# Patient Record
Sex: Female | Born: 1960
Health system: Southern US, Community
[De-identification: ages and names within clinical notes are randomized; demographics above are authoritative.]

## PROBLEM LIST (undated history)

## (undated) DIAGNOSIS — K589 Irritable bowel syndrome without diarrhea: Secondary | ICD-10-CM

## (undated) DIAGNOSIS — J329 Chronic sinusitis, unspecified: Secondary | ICD-10-CM

## (undated) DIAGNOSIS — K8020 Calculus of gallbladder without cholecystitis without obstruction: Secondary | ICD-10-CM

## (undated) DIAGNOSIS — T7840XA Allergy, unspecified, initial encounter: Secondary | ICD-10-CM

## (undated) HISTORY — DX: Allergy, unspecified, initial encounter: T78.40XA

## (undated) HISTORY — DX: Irritable bowel syndrome, unspecified: K58.9

## (undated) HISTORY — DX: Chronic sinusitis, unspecified: J32.9

## (undated) HISTORY — DX: Calculus of gallbladder without cholecystitis without obstruction: K80.20

---

## 1998-09-11 ENCOUNTER — Other Ambulatory Visit: Admission: RE | Admit: 1998-09-11 | Discharge: 1998-09-11 | Payer: Self-pay | Admitting: Obstetrics and Gynecology

## 1999-11-20 ENCOUNTER — Encounter: Payer: Self-pay | Admitting: Orthopaedic Surgery

## 1999-11-20 ENCOUNTER — Ambulatory Visit (HOSPITAL_COMMUNITY): Admission: RE | Admit: 1999-11-20 | Discharge: 1999-11-20 | Payer: Self-pay | Admitting: Orthopaedic Surgery

## 2000-09-19 ENCOUNTER — Other Ambulatory Visit: Admission: RE | Admit: 2000-09-19 | Discharge: 2000-09-19 | Payer: Self-pay | Admitting: Obstetrics and Gynecology

## 2000-10-29 ENCOUNTER — Ambulatory Visit (HOSPITAL_COMMUNITY): Admission: RE | Admit: 2000-10-29 | Discharge: 2000-10-29 | Payer: Self-pay | Admitting: Obstetrics and Gynecology

## 2000-10-29 ENCOUNTER — Encounter: Payer: Self-pay | Admitting: Obstetrics and Gynecology

## 2001-03-24 ENCOUNTER — Other Ambulatory Visit: Admission: RE | Admit: 2001-03-24 | Discharge: 2001-03-24 | Payer: Self-pay | Admitting: Obstetrics and Gynecology

## 2002-01-08 ENCOUNTER — Other Ambulatory Visit: Admission: RE | Admit: 2002-01-08 | Discharge: 2002-01-08 | Payer: Self-pay | Admitting: Obstetrics and Gynecology

## 2002-02-03 ENCOUNTER — Encounter: Payer: Self-pay | Admitting: Obstetrics and Gynecology

## 2002-02-03 ENCOUNTER — Ambulatory Visit (HOSPITAL_COMMUNITY): Admission: RE | Admit: 2002-02-03 | Discharge: 2002-02-03 | Payer: Self-pay | Admitting: Obstetrics and Gynecology

## 2002-09-13 ENCOUNTER — Other Ambulatory Visit: Admission: RE | Admit: 2002-09-13 | Discharge: 2002-09-13 | Payer: Self-pay | Admitting: Obstetrics and Gynecology

## 2003-09-19 ENCOUNTER — Other Ambulatory Visit: Admission: RE | Admit: 2003-09-19 | Discharge: 2003-09-19 | Payer: Self-pay | Admitting: Obstetrics and Gynecology

## 2004-03-13 ENCOUNTER — Ambulatory Visit (HOSPITAL_COMMUNITY): Admission: RE | Admit: 2004-03-13 | Discharge: 2004-03-13 | Payer: Self-pay | Admitting: Obstetrics and Gynecology

## 2004-07-06 ENCOUNTER — Other Ambulatory Visit: Admission: RE | Admit: 2004-07-06 | Discharge: 2004-07-06 | Payer: Self-pay | Admitting: Obstetrics and Gynecology

## 2004-10-24 ENCOUNTER — Ambulatory Visit: Payer: Self-pay | Admitting: Family Medicine

## 2004-11-28 ENCOUNTER — Ambulatory Visit: Payer: Self-pay | Admitting: Family Medicine

## 2004-12-13 ENCOUNTER — Ambulatory Visit: Payer: Self-pay | Admitting: Family Medicine

## 2005-04-03 ENCOUNTER — Other Ambulatory Visit: Admission: RE | Admit: 2005-04-03 | Discharge: 2005-04-03 | Payer: Self-pay | Admitting: Obstetrics and Gynecology

## 2005-05-29 ENCOUNTER — Ambulatory Visit: Payer: Self-pay | Admitting: Family Medicine

## 2005-06-10 ENCOUNTER — Ambulatory Visit: Payer: Self-pay | Admitting: Family Medicine

## 2005-10-16 ENCOUNTER — Ambulatory Visit: Payer: Self-pay | Admitting: Family Medicine

## 2006-04-22 ENCOUNTER — Ambulatory Visit: Payer: Self-pay | Admitting: Family Medicine

## 2006-08-07 ENCOUNTER — Ambulatory Visit: Payer: Self-pay | Admitting: Family Medicine

## 2007-05-08 ENCOUNTER — Telehealth: Payer: Self-pay | Admitting: *Deleted

## 2007-05-13 DIAGNOSIS — J45909 Unspecified asthma, uncomplicated: Secondary | ICD-10-CM | POA: Insufficient documentation

## 2007-05-13 DIAGNOSIS — J309 Allergic rhinitis, unspecified: Secondary | ICD-10-CM | POA: Insufficient documentation

## 2007-07-10 ENCOUNTER — Ambulatory Visit: Payer: Self-pay | Admitting: Family Medicine

## 2008-05-02 ENCOUNTER — Telehealth: Payer: Self-pay | Admitting: Family Medicine

## 2009-07-24 ENCOUNTER — Ambulatory Visit: Payer: Self-pay | Admitting: Family Medicine

## 2009-07-24 DIAGNOSIS — G2581 Restless legs syndrome: Secondary | ICD-10-CM

## 2009-07-25 ENCOUNTER — Telehealth: Payer: Self-pay | Admitting: Family Medicine

## 2009-08-31 ENCOUNTER — Telehealth: Payer: Self-pay | Admitting: Family Medicine

## 2009-11-13 ENCOUNTER — Telehealth: Payer: Self-pay | Admitting: Family Medicine

## 2010-01-15 ENCOUNTER — Ambulatory Visit: Payer: Self-pay | Admitting: Family Medicine

## 2010-04-06 ENCOUNTER — Telehealth: Payer: Self-pay | Admitting: Family Medicine

## 2010-07-10 ENCOUNTER — Telehealth: Payer: Self-pay | Admitting: Family Medicine

## 2010-08-16 ENCOUNTER — Ambulatory Visit: Payer: Self-pay | Admitting: Family Medicine

## 2010-09-14 ENCOUNTER — Telehealth: Payer: Self-pay | Admitting: *Deleted

## 2010-10-02 NOTE — Progress Notes (Signed)
Summary: hydrocodone syrup refill  Phone Note From Pharmacy   Summary of Call: patient is requesting a refill of hydrocodone syrup is this okay to fill? Initial call taken by: Kern Reap CMA Duncan Dull),  April 06, 2010 11:21 AM  Follow-up for Phone Call        Hydromet dispense 4 ounces directions one half to 1 teaspoon nightly p.r.n. for cough, refills x 1 Follow-up by: Roderick Pee MD,  April 06, 2010 11:46 AM    Prescriptions: Heath Lark 5-1.5 MG/5ML SYRP (HYDROCODONE-HOMATROPINE) one - two teaspoons q hs as needed cough  #8oz x 1   Entered by:   Kern Reap CMA (AAMA)   Authorized by:   Roderick Pee MD   Signed by:   Kern Reap CMA (AAMA) on 04/06/2010   Method used:   Historical   RxID:   [id number]

## 2010-10-02 NOTE — Progress Notes (Signed)
Summary: cough syrup  Phone Note Call from Patient   Caller: Patient Call For: Roderick Pee MD Reason for Call: Acute Illness Summary of Call: Pt is complaining of respiratory  alllergies.  Is taking Prednisone that she had left from other bouts, but wants Hydrocodone cough syrup.  [facility]  [phone] Initial call taken by: Lynann Beaver CMA,  November 13, 2009 4:11 PM  Follow-up for Phone Call        Hydromet dispensed 4 ounces directions one teaspoon nightly p.r.n. for cough, refills x 1 Follow-up by: Roderick Pee MD,  November 13, 2009 5:45 PM    Prescriptions: HYDROMET 5-1.5 MG/5ML SYRP (HYDROCODONE-HOMATROPINE) one - two teaspoons q hs as needed cough  #4 oz x 1   Entered by:   Lynann Beaver CMA   Authorized by:   Roderick Pee MD   Signed by:   Lynann Beaver CMA on 11/14/2009   Method used:   Telephoned to ...       [facility]  [place] [address] (retail)       [address]       [place], [place]  [address]       Ph: [phone] or [phone]       Fax: [phone]   RxID:   [id number]  Pt notified.

## 2010-10-02 NOTE — Assessment & Plan Note (Signed)
Summary: ?sinus inf/njr   Vital Signs:  Patient profile:   50 year old female Weight:      164 pounds Temp:     97.9 degrees F oral BP sitting:   120 / 80  (left arm) Cuff size:   regular  Vitals Entered By: Kern Reap CMA Duncan Dull) (Jan 15, 2010 3:43 PM) CC: sinus infection   CC:  sinus infection.  History of Present Illness: Rachel Huffman is a 50 year old, married female, who comes in today for evaluation of allergic rhinitis and cough.  Springtime is always a bad time year for her.  She said her condition and facial discomfort.  She took 6 days of prednisone, but it didn't do any good.  Therefore, stopped it.  She has no fever, but a right-sided earache.  She has a cough, but does not think she is wheezing.  Does not have use her Ventolin.  Allergies: 1)  ! Biaxin (Clarithromycin)  Past History:  Past medical, surgical, family and social histories (including risk factors) reviewed, and no changes noted (except as noted below).  Past Medical History: Reviewed history from 05/13/2007 and no changes required. Allergic rhinitis Asthma  Family History: Reviewed history and no changes required.  Social History: Reviewed history from 07/24/2009 and no changes required. Occupation: Married Never Smoked Alcohol use-yes Drug use-no Regular exercise-yes  Review of Systems      See HPI  Physical Exam  General:  Well-developed,well-nourished,in no acute distress; alert,appropriate and cooperative throughout examination Head:  Normocephalic and atraumatic without obvious abnormalities. No apparent alopecia or balding. Eyes:  No corneal or conjunctival inflammation noted. EOMI. Perrla. Funduscopic exam benign, without hemorrhages, exudates or papilledema. Vision grossly normal. Ears:  External ear exam shows no significant lesions or deformities.  Otoscopic examination reveals clear canals, tympanic membranes are intact bilaterally without bulging, retraction, inflammation or  discharge. Hearing is grossly normal bilaterally. Nose:  septum in the midline, 4+ nasal edema Mouth:  Oral mucosa and oropharynx without lesions or exudates.  Teeth in good repair. Neck:  No deformities, masses, or tenderness noted. Chest Wall:  No deformities, masses, or tenderness noted. Lungs:  Normal respiratory effort, chest expands symmetrically. Lungs are clear to auscultation, no crackles or wheezes.   Impression & Recommendations:  Problem # 1:  ALLERGIC RHINITIS (ICD-477.9) Assessment Deteriorated  Her updated medication list for this problem includes:    Nasonex 50 Mcg/act Susp (Mometasone furoate) .Marland Kitchen... As needed  Complete Medication List: 1)  Loestrin 1.5/30 (21) 1.5-30 Mg-mcg Tabs (Norethindrone acet-ethinyl est) .... Once daily 2)  Nasonex 50 Mcg/act Susp (Mometasone furoate) .... As needed 3)  Albuterol 90 Mcg/act Aers (Albuterol) .... As needed 4)  Prednisone 20 Mg Tabs (Prednisone) .... Uad 5)  Ventolin Hfa 108 (90 Base) Mcg/act Aers (Albuterol sulfate) .... 2 pufffs qid as needed 6)  Requip 0.5 Mg Tabs (Ropinirole hcl) .Marland Kitchen.. 1 tab @ bedtime 7)  Hydromet 5-1.5 Mg/57ml Syrp (Hydrocodone-homatropine) .... One - two teaspoons q hs as needed cough  Patient Instructions: 1)  began the nasal program at bedtime with Afrin followed by saline then the steroid nasal spray.  After 5 nights stop the afrin  but continue the steroid nasal spray in the saline irrigation 2)  Prednisone one tablet x 5 days, one half x 5 days, then one half tablet Monday, Wednesday, Friday, for a two week taper. 3)  Also, one or 2 teaspoons of Hydromet about time for nighttime cough. 4)  Return p.r.n. Prescriptions: HYDROMET 5-1.5 MG/5ML SYRP (  HYDROCODONE-HOMATROPINE) one - two teaspoons q hs as needed cough  #8oz x 1   Entered and Authorized by:   Roderick Pee MD   Signed by:   Roderick Pee MD on 01/15/2010   Method used:   Print then Give to Patient   RxID:   [id number] PREDNISONE 20 MG   TABS (PREDNISONE) UAD  #30 x 1   Entered and Authorized by:   Roderick Pee MD   Signed by:   Roderick Pee MD on 01/15/2010   Method used:   Print then Give to Patient   RxID:   [id number]

## 2010-10-02 NOTE — Progress Notes (Signed)
Summary: hydrocodone refill  Phone Note From Pharmacy   [facility]  [place] [address] Summary of Call: patient is calling for a refill of hydrocodone cough syrup is this okay to fill? Initial call taken by: Kern Reap CMA Duncan Dull),  July 10, 2010 4:54 PM  Follow-up for Phone Call        Hydromet 4 ounces directions one half to 1 teaspoon nightly p.r.n. refills x 1.......... office visit if cough persists Follow-up by: Roderick Pee MD,  July 10, 2010 5:08 PM  Additional Follow-up for Phone Call Additional follow up Details #1::        rx sent Additional Follow-up by: Kern Reap CMA Duncan Dull),  July 11, 2010 11:22 AM

## 2010-10-04 NOTE — Assessment & Plan Note (Signed)
Summary: SINUSITIS? // RS   Vital Signs:  Patient profile:   50 year old female Height:      66 inches Weight:      169 pounds BMI:     27.38 Temp:     98.1 degrees F BP sitting:   120 / 78  (left arm) Cuff size:   regular  Vitals Entered By: Kern Reap CMA Duncan Dull) (August 16, 2010 12:30 PM) CC: cough, pulled muscle in back   CC:  cough and pulled muscle in back.  History of Present Illness: Rachel Huffman is a 50 year old female, married, nonsmoker, who comes in today with a 3 week history of head congestion, cough, wheezing.  She is not sure what triggered her asthma.  Three days ago, it she started prednisone 20 mg daily.  She is also using the albuterol two puffs daily.  She has had a history of allergic rhinitis and asthma in the past.  She's also having some chest pain, right and left upper chest wall.  Review of systems otherwise negative  Allergies: 1)  ! Biaxin (Clarithromycin)  Past History:  Past medical, surgical, family and social histories (including risk factors) reviewed for relevance to current acute and chronic problems.  Past Medical History: Reviewed history from 05/13/2007 and no changes required. Allergic rhinitis Asthma  Family History: Reviewed history and no changes required.  Social History: Reviewed history from 07/24/2009 and no changes required. Occupation: Married Never Smoked Alcohol use-yes Drug use-no Regular exercise-yes  Review of Systems      See HPI  Physical Exam  General:  Well-developed,well-nourished,in no acute distress; alert,appropriate and cooperative throughout examination Head:  Normocephalic and atraumatic without obvious abnormalities. No apparent alopecia or balding. Eyes:  No corneal or conjunctival inflammation noted. EOMI. Perrla. Funduscopic exam benign, without hemorrhages, exudates or papilledema. Vision grossly normal. Ears:  External ear exam shows no significant lesions or deformities.  Otoscopic examination  reveals clear canals, tympanic membranes are intact bilaterally without bulging, retraction, inflammation or discharge. Hearing is grossly normal bilaterally. Nose:  External nasal examination shows no deformity or inflammation. Nasal mucosa are pink and moist without lesions or exudates. Mouth:  Oral mucosa and oropharynx without lesions or exudates.  Teeth in good repair. Neck:  No deformities, masses, or tenderness noted. Chest Wall:  No deformities, masses, or tenderness noted. Lungs:  Normal respiratory effort, chest expands symmetrically. Lungs are clear to auscultation, no crackles ...........late expiratory wheezing   Impression & Recommendations:  Problem # 1:  ASTHMA (ICD-493.90) Assessment Deteriorated  Her updated medication list for this problem includes:    Albuterol 90 Mcg/act Aers (Albuterol) .Marland Kitchen... As needed    Prednisone 20 Mg Tabs (Prednisone) ..... Uad    Ventolin Hfa 108 (90 Base) Mcg/act Aers (Albuterol sulfate) .Marland Kitchen... 2 pufffs qid as needed  Complete Medication List: 1)  Loestrin 1.5/30 (21) 1.5-30 Mg-mcg Tabs (Norethindrone acet-ethinyl est) .... Once daily 2)  Nasonex 50 Mcg/act Susp (Mometasone furoate) .... As needed 3)  Albuterol 90 Mcg/act Aers (Albuterol) .... As needed 4)  Prednisone 20 Mg Tabs (Prednisone) .... Uad 5)  Ventolin Hfa 108 (90 Base) Mcg/act Aers (Albuterol sulfate) .... 2 pufffs qid as needed 6)  Requip 0.5 Mg Tabs (Ropinirole hcl) .Marland Kitchen.. 1 tab @ bedtime 7)  Hydromet 5-1.5 Mg/59ml Syrp (Hydrocodone-homatropine) .... One - two teaspoons q hs as needed cough 8)  Vicodin Es 7.5-750 Mg Tabs (Hydrocodone-acetaminophen) .Marland Kitchen.. 1 tab @ bedtime as needed back pain  Patient Instructions: 1)  take 2  prednisone tablets daily until you are significantly better, then begin a taper by taking one tablet a day, x 3 days, a half a tablet x 3 days, then a half a tablet every other day for two week taper. 2)  Take two Tylenol 3 times a day for your back pain and a  half of a Vicodin at bedtime p.r.n. 3)  Prilosec OTC 20 mg daily while here taking the prednisone. 4)  Albuterol two puffs 3 times a day. 5)  Return p.r.n. Prescriptions: ALBUTEROL 90 MCG/ACT  AERS (ALBUTEROL) as needed  #1 x 3   Entered and Authorized by:   Roderick Pee MD   Signed by:   Roderick Pee MD on 08/16/2010   Method used:   [facility]        [facility]  [place] [address] ([facility]ail[facility]       [address]       [place], [place]  [address]       Ph: [phone] or [phone]       Fax: [phone]   RxID:   [id number] PREDNISONE 20 MG  TABS (PREDNISONE) UAD  #40 x 1   Entered and Authorized by:   Roderick Pee MD   Signed by:   Roderick Pee MD on 08/16/2010   Method used:   [facility]ically [facility]        [facility]  [place] [address] (retail)       [address]       [place], [place]  [address]       Ph: [phone] or [phone]       Fax: [phone]   RxID:   [id number] HYDROMET 5-1.5 MG/5ML SYRP (HYDROCODONE-HOMATROPINE) one - two teaspoons q hs as needed cough  #8oz x 1   Entered and Authorized by:   Roderick Pee MD   Signed by:   Roderick Pee MD on 08/16/2010   Method used:   Print then Give to Patient   RxID:   [id number] VICODIN ES 7.5-750 MG TABS (HYDROCODONE-ACETAMINOPHEN) 1 tab @ bedtime as needed back pain  #30 x 0   Entered and Authorized by:   Roderick Pee MD   Signed by:   Roderick Pee MD on 08/16/2010   Method used:   Print then Give to Patient   RxID:   [id number]    Orders Added: 1)  Est. Patient Level IV [[address]]

## 2010-10-04 NOTE — Progress Notes (Signed)
Summary: refill on hydrocodone/ace  Phone Note From Pharmacy   [facility]  [place] [address] Reason for Call: Needs renewal Details for Reason: hydrocodone/act 7.5/750 Summary of Call: last filled on 08/16/10 #30 Initial call taken by: Romualdo Bolk, CMA (AAMA),  September 14, 2010 1:25 PM  Follow-up for Phone Call        [facility] please call what is  she using the pain pills for?????????? Follow-up by: Roderick Pee MD,  September 14, 2010 1:49 PM  Additional Follow-up for Phone Call Additional follow up Details #1::        Left a message to call back. Additional Follow-up by: Romualdo Bolk, CMA Duncan Dull),  September 14, 2010 2:36 PM    Additional Follow-up for Phone Call Additional follow up Details #2::    Spoke with pt and she is using it for back pain at night. She states her back is getting better but needs one more refill. Romualdo Bolk, CMA Duncan Dull)  September 14, 2010 2:43 PM   Additional Follow-up for Phone Call Additional follow up Details #3:: Details for Additional Follow-up Action Taken: Vicodin ES dispense 30 tabs directions one half tab nightly p.r.n. for back pain, no refills Additional Follow-up by: Roderick Pee MD,  September 14, 2010 3:19 PM  Prescriptions: VICODIN ES 7.5-750 MG TABS (HYDROCODONE-ACETAMINOPHEN) 1 tab @ bedtime as needed back pain  #30 x 0   Entered by:   Romualdo Bolk, CMA (AAMA)   Authorized by:   Roderick Pee MD   Signed by:   Romualdo Bolk, CMA (AAMA) on 09/14/2010   Method used:   Handwritten   RxID:   [id number]   Rx faxed to pharmacy. Romualdo Bolk, CMA (AAMA)  September 14, 2010 3:23 PM

## 2010-10-09 ENCOUNTER — Encounter: Payer: Self-pay | Admitting: Family Medicine

## 2010-10-09 ENCOUNTER — Ambulatory Visit (INDEPENDENT_AMBULATORY_CARE_PROVIDER_SITE_OTHER): Payer: BC Managed Care – PPO | Admitting: Family Medicine

## 2010-10-09 DIAGNOSIS — J0100 Acute maxillary sinusitis, unspecified: Secondary | ICD-10-CM

## 2010-10-09 DIAGNOSIS — J45909 Unspecified asthma, uncomplicated: Secondary | ICD-10-CM

## 2010-10-09 MED ORDER — HYDROCODONE-HOMATROPINE 5-1.5 MG/5ML PO SYRP: 5.0000 mL | ORAL_SOLUTION | Freq: Every evening | ORAL | Status: DC | PRN

## 2010-10-09 MED ORDER — AMOXICILLIN 500 MG PO CAPS: 500.0000 mg | ORAL_CAPSULE | Freq: Three times a day (TID) | ORAL | Status: AC

## 2010-10-09 NOTE — Progress Notes (Signed)
  Subjective:    Patient ID: Aldean Ast, female    DOB: 05-23-1961, 50 y.o.   MRN: [id number]  HPI Akilah is a 50 year old, married female, nonsmoker, who comes in today with a 2 1/2 week history of head congestion, postnasal drip, and cough.  She developed a viral syndrome 2 1/2 weeks ago with a sore throat, etc.  That went away, but the cough has persisted.  She has a lot of head congestion, bilateral maxillary facial pain, and yellow to green sputum production.  She has no fever, chills.  She's been on prednisone 40 mg a day for 5 days with no significant improvement.  Review of systems otherwise negative,,,,,,,,,,No history or symptoms of reflux   Review of Systems Negative    Objective:   Physical Exam    She is a well-developed, well-nourished, female, in no acute distress.  Examination HEENT were negative, except the septum was in the midline.  She has 3 to 4+ nasal edema.  Neck was supple.  No adenopathy.  Lungs were clear except for some very, very minimal late expiratory wheezing    Assessment & Plan:  Sinusitis  Asthma.  Plan begin to taper the prednisone add amoxicillin 500,,,,,,,3 times a day for two weeks.  Hydromet p.r.n. Cough.  Return p.r.n.

## 2010-10-09 NOTE — Patient Instructions (Signed)
30 ounces of water daily.  Vaporizer in your bedroom at night.  Amoxicillin 500 mg 3 times a day for 10 days, refills x 1.  Taper the prednisone by one tab x 3 days, a half x 3 days, then half a tablet Monday, Wednesday, Friday, for a two week taper  Hydromet one half to 1 teaspoon at bedtime p.r.n.  If you don't see any improvement in a week to 10 days.  Call and we will get to set up for a scan of your sinuses

## 2010-11-26 ENCOUNTER — Telehealth: Payer: Self-pay | Admitting: Family Medicine

## 2010-11-26 DIAGNOSIS — J45909 Unspecified asthma, uncomplicated: Secondary | ICD-10-CM

## 2010-11-26 NOTE — Telephone Encounter (Signed)
Pt called and said that [facility] on [address] faxed a refill req last week for Hydromet Syrup for pts cough. Nothing has been called in yet according to pharmacy. Pls call in asap today.  Notify pt when this has been done.

## 2010-11-26 NOTE — Telephone Encounter (Signed)
Hydromet 4 ounces directions one to 0.5-teaspoon nightly p.r.n. For cough.  No refills

## 2010-11-27 NOTE — Telephone Encounter (Signed)
Pt called to check on status of Hydromet syrup. Pt would like to pick this up at [facility] today. Pls call in asap. Pt would like to be notified when this has been done.

## 2010-11-28 MED ORDER — HYDROCODONE-HOMATROPINE 5-1.5 MG/5ML PO SYRP: 5.0000 mL | ORAL_SOLUTION | Freq: Every evening | ORAL | Status: DC | PRN

## 2010-11-28 NOTE — Telephone Encounter (Signed)
Pt call back checking on status of cough med

## 2011-01-07 ENCOUNTER — Telehealth: Payer: Self-pay | Admitting: Family Medicine

## 2011-01-07 NOTE — Telephone Encounter (Signed)
Denial sent to pharmacy. 

## 2011-01-07 NOTE — Telephone Encounter (Signed)
Office visit tomorrow for evaluation of persistent cough

## 2011-01-07 NOTE — Telephone Encounter (Signed)
Pt would like a refill on hycodan cough med [facility] [address] [phone]

## 2011-01-09 ENCOUNTER — Ambulatory Visit (INDEPENDENT_AMBULATORY_CARE_PROVIDER_SITE_OTHER): Payer: BC Managed Care – PPO | Admitting: Family Medicine

## 2011-01-09 ENCOUNTER — Encounter: Payer: Self-pay | Admitting: Family Medicine

## 2011-01-09 DIAGNOSIS — J45909 Unspecified asthma, uncomplicated: Secondary | ICD-10-CM

## 2011-01-09 DIAGNOSIS — J309 Allergic rhinitis, unspecified: Secondary | ICD-10-CM

## 2011-01-09 MED ORDER — MOMETASONE FUROATE 50 MCG/ACT NA SUSP: 2.0000 | Freq: Every day | NASAL | Status: DC

## 2011-01-09 MED ORDER — BECLOMETHASONE DIPROPIONATE 40 MCG/ACT IN AERS: INHALATION_SPRAY | RESPIRATORY_TRACT | Status: DC

## 2011-01-09 MED ORDER — HYDROCODONE-HOMATROPINE 5-1.5 MG/5ML PO SYRP: 5.0000 mL | ORAL_SOLUTION | Freq: Every evening | ORAL | Status: DC | PRN

## 2011-01-09 MED ORDER — MONTELUKAST SODIUM 10 MG PO TABS: 10.0000 mg | ORAL_TABLET | Freq: Every day | ORAL | Status: DC

## 2011-01-09 MED ORDER — ALBUTEROL 90 MCG/ACT IN AERS: 2.0000 | INHALATION_SPRAY | Freq: Four times a day (QID) | RESPIRATORY_TRACT | Status: DC | PRN

## 2011-01-09 NOTE — Progress Notes (Signed)
  Subjective:    Patient ID: Rachel Huffman, female    DOB: 1960-11-28, 50 y.o.   MRN: [id number]  HPI  Rachel Huffman is a 50 year old, married female, nonsmoker, who comes in today because her as has gotten worse.  She did not develop asthma and so she was an adult.  Her symptoms are worse in the spring time.  She did have an immunologic workup, which showed spring, grasses pollinating trees etc., were very allergenic, and she did be allergy shots for one year plus with no change in symptoms.  The cough now is so bad she can't sleep at night, and she is having to use more than one canister of albuterol and a 50 month period of time.  She also sneezing had congestion for which she uses a steroid nasal spray.  We spent a fair amount of time discussing the criteria for treating asthma,,,,,,,,, [facility]    Review of Systems General allergy, and pulmonary venous systems otherwise negative    Objective:   Physical Exam    Well-developed well-nourished, female, in no acute distress.  HEENT negative except for 3+ nasal edema.  Neck is supple.  Lungs are clear except for expiratory wheezing bilaterally symmetrical    Assessment & Plan:  Adult asthma,,,,,,,,,,, we will start an aggressive treatment program, which will include Claritin, 10 mg daily, Singulair, 10 mg daily, Qvar 41 puff b.i.d., albuterol one to two puffs t.i.d., p.r.n., patient requesting cough syrup to sleep at night.  Hydromet one half to 1 teaspoon at bedtime.  Follow-up in 3 weeks

## 2011-01-09 NOTE — Patient Instructions (Signed)
Claritin 10 mg plain  daily.  Singulair 10 mg q.a.m.  Qvar one puff twice daily.  Albuterol one puff 3 times a day p.r.n.  Hydromet  0.5-teaspoon at bedtime p.r.n.  Return in 3 weeks for follow-up, sooner if any problems  Steroid nasal spray, one shot up each nostril at bedtime

## 2011-01-10 NOTE — Telephone Encounter (Addendum)
lmom for pt to call back. Pt was seen on 01-09-2011

## 2011-01-30 ENCOUNTER — Ambulatory Visit: Payer: BC Managed Care – PPO | Admitting: Family Medicine

## 2011-03-12 ENCOUNTER — Other Ambulatory Visit: Payer: Self-pay | Admitting: *Deleted

## 2011-03-12 MED ORDER — BENZONATATE 100 MG PO CAPS: ORAL_CAPSULE | ORAL | Status: DC

## 2011-03-12 NOTE — Telephone Encounter (Signed)
hydromet declined per dr todd.  rx for tessalon pearls sent.

## 2011-03-18 ENCOUNTER — Telehealth: Payer: Self-pay | Admitting: *Deleted

## 2011-03-18 NOTE — Telephone Encounter (Signed)
patient  Is calling because the new Rx is not working for the cough.  I advised her to make an office visit.

## 2011-05-02 ENCOUNTER — Other Ambulatory Visit: Payer: Self-pay | Admitting: Obstetrics and Gynecology

## 2011-05-02 DIAGNOSIS — R928 Other abnormal and inconclusive findings on diagnostic imaging of breast: Secondary | ICD-10-CM

## 2011-05-07 ENCOUNTER — Ambulatory Visit
Admission: RE | Admit: 2011-05-07 | Discharge: 2011-05-07 | Disposition: A | Discharge status: Home / Self Care | Payer: BC Managed Care – PPO | Source: Ambulatory Visit | Attending: Obstetrics and Gynecology | Admitting: Obstetrics and Gynecology

## 2011-05-07 ENCOUNTER — Telehealth: Payer: Self-pay | Admitting: *Deleted

## 2011-05-07 DIAGNOSIS — R928 Other abnormal and inconclusive findings on diagnostic imaging of breast: Secondary | ICD-10-CM

## 2011-05-07 MED ORDER — HYDROCODONE-ACETAMINOPHEN 7.5-750 MG PO TABS: 1.0000 | ORAL_TABLET | Freq: Four times a day (QID) | ORAL | Status: AC | PRN

## 2011-05-07 NOTE — Telephone Encounter (Signed)
patient  Is calling because she has had a repeat breast exam done today.  She is having some tenderness and pain due to this.  The nurse at the breast center suggested that she call here PCP for pain med.  She has taken Vicodin before and would like to know if she can have a prescription just for a few days?

## 2011-05-07 NOTE — Telephone Encounter (Signed)
rx called in and patient is aware 

## 2011-05-07 NOTE — Telephone Encounter (Signed)
rx sent

## 2011-05-07 NOTE — Telephone Encounter (Signed)
20

## 2011-06-12 ENCOUNTER — Telehealth: Payer: Self-pay | Admitting: *Deleted

## 2011-06-12 MED ORDER — HYDROCODONE-HOMATROPINE 5-1.5 MG/5ML PO SYRP: 2.5000 mL | ORAL_SOLUTION | Freq: Every evening | ORAL | Status: DC | PRN

## 2011-06-12 NOTE — Telephone Encounter (Signed)
Hydromet 4 ounces directions 0.5-teaspoon nightly p.r.n. Cough.  No refill is.  If cough persists, then she needs an office visit

## 2011-06-12 NOTE — Telephone Encounter (Signed)
Pt is complaining of a dry cough with headaches and seasonal allergy symptoms.  She is going to take the Prednisone that Dr. Tawanna Cooler gave her before, but would like a cough med at night, please.  Tessalon does not work.

## 2011-07-04 ENCOUNTER — Other Ambulatory Visit: Payer: Self-pay | Admitting: Family Medicine

## 2011-07-05 NOTE — Telephone Encounter (Signed)
Refill  hydromet syrup to [facility] [address]. Thanks.

## 2011-10-29 ENCOUNTER — Telehealth: Payer: Self-pay | Admitting: Family Medicine

## 2011-10-29 NOTE — Telephone Encounter (Signed)
Pt req work in ov with pcp tomorrow 10/30/11 re: allergies and cough. Pls advise.

## 2011-10-30 ENCOUNTER — Ambulatory Visit (INDEPENDENT_AMBULATORY_CARE_PROVIDER_SITE_OTHER): Payer: Self-pay | Admitting: Family Medicine

## 2011-10-30 ENCOUNTER — Encounter: Payer: Self-pay | Admitting: Family Medicine

## 2011-10-30 DIAGNOSIS — J309 Allergic rhinitis, unspecified: Secondary | ICD-10-CM

## 2011-10-30 DIAGNOSIS — J45909 Unspecified asthma, uncomplicated: Secondary | ICD-10-CM

## 2011-10-30 MED ORDER — MONTELUKAST SODIUM 10 MG PO TABS: 10.0000 mg | ORAL_TABLET | Freq: Every day | ORAL | Status: DC

## 2011-10-30 MED ORDER — BENZONATATE 100 MG PO CAPS: 100.0000 mg | ORAL_CAPSULE | Freq: Three times a day (TID) | ORAL | Status: DC | PRN

## 2011-10-30 MED ORDER — HYDROCODONE-HOMATROPINE 5-1.5 MG/5ML PO SYRP: ORAL_SOLUTION | ORAL | Status: DC

## 2011-10-30 MED ORDER — PREDNISONE 20 MG PO TABS: ORAL_TABLET | ORAL | Status: DC

## 2011-10-30 NOTE — Progress Notes (Signed)
  Subjective:    Patient ID: Rachel Huffman, female    DOB: 11/25/1960, 51 y.o.   MRN: [id number]  HPI Rachel Huffman is a 51 year old female married nonsmoker who comes in today for evaluation of asthma  She states about a week ago they were in a hotel room in the cold air triggered her asthma and wheezing. She's been taking albuterol 2 puffs twice daily but hasn't helped. She's also not taking her Singulair nor antihistamines     Review of Systems General and pulmonary review of systems otherwise negative her last asthma attack was about 6 months ago she took a short course of Qvar and it helped.    Objective:   Physical Exam  Well-developed well-nourished female in no acute distress HEENT negative neck was supple no adenopathy lungs are clear no wheezing      Assessment & Plan:  Reactive airway disease plan prednisone burst and taper Tessalon for cough suppressant during the day, Hydromet 1/2 teaspoon at bedtime, albuterol when necessary, restart Singulair, followup in 2 weeks

## 2011-10-30 NOTE — Patient Instructions (Signed)
Take the prednisone as directed  Drink lots of water  Vaporizer in her bedroom at night  Hydromet 1/2 teaspoon at bedtime  Tessalon 3-4 times daily as needed for cough during the day  Restart the Singulair 1 daily  Zyrtec plain 10 mg at bedtime  Return in 2 weeks for followup sooner if any problems

## 2011-11-14 ENCOUNTER — Ambulatory Visit: Payer: Self-pay | Admitting: Family Medicine

## 2011-11-19 ENCOUNTER — Other Ambulatory Visit: Payer: Self-pay | Admitting: Family Medicine

## 2011-11-19 NOTE — Telephone Encounter (Signed)
ok 

## 2011-12-24 ENCOUNTER — Other Ambulatory Visit: Payer: Self-pay | Admitting: Family Medicine

## 2012-01-22 ENCOUNTER — Other Ambulatory Visit: Payer: Self-pay | Admitting: *Deleted

## 2012-01-22 MED ORDER — ALBUTEROL SULFATE (2.5 MG/3ML) 0.083% IN NEBU: 2.5000 mg | INHALATION_SOLUTION | Freq: Four times a day (QID) | RESPIRATORY_TRACT | Status: DC | PRN

## 2012-01-23 ENCOUNTER — Other Ambulatory Visit: Payer: Self-pay | Admitting: Family Medicine

## 2012-01-28 ENCOUNTER — Other Ambulatory Visit: Payer: Self-pay | Admitting: *Deleted

## 2012-01-28 MED ORDER — ALBUTEROL SULFATE HFA 108 (90 BASE) MCG/ACT IN AERS: 2.0000 | INHALATION_SPRAY | Freq: Four times a day (QID) | RESPIRATORY_TRACT | Status: DC | PRN

## 2012-03-19 ENCOUNTER — Other Ambulatory Visit: Payer: Self-pay | Admitting: Family Medicine

## 2012-03-19 DIAGNOSIS — J45909 Unspecified asthma, uncomplicated: Secondary | ICD-10-CM

## 2012-03-19 DIAGNOSIS — J309 Allergic rhinitis, unspecified: Secondary | ICD-10-CM

## 2012-03-19 MED ORDER — BENZONATATE 100 MG PO CAPS: 100.0000 mg | ORAL_CAPSULE | Freq: Three times a day (TID) | ORAL | Status: DC | PRN

## 2012-03-19 NOTE — Telephone Encounter (Signed)
Tessalon Perles 200 mg #50 directions 1-4 times a day as needed for cough refills x2

## 2012-03-19 NOTE — Telephone Encounter (Signed)
Rx sent to pharmacy   

## 2012-03-19 NOTE — Telephone Encounter (Signed)
Caller: Rachel Huffman/Patient; PCP: Roderick Pee.; CB#: [phone]; ; ; Call regarding Cough Thought To Be Allergy Related; Cough Onset 03/17/12. Denies breathing diff. or fever. Cough is dry cough and mostly at night. Interrupting callers' sleep.  All emergent sxs. ruled out per Cough guideline with exception "hx. of seasonal allergies and similar sxs."  CALLER IS LEAVING ON A FLIGHT AT 6AM TOMORROW AND IS REQUESTING PRESCRIPTION FOR COUGH MED (unknown by caller) THAT SHE HAS USED IN THE PAST TO TX. ALLERGY COUGH SXS. TO BE CALLED TO  Pharmacy - [facility] [address] [phone].  STATES SHE HAS ENOUGH ALBUTEROL INHALER AND PREDNISONE ON HAND.

## 2012-04-06 ENCOUNTER — Telehealth: Payer: Self-pay | Admitting: Family Medicine

## 2012-04-06 ENCOUNTER — Ambulatory Visit (INDEPENDENT_AMBULATORY_CARE_PROVIDER_SITE_OTHER)
Admission: RE | Admit: 2012-04-06 | Discharge: 2012-04-06 | Disposition: A | Discharge status: Home / Self Care | Payer: 59 | Source: Ambulatory Visit | Attending: Family Medicine | Admitting: Family Medicine

## 2012-04-06 ENCOUNTER — Ambulatory Visit (INDEPENDENT_AMBULATORY_CARE_PROVIDER_SITE_OTHER): Payer: 59 | Admitting: Family Medicine

## 2012-04-06 ENCOUNTER — Other Ambulatory Visit: Payer: Self-pay | Admitting: Family Medicine

## 2012-04-06 ENCOUNTER — Encounter: Payer: Self-pay | Admitting: Family Medicine

## 2012-04-06 VITALS — BP 120/80

## 2012-04-06 DIAGNOSIS — M25579 Pain in unspecified ankle and joints of unspecified foot: Secondary | ICD-10-CM

## 2012-04-06 DIAGNOSIS — M25571 Pain in right ankle and joints of right foot: Secondary | ICD-10-CM

## 2012-04-06 DIAGNOSIS — M722 Plantar fascial fibromatosis: Secondary | ICD-10-CM

## 2012-04-06 DIAGNOSIS — M659 Synovitis and tenosynovitis, unspecified: Secondary | ICD-10-CM

## 2012-04-06 DIAGNOSIS — M7751 Other enthesopathy of right foot: Secondary | ICD-10-CM | POA: Insufficient documentation

## 2012-04-06 MED ORDER — TRAMADOL HCL 50 MG PO TABS: ORAL_TABLET | ORAL | Status: DC

## 2012-04-06 NOTE — Telephone Encounter (Signed)
Pt hurt her foot and ankle helping her daughter move. Pt states that here is no swelling but has radiating pain from her foot up through her shin. Pt would like to be seen today, please advise

## 2012-04-06 NOTE — Patient Instructions (Addendum)
Motrin 400 mg 3 times daily with food  Elevation and ice 30 minutes 4 times daily  Do the stretching exercises before you get out of bed in the morning  Return when necessary

## 2012-04-06 NOTE — Progress Notes (Signed)
  Subjective:    Patient ID: Rachel Huffman, female    DOB: 04/21/1961, 51 y.o.   MRN: [id number]  HPI Rachel Huffman is a 51 year old married female nonsmoker who comes in today for evaluation of pain in her right ankle  She states last Friday she was helping her daughter move was doing a lot of climbing and later on that evening developed pain in her right ankle. She points to a tendon and the plantar area as a source for pain. No history of trauma.   Review of Systems    general and orthopedic review of systems otherwise negative Objective:   Physical Exam  Well-developed well-nourished female no acute distress examination foot shows palpable tenderness the plantar fascia and on the anterior ligament      Assessment & Plan:  Plantar fasciitis and tendinitis right foot Motrin 400 twice a day elevation and ice return when necessary

## 2012-04-06 NOTE — Telephone Encounter (Signed)
I advised Rachel Huffman to call her and tell her to come over now

## 2012-06-05 ENCOUNTER — Other Ambulatory Visit: Payer: Self-pay | Admitting: *Deleted

## 2012-06-05 DIAGNOSIS — M722 Plantar fascial fibromatosis: Secondary | ICD-10-CM

## 2012-06-05 DIAGNOSIS — M7751 Other enthesopathy of right foot: Secondary | ICD-10-CM

## 2012-06-05 MED ORDER — TRAMADOL HCL 50 MG PO TABS: ORAL_TABLET | ORAL | Status: DC

## 2012-09-07 ENCOUNTER — Other Ambulatory Visit: Payer: Self-pay | Admitting: Family Medicine

## 2012-09-14 LAB — HM PAP SMEAR: HM Pap smear: NEGATIVE

## 2012-10-19 ENCOUNTER — Emergency Department (HOSPITAL_COMMUNITY)
Admission: EM | Admit: 2012-10-19 | Discharge: 2012-10-19 | Disposition: A | Discharge status: Home / Self Care | Payer: 59 | Attending: Emergency Medicine | Admitting: Emergency Medicine

## 2012-10-19 ENCOUNTER — Telehealth: Payer: Self-pay | Admitting: Family Medicine

## 2012-10-19 ENCOUNTER — Emergency Department (HOSPITAL_COMMUNITY): Payer: 59

## 2012-10-19 ENCOUNTER — Encounter (HOSPITAL_COMMUNITY): Payer: Self-pay | Admitting: Emergency Medicine

## 2012-10-19 DIAGNOSIS — Z3202 Encounter for pregnancy test, result negative: Secondary | ICD-10-CM | POA: Insufficient documentation

## 2012-10-19 DIAGNOSIS — J45909 Unspecified asthma, uncomplicated: Secondary | ICD-10-CM | POA: Insufficient documentation

## 2012-10-19 DIAGNOSIS — R197 Diarrhea, unspecified: Secondary | ICD-10-CM | POA: Insufficient documentation

## 2012-10-19 DIAGNOSIS — A088 Other specified intestinal infections: Secondary | ICD-10-CM | POA: Insufficient documentation

## 2012-10-19 DIAGNOSIS — R1110 Vomiting, unspecified: Secondary | ICD-10-CM | POA: Insufficient documentation

## 2012-10-19 DIAGNOSIS — K529 Noninfective gastroenteritis and colitis, unspecified: Secondary | ICD-10-CM

## 2012-10-19 DIAGNOSIS — Z8719 Personal history of other diseases of the digestive system: Secondary | ICD-10-CM | POA: Insufficient documentation

## 2012-10-19 LAB — CBC WITH DIFFERENTIAL/PLATELET
HCT: 44 % (ref 36.0–46.0)
Hemoglobin: 15.8 g/dL — ABNORMAL HIGH (ref 12.0–15.0)
Lymphocytes Relative: 3 % — ABNORMAL LOW (ref 12–46)
Lymphs Abs: 0.4 10*3/uL — ABNORMAL LOW (ref 0.7–4.0)
MCHC: 35.9 g/dL (ref 30.0–36.0)
Monocytes Absolute: 0.5 10*3/uL (ref 0.1–1.0)
Monocytes Relative: 4 % (ref 3–12)
Neutro Abs: 11.7 10*3/uL — ABNORMAL HIGH (ref 1.7–7.7)
Neutrophils Relative %: 93 % — ABNORMAL HIGH (ref 43–77)
RBC: 5.33 MIL/uL — ABNORMAL HIGH (ref 3.87–5.11)

## 2012-10-19 LAB — COMPREHENSIVE METABOLIC PANEL
Albumin: 3.9 g/dL (ref 3.5–5.2)
Alkaline Phosphatase: 111 U/L (ref 39–117)
BUN: 19 mg/dL (ref 6–23)
CO2: 20 mEq/L (ref 19–32)
Chloride: 100 mEq/L (ref 96–112)
Creatinine, Ser: 0.89 mg/dL (ref 0.50–1.10)
GFR calc non Af Amer: 74 mL/min — ABNORMAL LOW (ref >90)
Glucose, Bld: 155 mg/dL — ABNORMAL HIGH (ref 70–99)
Potassium: 4.2 mEq/L (ref 3.5–5.1)
Total Bilirubin: 1.2 mg/dL (ref 0.3–1.2)

## 2012-10-19 LAB — URINALYSIS, ROUTINE W REFLEX MICROSCOPIC
Glucose, UA: NEGATIVE mg/dL
Protein, ur: NEGATIVE mg/dL
Urobilinogen, UA: 0.2 mg/dL (ref 0.0–1.0)

## 2012-10-19 LAB — URINE MICROSCOPIC-ADD ON

## 2012-10-19 MED ORDER — MORPHINE SULFATE 4 MG/ML IJ SOLN
8.0000 mg | Freq: Once | INTRAMUSCULAR | Status: AC
  Administered 2012-10-19: 8 mg via INTRAVENOUS
  Filled 2012-10-19: qty 2

## 2012-10-19 MED ORDER — SODIUM CHLORIDE 0.9 % IV SOLN
INTRAVENOUS | Status: DC
  Administered 2012-10-19: 03:00:00 via INTRAVENOUS

## 2012-10-19 MED ORDER — ONDANSETRON HCL 4 MG/2ML IJ SOLN
4.0000 mg | Freq: Once | INTRAMUSCULAR | Status: AC
  Administered 2012-10-19: 4 mg via INTRAVENOUS
  Filled 2012-10-19: qty 2

## 2012-10-19 MED ORDER — SODIUM CHLORIDE 0.9 % IV BOLUS (SEPSIS)
1000.0000 mL | Freq: Once | INTRAVENOUS | Status: AC
  Administered 2012-10-19: 1000 mL via INTRAVENOUS

## 2012-10-19 MED ORDER — IOHEXOL 300 MG/ML  SOLN
100.0000 mL | Freq: Once | INTRAMUSCULAR | Status: AC | PRN
  Administered 2012-10-19: 100 mL via INTRAVENOUS

## 2012-10-19 MED ORDER — PERCOCET 5-325 MG PO TABS: 1.0000 | ORAL_TABLET | Freq: Four times a day (QID) | ORAL | Status: DC | PRN

## 2012-10-19 MED ORDER — ONDANSETRON 4 MG PO TBDP: 4.0000 mg | ORAL_TABLET | Freq: Four times a day (QID) | ORAL | Status: DC | PRN

## 2012-10-19 MED ORDER — IOHEXOL 300 MG/ML  SOLN
50.0000 mL | Freq: Once | INTRAMUSCULAR | Status: AC | PRN
  Administered 2012-10-19: 50 mL via ORAL

## 2012-10-19 NOTE — ED Notes (Addendum)
Pt states she has had abdominal pain in lower quadrants for 6 hrs along w/ NVD. States it is intermittent. Hx of ulcerative proctitis but feels that this is not like that pain.

## 2012-10-19 NOTE — ED Notes (Signed)
[id number]<AV> Expected date:<BR> Expected time:<BR> Means of arrival:<BR> Comments:<BR> Medic 15, 51 F, Abd Pain

## 2012-10-19 NOTE — Telephone Encounter (Signed)
[facility] Triage Call Report Triage Record Num: [id number] Operator: Kelle Darting Patient Name: Rachel Huffman Call Date & Time: 10/19/2012 1:39:27AM Patient Phone: [phone] PCP: Eugenio Hoes. Todd Patient Gender: Female PCP Fax : [phone] Patient DOB: [birth year] [facility] Name: [facility] Reason for Call: Caller: Jeff/Spouse; PCP: Kelle Darting [facility]); CB#: [phone]; Call regarding Abdominal cramping, vomiting, diarrhea; LMP: 09/16/12 (stopped taking birth control pills at that time); Afebrile; Onset: 10/18/12 at 2030; Sx notes: Diarrhea that started suddenly at 2030, then developed into vomiting at 2200, has been going on for 5 hours now; at this time patient is resting, skin color normal, dry and warm, sharp cramping in abdomen gets worse after a stool, pain is getting worse, is almost unbearable; Guideline used: Abdominal pain; Disposition: See ED Immediately due to abdominal pain that has steadily worsened over hours AND vomiting started after the pain; Appt. made: No; Husband taking patient to [facility] ED for evaluation. Protocol(s) Used: Abdominal Pain Recommended Outcome per Protocol: See ED Immediately Reason for Outcome: Abdominal pain that has steadily worsened over hours OR has been continuous for 3 hours or more AND any of the following: loss of appetite, vomiting starting after pain, any fever, OR unable to carry out normal activities Care Advice: ~ 02/

## 2012-10-19 NOTE — ED Provider Notes (Signed)
Medical screening examination/treatment/procedure(s) were performed by non-physician practitioner and as supervising physician I was immediately available for consultation/collaboration.   Lyanne Co, MD 10/19/12 [phone]

## 2012-10-19 NOTE — ED Provider Notes (Signed)
History     CSN: [id number]  Arrival date & time 10/19/12  1308   First MD Initiated Contact with Patient 10/19/12 [phone]      Chief Complaint  Patient presents with  . Abdominal Pain    (Consider location/radiation/quality/duration/timing/severity/associated sxs/prior treatment) HPI Comments: Pt with a hx of ulcerative proctitis (Last flare up 25 years ago, no local GI specialist in the area) presents to the ER c/o of severe abdominal pain. Symptom onset began at 8pm (7hrs prior to arrival) with explossive non bloody diarrhea. Since then pt has been having intermittent emesis and diarrhea with gradually worsening abdominal pain. Pain is rated as severe, 10/10, generalized in location and non radiating. Pain has been constant since onset and symptoms are moderate. Non known exacerbating or alleviating factors.  Pt denies a history of abdominal surgery, hematemesis, hematochezia, melena, dysuria, hematuria, vaginal dc, fevers, NS, or chills.    Patient is a 52 y.o. female presenting with abdominal pain. The history is provided by the patient.  Abdominal Pain Associated symptoms: diarrhea and vomiting   Associated symptoms: no chest pain, no chills, no dysuria, no fever and no shortness of breath     Past Medical History  Diagnosis Date  . Allergy   . Asthma   . Ulcerative (chronic) proctitis     No past surgical history on file.  No family history on file.  History  Substance Use Topics  . Smoking status: Never Smoker   . Smokeless tobacco: Not on file  . Alcohol Use: Yes    OB History   Grav Para Term Preterm Abortions TAB SAB Ect Mult Living                  Review of Systems  Constitutional: Negative for fever, chills and appetite change.  HENT: Negative for congestion.   Eyes: Negative for visual disturbance.  Respiratory: Negative for shortness of breath.   Cardiovascular: Negative for chest pain and leg swelling.  Gastrointestinal: Positive for vomiting,  abdominal pain and diarrhea.  Genitourinary: Negative for dysuria, urgency and frequency.  Neurological: Negative for dizziness, syncope, weakness, light-headedness, numbness and headaches.  Psychiatric/Behavioral: Negative for confusion.  All other systems reviewed and are negative.    Allergies  Clarithromycin  Home Medications   Current Outpatient Rx  Name  Route  Sig  Dispense  Refill  . norethindrone-ethinyl estradiol (MICROGESTIN 1/20) 1-20 MG-MCG per tablet   Oral   Take 1 tablet by mouth daily.             BP 166/94  Pulse 108  Temp(Src) 98.3 F (36.8 C) (Oral)  Resp 16  SpO2 98%  Physical Exam  Nursing note and vitals reviewed. Constitutional: Vital signs are normal. She appears well-developed and well-nourished. No distress.  HENT:  Head: Normocephalic and atraumatic.  Mouth/Throat: Uvula is midline, oropharynx is clear and moist and mucous membranes are normal.  Eyes: Conjunctivae and EOM are normal. Pupils are equal, round, and reactive to light.  Neck: Normal range of motion and full passive range of motion without pain. Neck supple. No spinous process tenderness and no muscular tenderness present. No rigidity. No Brudzinski's sign noted.  Cardiovascular: Normal rate and regular rhythm.   Pulmonary/Chest: Effort normal and breath sounds normal. No accessory muscle usage. Not tachypneic. No respiratory distress.  Abdominal: Soft. Normal appearance. She exhibits no distension, no ascites, no pulsatile midline mass and no mass. There is tenderness (diffuse, no localization). There is no CVA tenderness. No  hernia.  Lymphadenopathy:    She has no cervical adenopathy.  Neurological: She is alert.  Skin: Skin is warm and dry. No rash noted. She is not diaphoretic.  Psychiatric: She has a normal mood and affect. Her speech is normal and behavior is normal.    ED Course  Procedures (including critical care time)  Labs Reviewed  CBC WITH DIFFERENTIAL - Abnormal;  Notable for the following:    WBC 12.7 (*)    RBC 5.33 (*)    Hemoglobin 15.8 (*)    Neutrophils Relative 93 (*)    Neutro Abs 11.7 (*)    Lymphocytes Relative 3 (*)    Lymphs Abs 0.4 (*)    All other components within normal limits  COMPREHENSIVE METABOLIC PANEL - Abnormal; Notable for the following:    Glucose, Bld 155 (*)    GFR calc non Af Amer 74 (*)    GFR calc Af Amer 86 (*)    All other components within normal limits  URINALYSIS, ROUTINE W REFLEX MICROSCOPIC   Ct Abdomen Pelvis W Contrast  10/19/2012  *RADIOLOGY REPORT*  Clinical Data: Abdominal pain  CT ABDOMEN AND PELVIS WITH CONTRAST  Technique:  Multidetector CT imaging of the abdomen and pelvis was performed following the standard protocol during bolus administration of intravenous contrast.  Contrast: OMNIPAQUE IOHEXOL 300 MG/ML  SOLN  Comparison: None.  Findings: 4 mm subpleural nodule right middle lobe on image 1N. Normal heart size.  No pleural or pericardial effusion.  Multiple hepatic hypodensities are favored to reflect cysts. Gallstones.  No biliary ductal dilatation.  Unremarkable spleen, pancreas, adrenal glands.  Symmetric renal enhancement with a couple tiny hypodensities bilaterally.  No hydronephrosis or hydroureter.  No CT evidence for colitis.  Normal appendix.  Small bowel loops are normal course and caliber.  No free intraperitoneal air or fluid.  No lymphadenopathy.  Mild scattered atherosclerotic disease of the aorta without aneurysmal dilatation.  Fibroid uterus.  Thin-walled bladder.  No acute osseous finding.  IMPRESSION: No acute abdominopelvic process identified by CT.  Fibroid uterus.  4 mm right middle lobe nodule. If the patient is at high risk for bronchogenic carcinoma, follow-up chest CT at 1 year is recommended.  If the patient is at low risk, no follow-up is needed.  This recommendation follows the consensus statement: Guidelines for Management of Small Pulmonary Nodules Detected on CT Scans:  A  Statement from the Fleischner Society as published in Radiology 2005; 237:395-400.  Gallstones.   Original Report Authenticated By: Jearld Lesch, M.D.      No diagnosis found.   MDM  Viral gastroenteritis  Patient with symptoms consistent with viral gastroenteritis.  Vitals are stable, no fever.  No signs of dehydration, tolerating PO fluids > 6 oz.  Lungs are clear.  No focal abdominal pain, no concern for appendicitis, cholecystitis, pancreatitis, ruptured viscus, UTI , kidney stone, or any other abdominal etiology.  Supportive therapy indicated with return if symptoms worsen.  Patient counseled.         Jaci Carrel, [place] 10/19/12 0602

## 2012-12-12 ENCOUNTER — Other Ambulatory Visit: Payer: Self-pay | Admitting: Family Medicine

## 2013-03-11 ENCOUNTER — Telehealth: Payer: Self-pay | Admitting: Family Medicine

## 2013-03-11 MED ORDER — PROMETHAZINE HCL 25 MG RE SUPP: 25.0000 mg | Freq: Four times a day (QID) | RECTAL | Status: DC | PRN

## 2013-03-11 NOTE — Telephone Encounter (Signed)
Rx sent and patient is aware. 

## 2013-03-11 NOTE — Telephone Encounter (Signed)
Caller: Haydan/Patient; Phone: [phone]; Reason for Call: Pt requesting Phenergan suppository RX to take on long trip.  Pt is asymptomatic at time of call.  Pt last OV was 04-06-12.  Pt leaves on 7-14 for trip.  Pt uses [facility] on [address], info in [place].  PLEASE REVIEW W/ MD AND F/U W/ PT.

## 2013-03-28 ENCOUNTER — Other Ambulatory Visit: Payer: Self-pay | Admitting: Family Medicine

## 2013-03-29 ENCOUNTER — Ambulatory Visit (INDEPENDENT_AMBULATORY_CARE_PROVIDER_SITE_OTHER): Payer: 59 | Admitting: Family Medicine

## 2013-03-29 ENCOUNTER — Encounter: Payer: Self-pay | Admitting: Family Medicine

## 2013-03-29 VITALS — BP 124/80 | Temp 98.9°F | Wt 176.0 lb

## 2013-03-29 DIAGNOSIS — M659 Synovitis and tenosynovitis, unspecified: Secondary | ICD-10-CM

## 2013-03-29 DIAGNOSIS — M722 Plantar fascial fibromatosis: Secondary | ICD-10-CM

## 2013-03-29 DIAGNOSIS — M7751 Other enthesopathy of right foot: Secondary | ICD-10-CM

## 2013-03-29 MED ORDER — TRAMADOL HCL 50 MG PO TABS: 50.0000 mg | ORAL_TABLET | Freq: Three times a day (TID) | ORAL | Status: DC | PRN

## 2013-03-29 NOTE — Progress Notes (Signed)
Chief Complaint  Patient presents with  . medicaiton refill    needs tramadol refill     HPI:  52 yo F pt of Dr. Nelida Meuse here for med check:  R recurring plantar fasciitis/tendonitis: -doing well - saw PT and and working out and doing better -on hard workout days still has some pain and uses tramadol for this -denies: worsening, fevers, weakness or numbness ROS: See pertinent positives and negatives per HPI.  Past Medical History  Diagnosis Date  . Allergy   . Asthma   . Ulcerative (chronic) proctitis     No family history on file.  History   Social History  . Marital Status: Married    Spouse Name: N/A    Number of Children: N/A  . Years of Education: N/A   Social History Main Topics  . Smoking status: Never Smoker   . Smokeless tobacco: None  . Alcohol Use: Yes  . Drug Use: No  . Sexually Active: None   Other Topics Concern  . None   Social History Narrative  . None    Current outpatient prescriptions:norethindrone-ethinyl estradiol (MICROGESTIN 1/20) 1-20 MG-MCG per tablet, Take 1 tablet by mouth daily.  , Disp: , Rfl: ;  ondansetron (ZOFRAN ODT) 4 MG disintegrating tablet, Take 1 tablet (4 mg total) by mouth every 6 (six) hours as needed for nausea., Disp: 6 tablet, Rfl: 0;  PERCOCET 5-325 MG per tablet, Take 1 tablet by mouth every 6 (six) hours as needed for pain., Disp: 15 tablet, Rfl: 0 promethazine (PHENERGAN) 25 MG suppository, Place 1 suppository (25 mg total) rectally every 6 (six) hours as needed for nausea., Disp: 6 each, Rfl: 0;  traMADol (ULTRAM) 50 MG tablet, Take 1 tablet (50 mg total) by mouth every 8 (eight) hours as needed for pain., Disp: 30 tablet, Rfl: 1  EXAM:  Filed Vitals:   03/29/13 1459  BP: 124/80  Temp: 98.9 F (37.2 C)    Body mass index is 28.42 kg/(m^2).  GENERAL: vitals reviewed and listed above, alert, oriented, appears well hydrated and in no acute distress  HEENT: atraumatic, conjunttiva clear, no obvious  abnormalities on inspection of external nose and ears  NECK: no obvious masses on inspection  LUNGS: clear to auscultation bilaterally, no wheezes, rales or rhonchi, good air movement  CV: HRRR, no peripheral edema  MS: moves all extremities without noticeable abnormality -Gait normal, normal appearance of feet and ankles, no swelling, no bony TTP, neg talar tilt and ant drawer and no tarsal tunnel findings.a little TTP R  plantar fascia and R ant tibialis  PSYCH: pleasant and cooperative, no obvious depression or anxiety  ASSESSMENT AND PLAN:  Discussed the following assessment and plan:  Plantar fasciitis of right foot - Plan: traMADol (ULTRAM) 50 MG tablet  Tendinitis of right ankle - Plan: traMADol (ULTRAM) 50 MG tablet  -refilled tramadol to use sparingly -advised if continued issues to see PCP/sports medicine - offered sports med referral today but she would like to hold of on this until talks with PCP -[place] sock -Patient advised to return or notify a doctor immediately if symptoms worsen or persist or new concerns arise.  Patient Instructions  -try [place] sock at night  -please follow up with your doctor if persists and may want to see sports doctor     Terressa Koyanagi.

## 2013-03-29 NOTE — Patient Instructions (Signed)
-  try [place] sock at night  -please follow up with your doctor if persists and may want to see sports doctor

## 2013-04-13 ENCOUNTER — Telehealth: Payer: Self-pay | Admitting: Family Medicine

## 2013-04-13 NOTE — Telephone Encounter (Signed)
Spoke to the pt.  Informed her she will need to come in for an appt.  Has been more than 1 year since last seen by Dr. Tawanna Cooler.  Last seen on 04/06/12.  Cough and other ailments will need to be evaluated.  She will call back to make appt when she is able.

## 2013-04-13 NOTE — Telephone Encounter (Signed)
Patient Information:  Caller Name: Deysy  Phone: [phone]  Patient: Rachel Huffman  Gender: Female  DOB: 1961-01-09  Age: 52 Years  PCP: Kelle Darting [facility])  Pregnant: No  Office Follow Up:  Does the office need to follow up with this patient?: Yes  Instructions For The Office: Please call.  RN Note:  Zitlaly has already started her prednisone for her cough. She would like a refill on Hydromet that she has used in the past at night so she may sleep.  Symptoms  Reason For Call & Symptoms: Pt is calling for an rx for her cough. She is unable to sleep at night.  Reviewed Health History In EMR: Yes  Reviewed Medications In EMR: Yes  Reviewed Allergies In EMR: Yes  Reviewed Surgeries / Procedures: Yes  Date of Onset of Symptoms: 04/10/2013 OB / GYN:  LMP: Unknown  Guideline(s) Used:  Cough  Disposition Per Guideline:   See Within 3 Days in Office  Reason For Disposition Reached:   Allergy symptoms are also present (e.g., itchy eyes, clear nasal discharge, postnasal drip)  Advice Given:  Cough Medicines:  Home Remedy - Honey: This old home remedy has been shown to help decrease coughing at night. The adult dosage is 2 teaspoons (10 ml) at bedtime. Honey should not be given to infants under one year of age.  Call Back If:  You become worse.  Patient Will Follow Care Advice:  YES

## 2013-04-13 NOTE — Telephone Encounter (Signed)
Left message on machine for the pt to return my call.  Pt will need to make an appt.  Not seen by Dr. Tawanna Cooler since 2013.

## 2013-04-20 ENCOUNTER — Encounter: Payer: Self-pay | Admitting: Internal Medicine

## 2013-04-20 ENCOUNTER — Ambulatory Visit (INDEPENDENT_AMBULATORY_CARE_PROVIDER_SITE_OTHER): Payer: 59 | Admitting: Internal Medicine

## 2013-04-20 VITALS — BP 120/80 | HR 77 | Temp 98.6°F | Resp 20 | Wt 178.0 lb

## 2013-04-20 DIAGNOSIS — J309 Allergic rhinitis, unspecified: Secondary | ICD-10-CM

## 2013-04-20 DIAGNOSIS — J45909 Unspecified asthma, uncomplicated: Secondary | ICD-10-CM

## 2013-04-20 MED ORDER — ALBUTEROL SULFATE HFA 108 (90 BASE) MCG/ACT IN AERS: 2.0000 | INHALATION_SPRAY | Freq: Four times a day (QID) | RESPIRATORY_TRACT | Status: DC | PRN

## 2013-04-20 MED ORDER — HYDROCODONE-HOMATROPINE 5-1.5 MG/5ML PO SYRP: ORAL_SOLUTION | ORAL | Status: DC

## 2013-04-20 MED ORDER — PREDNISONE 20 MG PO TABS: ORAL_TABLET | ORAL | Status: DC

## 2013-04-20 NOTE — Patient Instructions (Addendum)
Call or return to clinic prn if these symptoms worsen or fail to improve as anticipated.

## 2013-04-20 NOTE — Progress Notes (Signed)
Subjective:    Patient ID: Rachel Huffman, female    DOB: 1960/09/15, 52 y.o.   MRN: [id number]  HPI  52 year old patient who has a history of asthma and allergic rhinitis. For the past several days she feels that she has had a significant allergy flareup after exposure to cleaning products and a dusty environment. She is a considerable cough rhinorrhea and a general sense of unwellness. She does have a history of asthma but no wheezing. She states that she responds quite well to a brief prednisone taper which has not been required in over one year. She also is requesting albuterol refill.  Past Medical History  Diagnosis Date  . Allergy   . Asthma   . Ulcerative (chronic) proctitis     History   Social History  . Marital Status: Married    Spouse Name: N/A    Number of Children: N/A  . Years of Education: N/A   Occupational History  . Not on file.   Social History Main Topics  . Smoking status: Never Smoker   . Smokeless tobacco: Not on file  . Alcohol Use: Yes  . Drug Use: No  . Sexual Activity: Not on file   Other Topics Concern  . Not on file   Social History Narrative  . No narrative on file    History reviewed. No pertinent past surgical history.  No family history on file.  Allergies  Allergen Reactions  . Clarithromycin     REACTION: diarrhea    No current outpatient prescriptions on file prior to visit.   No current facility-administered medications on file prior to visit.    BP 120/80  Pulse 77  Temp(Src) 98.6 F (37 C) (Oral)  Resp 20  Wt 178 lb (80.74 kg)  BMI 28.74 kg/m2  SpO2 97%       Review of Systems  Constitutional: Positive for fatigue.  HENT: Positive for congestion, rhinorrhea and postnasal drip. Negative for hearing loss, sore throat, dental problem, sinus pressure and tinnitus.   Eyes: Negative for pain, discharge and visual disturbance.  Respiratory: Positive for cough. Negative for shortness of breath.   Cardiovascular:  Negative for chest pain, palpitations and leg swelling.  Gastrointestinal: Negative for nausea, vomiting, abdominal pain, diarrhea, constipation, blood in stool and abdominal distention.  Genitourinary: Negative for dysuria, urgency, frequency, hematuria, flank pain, vaginal bleeding, vaginal discharge, difficulty urinating, vaginal pain and pelvic pain.  Musculoskeletal: Negative for joint swelling, arthralgias and gait problem.  Skin: Negative for rash.  Neurological: Negative for dizziness, syncope, speech difficulty, weakness, numbness and headaches.  Hematological: Negative for adenopathy.  Psychiatric/Behavioral: Negative for behavioral problems, dysphoric mood and agitation. The patient is not nervous/anxious.        Objective:   Physical Exam  Constitutional: She is oriented to person, place, and time. She appears well-developed and well-nourished.  HENT:  Head: Normocephalic.  Right Ear: External ear normal.  Left Ear: External ear normal.  Mouth/Throat: Oropharynx is clear and moist.  Eyes: Conjunctivae and EOM are normal. Pupils are equal, round, and reactive to light.  Neck: Normal range of motion. Neck supple. No thyromegaly present.  Cardiovascular: Normal rate, regular rhythm, normal heart sounds and intact distal pulses.   Pulmonary/Chest: Effort normal and breath sounds normal.  Abdominal: Soft. Bowel sounds are normal. She exhibits no mass. There is no tenderness.  Musculoskeletal: Normal range of motion.  Lymphadenopathy:    She has no cervical adenopathy.  Neurological: She is alert and oriented  to person, place, and time.  Skin: Skin is warm and dry. No rash noted.  Psychiatric: She has a normal mood and affect. Her behavior is normal.          Assessment & Plan:  Flare of allergic rhinitis. Will treat with a prednisone dose pack as well as symptomatically with an antitussives. This has been quite effective in the past. She will call if unimproved

## 2013-05-13 ENCOUNTER — Other Ambulatory Visit: Payer: Self-pay | Admitting: Internal Medicine

## 2013-05-26 ENCOUNTER — Other Ambulatory Visit: Payer: Self-pay | Admitting: Family Medicine

## 2013-06-03 ENCOUNTER — Telehealth: Payer: Self-pay | Admitting: Family Medicine

## 2013-06-03 NOTE — Telephone Encounter (Signed)
Per Dr Tawanna Cooler no refills of Toradol patient should go to ortho.  Patient is aware

## 2013-06-03 NOTE — Telephone Encounter (Signed)
Patient had been given a Rx for tramadol for leg pain.  It helps a night when she has been exercising.  Okay to fill?

## 2013-06-03 NOTE — Telephone Encounter (Signed)
Pt would like rachel to return her call concerning leg pain and medication

## 2013-06-09 ENCOUNTER — Telehealth: Payer: Self-pay | Admitting: Family Medicine

## 2013-06-09 NOTE — Telephone Encounter (Signed)
Pt would like to come in the afternoon, sometime soon, for stomach problems. Please advise on scheduling.

## 2013-06-09 NOTE — Telephone Encounter (Signed)
Please call and schedule patient late one afternoon

## 2013-06-09 NOTE — Telephone Encounter (Signed)
Scheduled

## 2013-06-10 ENCOUNTER — Ambulatory Visit (INDEPENDENT_AMBULATORY_CARE_PROVIDER_SITE_OTHER): Payer: 59 | Admitting: Family Medicine

## 2013-06-10 ENCOUNTER — Encounter: Payer: Self-pay | Admitting: Family Medicine

## 2013-06-10 VITALS — BP 120/80 | Temp 98.1°F | Wt 184.0 lb

## 2013-06-10 DIAGNOSIS — N959 Unspecified menopausal and perimenopausal disorder: Secondary | ICD-10-CM | POA: Insufficient documentation

## 2013-06-10 DIAGNOSIS — M25561 Pain in right knee: Secondary | ICD-10-CM | POA: Insufficient documentation

## 2013-06-10 DIAGNOSIS — M25569 Pain in unspecified knee: Secondary | ICD-10-CM

## 2013-06-10 DIAGNOSIS — K589 Irritable bowel syndrome without diarrhea: Secondary | ICD-10-CM | POA: Insufficient documentation

## 2013-06-10 MED ORDER — HYOSCYAMINE SULFATE 0.125 MG SL SUBL: 0.1250 mg | SUBLINGUAL_TABLET | SUBLINGUAL | Status: DC | PRN

## 2013-06-10 NOTE — Patient Instructions (Signed)
Motrin 600 mg 3 times daily with food  Elevation and ice 15-20 minutes 4 times daily  No exercise that would cause any stress to her right knee until the pain is completely gone  I will put in a note for a GI consult  Avoid lactose and all gluten type products,,,,,,,,,,,,,,, Levsin one 4 times a day when necessary for abdominal cramps and diarrhea  Call your GYN and make an appointment for a consult

## 2013-06-10 NOTE — Progress Notes (Signed)
  Subjective:    Patient ID: Rachel Huffman, female    DOB: 13-Sep-1960, 52 y.o.   MRN: [id number]  HPI Rachel Huffman is a 52 year old married female nonsmoker who comes in today for evaluation of for issues  Her major issue is her primary. For many years she says about 20 she's had trouble with alternating constipation diarrhea and bloating. About 10 years ago she takes a medicine that helps she does not recall what was.  She's had no fever chills or weight loss. She states she's had a virus this week because she's had diarrhea for 3 or 4 days. This may be indeed her IBS and not a viral infection. She's had no foreign travel. No weight loss.  She's tried a lactose-free and gluten-free diet but didn't seem to help her GI symptoms. She's 52 and do a colonoscopy  She's having menopausal symptoms however she's been to the GYN referred back to them  She's had pain in her right knee for about 6 days. She was working out with a Systems analyst and after exercising and doing a lot of squatting she noticed soreness in the lateral portion of her right knee. It's been no swelling and no locking. No previous history of trauma.   Review of Systems    review of systems otherwise negative Objective:   Physical Exam  Well-developed well-nourished female no acute distress examination the abdomen the abdomen was soft bowel sounds are normal liver spleen kidneys not enlarged I can appreciate no masses.  Examination of both knees in the sitting position they both appear normal. The right knee is not red or hot nor swollen. There is minimal tenderness right lateral joint line. Ligaments intact      Assessment & Plan:  Probable IBS plan Levsin GI consult  Menopausal symptoms refer to GYN  Pain right knee Motrin 600 mg 3 times daily with food elevation and ice 15 minutes 4 times daily  No weight bearing exercise until the pain resolves

## 2013-07-06 ENCOUNTER — Telehealth: Payer: Self-pay | Admitting: Family Medicine

## 2013-07-06 MED ORDER — BENZONATATE 100 MG PO CAPS: ORAL_CAPSULE | ORAL | Status: DC

## 2013-07-06 NOTE — Telephone Encounter (Signed)
Tessalon Perles 100 mg,,,,,, dispense 40,,,,,,,,, 1 tablet 2-3 times daily when necessary for cough  If symptoms persist then I would recommend a pulmonary consult for further evaluation

## 2013-07-06 NOTE — Telephone Encounter (Signed)
Pt would like another rx for hydromet

## 2013-07-08 ENCOUNTER — Other Ambulatory Visit: Payer: Self-pay

## 2013-08-04 ENCOUNTER — Encounter: Payer: Self-pay | Admitting: Family Medicine

## 2013-08-30 ENCOUNTER — Other Ambulatory Visit: Payer: Self-pay

## 2013-08-30 DIAGNOSIS — Z1231 Encounter for screening mammogram for malignant neoplasm of breast: Secondary | ICD-10-CM

## 2013-09-24 ENCOUNTER — Ambulatory Visit
Admission: RE | Admit: 2013-09-24 | Discharge: 2013-09-24 | Disposition: A | Discharge status: Home / Self Care | Payer: 59 | Source: Ambulatory Visit

## 2013-09-24 DIAGNOSIS — Z1231 Encounter for screening mammogram for malignant neoplasm of breast: Secondary | ICD-10-CM

## 2013-12-17 ENCOUNTER — Telehealth: Payer: Self-pay | Admitting: Family Medicine

## 2013-12-17 NOTE — Telephone Encounter (Signed)
Pt would like to switch to dr Fabian Sharppanosh . Pt prefers a female md now

## 2013-12-22 NOTE — Telephone Encounter (Signed)
Okay per Dr Todd 

## 2013-12-22 NOTE — Telephone Encounter (Signed)
Ok to add to transfer patinet

## 2013-12-23 NOTE — Telephone Encounter (Signed)
Please call patient and schedule an appointment

## 2013-12-24 NOTE — Telephone Encounter (Signed)
ok 

## 2013-12-24 NOTE — Telephone Encounter (Signed)
Dr Fabian Sharppanosh can I sch this patient?

## 2013-12-27 NOTE — Telephone Encounter (Signed)
appt scheduled for pt on 03/11/14

## 2013-12-27 NOTE — Telephone Encounter (Signed)
lmom for pt to sch appt °

## 2014-01-25 ENCOUNTER — Encounter: Payer: Self-pay | Admitting: Family Medicine

## 2014-01-25 ENCOUNTER — Ambulatory Visit: Payer: 59 | Admitting: Internal Medicine

## 2014-01-25 ENCOUNTER — Encounter: Payer: 59 | Admitting: Internal Medicine

## 2014-01-25 ENCOUNTER — Ambulatory Visit (INDEPENDENT_AMBULATORY_CARE_PROVIDER_SITE_OTHER): Payer: 59 | Admitting: Family Medicine

## 2014-01-25 ENCOUNTER — Encounter: Payer: Self-pay | Admitting: Internal Medicine

## 2014-01-25 VITALS — BP 120/80 | Temp 98.8°F | Wt 186.0 lb

## 2014-01-25 DIAGNOSIS — J309 Allergic rhinitis, unspecified: Secondary | ICD-10-CM

## 2014-01-25 DIAGNOSIS — J45909 Unspecified asthma, uncomplicated: Secondary | ICD-10-CM

## 2014-01-25 MED ORDER — BENZONATATE 100 MG PO CAPS: ORAL_CAPSULE | ORAL | Status: DC

## 2014-01-25 MED ORDER — MONTELUKAST SODIUM 10 MG PO TABS: 10.0000 mg | ORAL_TABLET | Freq: Every day | ORAL | Status: DC

## 2014-01-25 MED ORDER — PREDNISONE 20 MG PO TABS: ORAL_TABLET | ORAL | Status: DC

## 2014-01-25 NOTE — Progress Notes (Signed)
error 

## 2014-01-25 NOTE — Progress Notes (Signed)
   Subjective:    Patient ID: Rachel Huffman, female    DOB: 23-Aug-1961, 53 y.o.   MRN: [id number]  HPI Rachel Huffman is a 53 year old married female nonsmoker who comes in today for evaluation of allergic rhinitis for 3 weeks and asthma for 5 days  She states urology been bothered for about 3 weeks the usual sneezing runny nose etc. and then about 5 days ago she began coughing and wheezing. No fever no earache or sore throat. She's using up-year-old when necessary    Review of Systems    review of systems otherwise negative Objective:   Physical Exam Well-developed well-nourished female no acute distress vital signs stable she's afebrile HEENT negative neck was supple no adenopathy lungs are clear no wheezing       Assessment & Plan:  Allergic rhinitis with reactive airway disease,,,,,,,,,

## 2014-01-25 NOTE — Progress Notes (Signed)
Pre visit review using our clinic review tool, if applicable. No additional management support is needed unless otherwise documented below in the visit note. 

## 2014-01-25 NOTE — Patient Instructions (Signed)
Singulair 10 mg.......Marland Kitchen 1 nightly at bedtime  Tessalon.........Marland Kitchen 1 every 4-6 hours when necessary for cough  Prednisone 20 mg........... 2 tabs x3 days or until you get a lot better than taper as outlined

## 2014-03-07 ENCOUNTER — Ambulatory Visit (INDEPENDENT_AMBULATORY_CARE_PROVIDER_SITE_OTHER): Payer: 59 | Admitting: Physician Assistant

## 2014-03-07 ENCOUNTER — Ambulatory Visit (INDEPENDENT_AMBULATORY_CARE_PROVIDER_SITE_OTHER)
Admission: RE | Admit: 2014-03-07 | Discharge: 2014-03-07 | Disposition: A | Discharge status: Home / Self Care | Payer: 59 | Source: Ambulatory Visit | Attending: Physician Assistant | Admitting: Physician Assistant

## 2014-03-07 ENCOUNTER — Encounter: Payer: Self-pay | Admitting: Physician Assistant

## 2014-03-07 VITALS — BP 110/80 | HR 78 | Temp 98.2°F | Resp 18 | Wt 184.0 lb

## 2014-03-07 DIAGNOSIS — J209 Acute bronchitis, unspecified: Secondary | ICD-10-CM

## 2014-03-07 DIAGNOSIS — J0110 Acute frontal sinusitis, unspecified: Secondary | ICD-10-CM

## 2014-03-07 MED ORDER — DOXYCYCLINE HYCLATE 100 MG PO TABS: 100.0000 mg | ORAL_TABLET | Freq: Two times a day (BID) | ORAL | Status: DC

## 2014-03-07 MED ORDER — HYDROCODONE-HOMATROPINE 5-1.5 MG/5ML PO SYRP: 5.0000 mL | ORAL_SOLUTION | Freq: Three times a day (TID) | ORAL | Status: DC | PRN

## 2014-03-07 NOTE — Patient Instructions (Signed)
You have a chest x-ray done today at the [facility] office. We will call you with the results of this when it is available.  Hycodan every 8 hours as needed for cough symptoms, you cannot drive while taking this medication as it will inhibit your ability to operate a motor vehicle.  While you likely do not need an antibiotic at this time, due to the length of time of your illness, you will be able to pick up a prescription of doxycycline at your pharmacy in 3 days if you are not improving or worsening.  If you do fill the antibiotic, you need to take the entire course. Doxycycline twice daily for 10 days, take with food to prevent nausea.  Plain Over the Counter Mucinex (NOT Mucinex D) for thick secretions  Force NON dairy fluids, drinking plenty of water is best.    Over the Counter Flonase OR Nasacort AQ 1 spray in each nostril twice a day as needed. Use the "crossover" technique into opposite nostril spraying toward opposite ear @ 45 degree angle, not straight up into nostril.   Plain Over the Counter Allegra (NOT D )  160 daily , OR Loratidine 10 mg , OR Zyrtec 10 mg @ bedtime  as needed for itchy eyes & sneezing.  Saline Irrigation and Saline Sprays can also help reduce symptoms.  If emergency symptoms discussed during visit developed, seek medical attention immediately.  Followup as needed, or for worsening or persistent symptoms despite treatment.    Acute Bronchitis Bronchitis is when the airways that extend from the windpipe into the lungs get red, puffy, and painful (inflamed). Bronchitis often causes thick spit (mucus) to develop. This leads to a cough. A cough is the most common symptom of bronchitis. In acute bronchitis, the condition usually begins suddenly and goes away over time (usually in 2 weeks). Smoking, allergies, and asthma can make bronchitis worse. Repeated episodes of bronchitis may cause more lung problems. HOME CARE  Rest.  Drink enough fluids to keep your pee  (urine) clear or pale yellow (unless you need to limit fluids as told by your doctor).  Only take over-the-counter or prescription medicines as told by your doctor.  Avoid smoking and secondhand smoke. These can make bronchitis worse. If you are a smoker, think about using nicotine gum or skin patches. Quitting smoking will help your lungs heal faster.  Reduce the chance of getting bronchitis again by:  Washing your hands often.  Avoiding people with cold symptoms.  Trying not to touch your hands to your mouth, nose, or eyes.  Follow up with your doctor as told. GET HELP IF: Your symptoms do not improve after 1 week of treatment. Symptoms include:  Cough.  Fever.  Coughing up thick spit.  Body aches.  Chest congestion.  Chills.  Shortness of breath.  Sore throat. GET HELP RIGHT AWAY IF:   You have an increased fever.  You have chills.  You have severe shortness of breath.  You have bloody thick spit (sputum).  You throw up (vomit) often.  You lose too much body fluid (dehydration).  You have a severe headache.  You faint. MAKE SURE YOU:   Understand these instructions.  Will watch your condition.  Will get help right away if you are not doing well or get worse. Document Released: 02/05/2008 Document Revised: 04/21/2013 Document Reviewed: 02/09/2013 [facility] Patient Information 2015 [place], [place]. This information is not intended to replace advice given to you by your health care provider. Make  sure you discuss any questions you have with your health care provider. Sinusitis Sinusitis is redness, soreness, and puffiness (inflammation) of the air pockets in the bones of your face (sinuses). The redness, soreness, and puffiness can cause air and mucus to get trapped in your sinuses. This can allow germs to grow and cause an infection.  HOME CARE   Drink enough fluids to keep your pee (urine) clear or pale yellow.  Use a humidifier in your home.  Run a  hot shower to create steam in the bathroom. Sit in the bathroom with the door closed. Breathe in the steam 3-4 times a day.  Put a warm, moist washcloth on your face 3-4 times a day, or as told by your doctor.  Use salt water sprays (saline sprays) to wet the thick fluid in your nose. This can help the sinuses drain.  Only take medicine as told by your doctor. GET HELP RIGHT AWAY IF:   Your pain gets worse.  You have very bad headaches.  You are sick to your stomach (nauseous).  You throw up (vomit).  You are very sleepy (drowsy) all the time.  Your face is puffy (swollen).  Your vision changes.  You have a stiff neck.  You have trouble breathing. MAKE SURE YOU:   Understand these instructions.  Will watch your condition.  Will get help right away if you are not doing well or get worse. Document Released: 02/05/2008 Document Revised: 05/13/2012 Document Reviewed: 03/24/2012 [facility] Patient Information 2015 [place], [place]. This information is not intended to replace advice given to you by your health care provider. Make sure you discuss any questions you have with your health care provider.

## 2014-03-07 NOTE — Progress Notes (Signed)
Subjective:    Patient ID: Rachel Huffman, female    DOB: [birth year], 53 y.o.   MRN: [id number]  Cough This is a new problem. The current episode started 1 to 4 weeks ago. The problem has been gradually worsening. The problem occurs constantly (cant sleep). The cough is productive of purulent sputum. Associated symptoms include ear congestion, a fever (101, took ibuprofen and it resolved, none since.), headaches, postnasal drip, rhinorrhea, a sore throat and wheezing. Pertinent negatives include no chest pain, chills, ear pain, heartburn, hemoptysis, myalgias, nasal congestion, rash, shortness of breath, sweats or weight loss. The symptoms are aggravated by lying down. Treatments tried: ibuprofen, delsum. The treatment provided mild relief. Her past medical history is significant for environmental allergies. There is no history of asthma or COPD.      Review of Systems  Constitutional: Positive for fever (101, took ibuprofen and it resolved, none since.). Negative for chills and weight loss.  HENT: Positive for postnasal drip, rhinorrhea, sinus pressure and sore throat. Negative for ear pain.   Respiratory: Positive for cough and wheezing. Negative for hemoptysis and shortness of breath.   Cardiovascular: Negative for chest pain.  Gastrointestinal: Positive for nausea. Negative for heartburn, vomiting and diarrhea.  Musculoskeletal: Negative for myalgias.  Skin: Negative for rash.  Allergic/Immunologic: Positive for environmental allergies.  Neurological: Positive for headaches.  All other systems reviewed and are negative.     Past Medical History  Diagnosis Date  . Allergy   . Asthma   . Ulcerative (chronic) proctitis     History   Social History  . Marital Status: Married    Spouse Name: N/A    Number of Children: N/A  . Years of Education: N/A   Occupational History  . Not on file.   Social History Main Topics  . Smoking status: Never Smoker   . Smokeless tobacco:  Not on file  . Alcohol Use: Yes  . Drug Use: No  . Sexual Activity: Not on file   Other Topics Concern  . Not on file   Social History Narrative  . No narrative on file    History reviewed. No pertinent past surgical history.  No family history on file.  Allergies  Allergen Reactions  . Clarithromycin     REACTION: diarrhea    Current Outpatient Prescriptions on File Prior to Visit  Medication Sig Dispense Refill  . albuterol (PROAIR HFA) 108 (90 BASE) MCG/ACT inhaler Inhale 2 puffs into the lungs every 6 (six) hours as needed for wheezing.  18 g  6  . hyoscyamine (LEVSIN/SL) 0.125 MG SL tablet Place 1 tablet (0.125 mg total) under the tongue every 4 (four) hours as needed for cramping.  30 tablet  3   No current facility-administered medications on file prior to visit.    EXAM: BP 110/80  Pulse 78  Temp(Src) 98.2 F (36.8 C) (Oral)  Resp 18  Wt 184 lb (83.462 kg)  SpO2 99%     Objective:   Physical Exam  Nursing note and vitals reviewed. Constitutional: She is oriented to person, place, and time. She appears well-developed and well-nourished. No distress.  HENT:  Head: Normocephalic and atraumatic.  Right Ear: External ear normal.  Left Ear: External ear normal.  Nose: Nose normal.  Mouth/Throat: No oropharyngeal exudate.  Oropharynx is slightly erythematous, no exudate. Bilateral TMs normal. Bilateral Frontal sinuses TTP. Bilateral maxillary sinuses non-TTP.   Eyes: Conjunctivae and EOM are normal. Pupils are equal, round, and reactive  to light.  Neck: Normal range of motion. Neck supple. No JVD present.  Cardiovascular: Normal rate, regular rhythm and intact distal pulses.   Pulmonary/Chest: Effort normal and breath sounds normal. No stridor. No respiratory distress. She has no wheezes. She has no rales. She exhibits no tenderness.  Mild rhonchi bilat lung bases.  Musculoskeletal: Normal range of motion. She exhibits no edema.  Lymphadenopathy:    She  has no cervical adenopathy.  Neurological: She is alert and oriented to person, place, and time.  Skin: Skin is warm and dry. No rash noted. She is not diaphoretic. No erythema. No pallor.  Psychiatric: She has a normal mood and affect. Her behavior is normal. Judgment and thought content normal.    Lab Results  Component Value Date   WBC 12.7* 10/19/2012   HGB 15.8* 10/19/2012   HCT 44.0 10/19/2012   PLT 313 10/19/2012   GLUCOSE 155* 10/19/2012   ALT 35 10/19/2012   Huffman 26 10/19/2012   NA 135 10/19/2012   K 4.2 10/19/2012   CL 100 10/19/2012   CREATININE 0.89 10/19/2012   BUN 19 10/19/2012   CO2 20 10/19/2012         Assessment & Plan:  Lawson FiscalLori was seen today for cough.  Diagnoses and associated orders for this visit:  Acute bronchitis, unspecified organism Comments: several weeks of cough, will get CXR. Hycodan to help relieve cough symptoms. - HYDROcodone-homatropine (HYCODAN) 5-1.5 MG/5ML syrup; Take 5 mLs by mouth every 8 (eight) hours as needed for cough (No driving while taking this medication as it will inhibit your ability to operate a motor vehicle.). - DG Chest 2 View; Future  Acute frontal sinusitis, recurrence not specified Comments: Frontal sinus tenderness, viral vs bacterial. Will postdate abx Rx for 3 days if symptoms worsening despite tx. - doxycycline (VIBRA-TABS) 100 MG tablet; Take 1 tablet (100 mg total) by mouth 2 (two) times daily.    Patient will have chest x-ray performed today due to the length of time of cough.  Postdate doxycycline for 3 days from today just incase pt is worsening or still not improving.  Return precautions provided, and patient handout on acute bronchitis and sinusitis.  Plan to follow up as needed, or for worsening or persistent symptoms despite treatment.  Patient Instructions  You have a chest x-ray done today at the [facility] [place]. We will call you with the results of this when it is available.  Hycodan every 8 hours as needed for  cough symptoms, you cannot drive while taking this medication as it will inhibit your ability to operate a motor vehicle.  While you likely do not need an antibiotic at this time, due to the length of time of your illness, you will be able to pick up a prescription of doxycycline at your pharmacy in 3 days if you are not improving or worsening.  If you do fill the antibiotic, you need to take the entire course. Doxycycline twice daily for 10 days, take with food to prevent nausea.  Plain Over the Counter Mucinex (NOT Mucinex D) for thick secretions  Force NON dairy fluids, drinking plenty of water is best.    Over the Counter Flonase OR Nasacort AQ 1 spray in each nostril twice a day as needed. Use the "crossover" technique into opposite nostril spraying toward opposite ear @ 45 degree angle, not straight up into nostril.   Plain Over the Counter Allegra (NOT D )  160 daily , OR Loratidine 10 mg ,  OR Zyrtec 10 mg @ bedtime  as needed for itchy eyes & sneezing.  Saline Irrigation and Saline Sprays can also help reduce symptoms.  If emergency symptoms discussed during visit developed, seek medical attention immediately.  Followup as needed, or for worsening or persistent symptoms despite treatment.

## 2014-03-07 NOTE — Progress Notes (Signed)
Pre visit review using our clinic review tool, if applicable. No additional management support is needed unless otherwise documented below in the visit note. 

## 2014-03-11 ENCOUNTER — Ambulatory Visit (INDEPENDENT_AMBULATORY_CARE_PROVIDER_SITE_OTHER): Payer: 59 | Admitting: Internal Medicine

## 2014-03-11 ENCOUNTER — Encounter: Payer: Self-pay | Admitting: Internal Medicine

## 2014-03-11 VITALS — BP 122/74 | HR 70 | Temp 97.7°F | Ht 66.25 in | Wt 185.0 lb

## 2014-03-11 DIAGNOSIS — I10 Essential (primary) hypertension: Secondary | ICD-10-CM

## 2014-03-11 DIAGNOSIS — T887XXA Unspecified adverse effect of drug or medicament, initial encounter: Secondary | ICD-10-CM | POA: Insufficient documentation

## 2014-03-11 DIAGNOSIS — R053 Chronic cough: Secondary | ICD-10-CM

## 2014-03-11 DIAGNOSIS — J309 Allergic rhinitis, unspecified: Secondary | ICD-10-CM

## 2014-03-11 DIAGNOSIS — R059 Cough, unspecified: Secondary | ICD-10-CM

## 2014-03-11 DIAGNOSIS — R05 Cough: Secondary | ICD-10-CM | POA: Insufficient documentation

## 2014-03-11 DIAGNOSIS — T50905A Adverse effect of unspecified drugs, medicaments and biological substances, initial encounter: Secondary | ICD-10-CM

## 2014-03-11 DIAGNOSIS — Z7189 Other specified counseling: Secondary | ICD-10-CM

## 2014-03-11 DIAGNOSIS — J209 Acute bronchitis, unspecified: Secondary | ICD-10-CM

## 2014-03-11 DIAGNOSIS — Z7689 Persons encountering health services in other specified circumstances: Secondary | ICD-10-CM

## 2014-03-11 MED ORDER — HYDROCODONE-HOMATROPINE 5-1.5 MG/5ML PO SYRP: 5.0000 mL | ORAL_SOLUTION | Freq: Three times a day (TID) | ORAL | Status: DC | PRN

## 2014-03-11 NOTE — Progress Notes (Signed)
Pre visit review using our clinic review tool, if applicable. No additional management support is needed unless otherwise documented below in the visit note.  Chief Complaint  Patient presents with  . Establish Care    Seen Rachel BeersMatthew Huffman on [birth year]50 for Bronchitis.  Unable to take the doxycycline due to nausea.     HPI: Patient comes in today for transfer of care  From Dr Rachel Coolerodd  [name] is a 53 year old nonsmoking married white female mother [place] 2 [place] at [place].. she is generally well without serious chronic conditions. Sees Dr. Claiborne Billingsallahan [facility] for Paps no history of abnormals Has not had a colonoscopy yet but is planning on it. Cough    for 2-1/2 weeks saw Ebony CargoMatt Huffman given antibiotic if not better and cough medicine doxycycline cause nausea after 4 doses began some leftover amoxicillin she had from dental procedures 500 mg per day no help so far has sinus pressure some drainage and then persistent cough no series shortness of breath. Small amount of colored phlegm. Of interest he began after a move where she was exposed to inhaling a lot of dust but then started to act like a cold or chest cold. She has no history of asthma but does have allergies and has been put on prednisone before surgery some respiratory symptoms. Hot flushes   No hospitalized  No major injuries  or Concussion laporoscopy  In past.  G2 P2   Health Maintenance  Topic Date Due  . Colonoscopy  12/04/2010  . Influenza Vaccine  04/02/2014  . Pap Smear  09/02/2014  . Tetanus/tdap  09/02/2014  . Mammogram  09/25/2015   Health Maintenance Review LIFESTYLE:  Exercise:   3 x per week  Trying to lose weight  Tobacco/ETS: no  Alcohol: per day  Rare  Sugar beverages:no Sleep: 7 hours  Drug use: no mammo dense breatst.  Colonoscopy: not done  Yet.  Allergies no asthma .  ROS:  GEN/ HEENT: No fever, significant weight changes sweats headaches vision problems  hearing changes,has Hot flushes  CV/ PULM; No chest pain shortness of breath, syncope,edema  change in exercise tolerance. GI /GU: No adominal pain, vomiting, change in bowel habits. No blood in the stool. No significant GU symptoms. SKIN/HEME: ,no acute skin rashes suspicious lesions or bleeding. No lymphadenopathy, nodules, masses.  NEURO/ PSYCH:  No neurologic signs such as weakness numbness. No depression anxiety. IMM/ Allergy: No unusual infections.  Allergy .  As above  REST of 12 system review negative except as per HPI   Past Medical History  Diagnosis Date  . Allergy   . Asthma   . Ulcerative (chronic) proctitis     Family History  Problem Relation Age of Onset  . Hyperlipidemia Mother     HT  . Heart disease Father     stents died of cva     History   Social History  . Marital Status: Married    Spouse Name: N/A    Number of Children: N/A  . Years of Education: N/A   Social History Main Topics  . Smoking status: Never Smoker   . Smokeless tobacco: Never Used  . Alcohol Use: Yes  . Drug Use: No  . Sexual Activity: Not on file   Other Topics Concern  . Not on file   Social History Narrative   orig fr [place]   hh of 2    1 pet dog.  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  Soon       g2p2    Outpatient Encounter Prescriptions as of 03/11/2014  Medication Sig  . albuterol (PROAIR HFA) 108 (90 BASE) MCG/ACT inhaler Inhale 2 puffs into the lungs every 6 (six) hours as needed for wheezing.  Marland Kitchen HYDROcodone-homatropine (HYCODAN) 5-1.5 MG/5ML syrup Take 5 mLs by mouth every 8 (eight) hours as needed for cough (No driving while taking this medication as it will inhibit your ability to operate a motor vehicle.).  Marland Kitchen hyoscyamine (LEVSIN/SL) 0.125 MG SL tablet Place 1 tablet (0.125 mg total) under the tongue every 4 (four) hours as needed for cramping.  . [DISCONTINUED] HYDROcodone-homatropine (HYCODAN)  5-1.5 MG/5ML syrup Take 5 mLs by mouth every 8 (eight) hours as needed for cough (No driving while taking this medication as it will inhibit your ability to operate a motor vehicle.).  . [DISCONTINUED] doxycycline (VIBRA-TABS) 100 MG tablet Take 1 tablet (100 mg total) by mouth 2 (two) times daily.    EXAM:  BP 122/74  Pulse 70  Temp(Src) 97.7 F (36.5 C) (Oral)  Ht 5' 6.25" (1.683 m)  Wt 185 lb (83.915 kg)  BMI 29.63 kg/m2  SpO2 98%  Body mass index is 29.63 kg/(m^2).  Physical Exam: Vital signs reviewed ZOX:WRUE is a well-developed well-nourished alert cooperative    who appearsr stated age in no acute distress.  Cough dry mostly in nad  HEENT: normocephalic atraumatic , Eyes: PERRL EOM's full, conjunctiva clear, Nares: paten,t no deformity discharge or tenderness., Ears: no deformity EAC's clear TMs with normal landmarks. Mouth: clear OP, no lesions, edema. Face tndern over sinus areas   Moist mucous membranes. Dentition in adequate repair. NECK: supple without masses, thyromegaly or bruits. CHEST/PULM:   bs = few musical sounds left base no rales or rhonchiCV: PMI is nondisplaced, S1 S2 no gallops, murmurs, rubs. . Extremtities:  No clubbing cyanosis or edema, no acute joint swelling or redness no focal atrophy NEURO:  Oriented x3, cranial nerves 3-12 appear to be intact, no obvious focal weakness,gait within normal limits no abnormal reflexes or asymmetrical SKIN: No acute rashes normal turgor, color, no bruising or petechiae. PSYCH: Oriented, good eye contact, no obvious depression anxiety, cognition and judgment appear normal. LN: no cervical  adenopathy  ASSESSMENT AND PLAN:  Discussed the following assessment and plan:  Cough, persistent - poss post infectious sinus sx ? reactive airway w exposure hx   Acute bronchitis, unspecified organism - several weeks of cough, will get CXR. Hycodan to help relieve cough symptoms. - Plan: HYDROcodone-homatropine (HYCODAN) 5-1.5 MG/5ML  syrup  Allergic rhinitis, cause unspecified  Medication side effect, initial encounter - nausea with doxy    Encounter to establish care with new doctor Uncertain if antibiotic witll help can change to amox and short course pred   Get tdap when convenient and cpx with labs in the fall  HCM reveiwed today  Refill cough med x 1  Patient Care Team: Madelin Headings, MD as PCP - General (Internal Medicine) Philip Aspen, DO as Consulting Physician (Obstetrics and Gynecology) Patient Instructions  Get a colonoscopy .   Can try amox 500 3 x per day for 7 days  For sinus infection. Consider.  prednisone   20 mg twice a day for 5 days.  Get on schedule for fall cpx and labs . Get the tdap  beofre grand child  is born.     Neta Mends. Sadat Sliwa M.D.

## 2014-03-11 NOTE — Patient Instructions (Addendum)
Get a colonoscopy .   Can try amox 500 3 x per day for 7 days  For sinus infection. Consider.  prednisone   20 mg twice a day for 5 days.  Get on schedule for fall cpx and labs . Get the tdap  beofre grand child is born.

## 2014-03-23 ENCOUNTER — Ambulatory Visit (INDEPENDENT_AMBULATORY_CARE_PROVIDER_SITE_OTHER): Payer: 59 | Admitting: Family Medicine

## 2014-03-23 DIAGNOSIS — Z23 Encounter for immunization: Secondary | ICD-10-CM

## 2014-05-06 ENCOUNTER — Other Ambulatory Visit: Payer: Self-pay | Admitting: Family Medicine

## 2014-05-06 NOTE — Telephone Encounter (Signed)
Will send to Dr. Fabian Sharp to see if she is taking over this medication.

## 2014-05-06 NOTE — Telephone Encounter (Signed)
Ok to refill x 3 

## 2014-05-06 NOTE — Telephone Encounter (Signed)
Sent to the pharmacy by e-scribe. 

## 2014-05-11 ENCOUNTER — Encounter: Payer: Self-pay | Admitting: Internal Medicine

## 2014-05-11 ENCOUNTER — Ambulatory Visit (INDEPENDENT_AMBULATORY_CARE_PROVIDER_SITE_OTHER): Payer: 59 | Admitting: Internal Medicine

## 2014-05-11 VITALS — BP 120/84 | Temp 98.5°F | Ht 66.25 in | Wt 187.0 lb

## 2014-05-11 DIAGNOSIS — G2581 Restless legs syndrome: Secondary | ICD-10-CM

## 2014-05-11 DIAGNOSIS — Z79899 Other long term (current) drug therapy: Secondary | ICD-10-CM

## 2014-05-11 MED ORDER — TRAMADOL HCL 50 MG PO TABS: 50.0000 mg | ORAL_TABLET | Freq: Every evening | ORAL | Status: DC | PRN

## 2014-05-11 NOTE — Progress Notes (Signed)
Pre visit review using our clinic review tool, if applicable. No additional management support is needed unless otherwise documented below in the visit note.  Chief Complaint  Patient presents with  . Knee Pain and Restlessness at night    Has been seeing Dr. Delton See and been given tramadol for her knee pain and restlessness at night.  Dr. Zachery Dauer would like PCP to now take over this medication.    HPI: Rachel Huffman 53 y.o. comes in for concern about nocturnal resteessness and rls  Has had a problem for a number of years but was seeing dr Zachery Dauer sports med for knee issues also  rx with Injections   For knee    Now better . Has bone spur . He had been giving  her med in the short run for RLS and was very helpful  Husband noted such . No se next day .  No unusual dreams or evening  Problems   Gave 30 days rx   And worked for him  And since not seeing   Him any more  So wanted to transition to pcp.? Triggers with trainer.   Taking  Tramadol. hs and helps a lot.   Has been on its about 9 months  From him  .  Onset for years and  Husband noted and med . Helped.   Husband has to kick her out of bed at times as she "runs in her sleep". recently when not taking he told her about it .  Not run in family . ocass hot flushes .  ROS: See pertinent positives and negatives per HPI. fam hx : neg Sleep: no osa insomnia.   No caffeine at night  Sometimes tea .  Rare etoh.  Past Medical History  Diagnosis Date  . Allergy   . Asthma   . Ulcerative (chronic) proctitis     Family History  Problem Relation Age of Onset  . Hyperlipidemia Mother     HT  . Heart disease Father     stents died of cva     History   Social History  . Marital Status: Married    Spouse Name: N/A    Number of Children: N/A  . Years of Education: N/A   Social History Main Topics  . Smoking status: Never Smoker   . Smokeless tobacco: Never Used  . Alcohol Use: Yes  . Drug Use: No  . Sexual Activity: None    Other Topics Concern  . None   Social History Narrative   orig fr [place]   hh of 2    1 pet dog.   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  Soon       g2p2    Outpatient Encounter Prescriptions as of 05/11/2014  Medication Sig  . albuterol (PROAIR HFA) 108 (90 BASE) MCG/ACT inhaler Inhale 2 puffs into the lungs every 6 (six) hours as needed for wheezing.  . hyoscyamine (LEVSIN SL) 0.125 MG SL tablet PLACE 1 TABLET UNDER THE TONGUE EVERY 4 HOURS AS NEEDED FOR CRAMPING  . traMADol (ULTRAM) 50 MG tablet Take 1 tablet (50 mg total) by mouth at bedtime as needed (for restless legs).  . [DISCONTINUED] traMADol (ULTRAM) 50 MG tablet   . [DISCONTINUED] HYDROcodone-homatropine (HYCODAN) 5-1.5 MG/5ML syrup Take 5 mLs by mouth every 8 (eight) hours as needed for cough (No driving while taking this medication  as it will inhibit your ability to operate a motor vehicle.).    EXAM:  BP 120/84  Temp(Src) 98.5 F (36.9 C) (Oral)  Ht 5' 6.25" (1.683 m)  Wt 187 lb (84.823 kg)  BMI 29.95 kg/m2  Body mass index is 29.95 kg/(m^2).  GENERAL: vitals reviewed and listed above, alert, oriented, appears well hydrated and in no acute distress HEENT: atraumatic, conjunctiva  clear, no obvious abnormalities on inspection of external nose and ears OP : no lesion edema or exudate  NECK: no obvious masses on inspection palpation   CV: HRRR, no clubbing cyanosis or  peripheral edema nl cap refill  MS: moves all extremities without noticeable focal  Abnormality no lesions or edema  PSYCH: pleasant and cooperative, no obvious depression or anxiety Lab Results  Component Value Date   WBC 12.7* 10/19/2012   HGB 15.8* 10/19/2012   HCT 44.0 10/19/2012   PLT 313 10/19/2012   GLUCOSE 155* 10/19/2012   ALT 35 10/19/2012   AST 26 10/19/2012   NA 135 10/19/2012   K 4.2 10/19/2012   CL 100 10/19/2012   CREATININE 0.89 10/19/2012   BUN 19 10/19/2012   CO2 20  10/19/2012    ASSESSMENT AND PLAN:  Discussed the following assessment and plan:  Controlled restless leg syndrome - on tramadol hs per dr Zachery Dauer  seems to work well without sig se benefit more than risk at this time. contract check iron studies at [facility]  Medication management - benefot more than risk at this time will follow get records dr Zachery Dauer for transition plan iron studies in future Plan cbc iron bmp tsh  With cpx labs  -Patient advised to return or notify health care team  if symptoms worsen ,persist or new concerns arise.  Patient Instructions  Please get [place] last record form last 2-3  visits from Dr.  Zachery Dauer .  At this time  Plan fasting labs  To include iron panel with the cpx labs.  Sign controlled substance contract . As we discussed .      Neta Mends. Nayda Riesen M.D.

## 2014-05-11 NOTE — Patient Instructions (Signed)
Please get [place] last record form last 2-3  visits from Dr.  Zachery Dauer .  At this time  Plan fasting labs  To include iron panel with the cpx labs.  Sign controlled substance contract . As we discussed .

## 2014-05-14 DIAGNOSIS — Z79899 Other long term (current) drug therapy: Secondary | ICD-10-CM | POA: Insufficient documentation

## 2014-05-14 DIAGNOSIS — G2581 Restless legs syndrome: Secondary | ICD-10-CM | POA: Insufficient documentation

## 2014-05-19 ENCOUNTER — Telehealth: Payer: Self-pay | Admitting: Internal Medicine

## 2014-05-19 NOTE — Telephone Encounter (Signed)
LMOM about time change for pt.

## 2014-05-19 NOTE — Telephone Encounter (Signed)
Pt has a CPX on 06/10/14 at 0815 but she needs a later time that day.  She would like to know if she can be worked in again anytime after 10??

## 2014-05-19 NOTE — Telephone Encounter (Signed)
1115 and 1130 slot  ok

## 2014-05-25 ENCOUNTER — Other Ambulatory Visit: Payer: Self-pay | Admitting: Internal Medicine

## 2014-06-03 ENCOUNTER — Other Ambulatory Visit (INDEPENDENT_AMBULATORY_CARE_PROVIDER_SITE_OTHER): Payer: 59

## 2014-06-03 ENCOUNTER — Encounter: Payer: Self-pay | Admitting: *Deleted

## 2014-06-03 DIAGNOSIS — Z0000 Encounter for general adult medical examination without abnormal findings: Secondary | ICD-10-CM

## 2014-06-03 LAB — CBC WITH DIFFERENTIAL/PLATELET
BASOS PCT: 0.8 % (ref 0.0–3.0)
Basophils Absolute: 0 10*3/uL (ref 0.0–0.1)
EOS ABS: 0.1 10*3/uL (ref 0.0–0.7)
EOS PCT: 2.3 % (ref 0.0–5.0)
HCT: 41.8 % (ref 36.0–46.0)
Hemoglobin: 14.1 g/dL (ref 12.0–15.0)
LYMPHS PCT: 27 % (ref 12.0–46.0)
Lymphs Abs: 1.5 10*3/uL (ref 0.7–4.0)
MCHC: 33.8 g/dL (ref 30.0–36.0)
MCV: 89.3 fl (ref 78.0–100.0)
Monocytes Absolute: 0.4 10*3/uL (ref 0.1–1.0)
Monocytes Relative: 7.6 % (ref 3.0–12.0)
NEUTROS ABS: 3.5 10*3/uL (ref 1.4–7.7)
NEUTROS PCT: 62.3 % (ref 43.0–77.0)
PLATELETS: 315 10*3/uL (ref 150.0–400.0)
RBC: 4.68 Mil/uL (ref 3.87–5.11)
RDW: 12.5 % (ref 11.5–15.5)
WBC: 5.5 10*3/uL (ref 4.0–10.5)

## 2014-06-03 LAB — BASIC METABOLIC PANEL
BUN: 16 mg/dL (ref 6–23)
CHLORIDE: 107 meq/L (ref 96–112)
CO2: 21 mEq/L (ref 19–32)
Calcium: 9.8 mg/dL (ref 8.4–10.5)
Creatinine, Ser: 0.9 mg/dL (ref 0.4–1.2)
GFR: 73.22 mL/min (ref >60.00)
Glucose, Bld: 93 mg/dL (ref 70–99)
Potassium: 4.2 mEq/L (ref 3.5–5.1)
Sodium: 142 mEq/L (ref 135–145)

## 2014-06-03 LAB — HEPATIC FUNCTION PANEL
ALBUMIN: 4.1 g/dL (ref 3.5–5.2)
ALK PHOS: 97 U/L (ref 39–117)
ALT: 20 U/L (ref 0–35)
AST: 22 U/L (ref 0–37)
Bilirubin, Direct: 0.1 mg/dL (ref 0.0–0.3)
TOTAL PROTEIN: 7.4 g/dL (ref 6.0–8.3)
Total Bilirubin: 0.8 mg/dL (ref 0.2–1.2)

## 2014-06-03 LAB — LIPID PANEL
CHOL/HDL RATIO: 3
Cholesterol: 228 mg/dL — ABNORMAL HIGH (ref 0–200)
HDL: 67.9 mg/dL (ref >39.00)
LDL CALC: 142 mg/dL — AB (ref 0–99)
NONHDL: 160.1
Triglycerides: 92 mg/dL (ref 0.0–149.0)
VLDL: 18.4 mg/dL (ref 0.0–40.0)

## 2014-06-03 LAB — TSH: TSH: 2 u[IU]/mL (ref 0.35–4.50)

## 2014-06-10 ENCOUNTER — Encounter: Payer: Self-pay | Admitting: Internal Medicine

## 2014-06-10 ENCOUNTER — Ambulatory Visit (INDEPENDENT_AMBULATORY_CARE_PROVIDER_SITE_OTHER): Payer: 59 | Admitting: Internal Medicine

## 2014-06-10 VITALS — BP 124/82 | Temp 98.5°F | Ht 66.25 in | Wt 189.0 lb

## 2014-06-10 DIAGNOSIS — E785 Hyperlipidemia, unspecified: Secondary | ICD-10-CM | POA: Insufficient documentation

## 2014-06-10 DIAGNOSIS — Z23 Encounter for immunization: Secondary | ICD-10-CM

## 2014-06-10 DIAGNOSIS — G2581 Restless legs syndrome: Secondary | ICD-10-CM

## 2014-06-10 DIAGNOSIS — Z0000 Encounter for general adult medical examination without abnormal findings: Secondary | ICD-10-CM

## 2014-06-10 MED ORDER — TRAMADOL HCL 50 MG PO TABS: 50.0000 mg | ORAL_TABLET | Freq: Every evening | ORAL | Status: DC | PRN

## 2014-06-10 NOTE — Patient Instructions (Signed)
Continue lifestyle intervention healthy eating and exercise .  Healthy lifestyle includes : At least 150 minutes of exercise weeks  , weight at healthy levels, which is usually   BMI 19-25. Avoid trans fats and processed foods;  Increase fresh fruits and veges to 5 servings per day. And avoid sweet beverages including tea and juice. Mediterranean diet with olive oil and nuts have been noted to be heart and brain healthy . Avoid tobacco products . Limit  alcohol to  7 per week for women and 14 servings for men.  Get adequate sleep . Wear seat belts . Don't text and drive .   ROV in 6 months for med check   we can get iron level at some time in future  as it wasn't done as planned but you seem to be doing well on medication

## 2014-06-10 NOTE — Progress Notes (Signed)
Pre visit review using our clinic review tool, if applicable. No additional management support is needed unless otherwise documented below in the visit note.  Chief Complaint  Patient presents with  . Annual Exam    HPI: Patient comes in today for Preventive Health Care visit  No change since last visit tramadol very much helps her nighttime symptoms and controls her restless leg. She now has hired a Psychologist, educationaltrainer to help healthy lifestyle exercise and losing weight. She is having some menopausal symptoms. Respiratory status is stable at this point takes Flonase as needed pro-air is needed.  Health Maintenance  Topic Date Due  . Colonoscopy  12/04/2010  . Influenza Vaccine  04/03/2015  . Mammogram  09/25/2015  . Pap Smear  07/03/2016  . Tetanus/tdap  03/23/2024   Health Maintenance Review LIFESTYLE:  Exercise:   3 x per week  Trying to lose weight  Tobacco/ETS: no Alcohol: per day  Rare  Sugar beverages: no Sleep: 7 hours  Drug use: no Colonoscopy: to go [facility]  January . ROS:  GEN/ HEENT: No fever, significant weight changes sweats headaches vision problems hearing changes, CV/ PULM; No chest pain shortness of breath cough, syncope,edema  change in exercise tolerance. GI /GU: No adominal pain, vomiting, change in bowel habits. No blood in the stool. No significant GU symptoms. SKIN/HEME: ,no acute skin rashes suspicious lesions or bleeding. No lymphadenopathy, nodules, masses.  NEURO/ PSYCH:  No neurologic signs such as weakness numbness. No depression anxiety. IMM/ Allergy: No unusual infections.  Allergy .   REST of 12 system review negative except as per HPI   Past Medical History  Diagnosis Date  . Allergy   . Asthma   . Ulcerative (chronic) proctitis     Family History  Problem Relation Age of Onset  . Hyperlipidemia Mother     HT  . Heart disease Father     stents died of cva     History   Social History  . Marital Status: Married    Spouse Name: N/A   Number of Children: N/A  . Years of Education: N/A   Social History Main Topics  . Smoking status: Never Smoker   . Smokeless tobacco: Never Used  . Alcohol Use: Yes  . Drug Use: No  . Sexual Activity: None   Other Topics Concern  . None   Social History Narrative   orig fr [place]   hh of 2    1 pet dog.   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  Soon       g2p2    Outpatient Encounter Prescriptions as of 06/10/2014  Medication Sig  . hyoscyamine (LEVSIN SL) 0.125 MG SL tablet PLACE 1 TABLET UNDER THE TONGUE EVERY 4 HOURS AS NEEDED FOR CRAMPING  . PROAIR HFA 108 (90 BASE) MCG/ACT inhaler INHALE 2 PUFFS INTO THE LUNGS EVERY 6 HOURS AS NEEDED FOR WHEEZING  . traMADol (ULTRAM) 50 MG tablet Take 1 tablet (50 mg total) by mouth at bedtime as needed (for restless legs).  . [DISCONTINUED] traMADol (ULTRAM) 50 MG tablet Take 1 tablet (50 mg total) by mouth at bedtime as needed (for restless legs).    EXAM:  BP 124/82  Temp(Src) 98.5 F (36.9 C) (Oral)  Ht 5' 6.25" (1.683 m)  Wt 189 lb (85.73 kg)  BMI 30.27 kg/m2  Body mass index is 30.27 kg/(m^2).  Physical Exam: Vital  signs reviewed KGM:WNUUGEN:This is a well-developed well-nourished alert cooperative    who appearsr stated age in no acute distress.  HEENT: normocephalic atraumatic , Eyes: PERRL EOM's full, conjunctiva clear, Nares: paten,t no deformity discharge or tenderness., Ears: no deformity EAC's clear TMs with normal landmarks. Mouth: clear OP, no lesions, edema.  Moist mucous membranes. Dentition in adequate repair. NECK: supple without masses, thyromegaly or bruits. CHEST/PULM:  Clear to auscultation and percussion breath sounds equal no wheeze , rales or rhonchi. No chest wall deformities or tenderness. CV: PMI is nondisplaced, S1 S2 no gallops, murmurs, rubs. Peripheral pulses are full without delay.No JVD .  Breast: normal by inspection . No dimpling, discharge,  masses, tenderness or discharge . ABDOMEN: Bowel sounds normal nontender  No guard or rebound, no hepato splenomegal no CVA tenderness.  No hernia. Extremtities:  No clubbing cyanosis or edema, no acute joint swelling or redness no focal atrophy NEURO:  Oriented x3, cranial nerves 3-12 appear to be intact, no obvious focal weakness,gait within normal limits no abnormal reflexes or asymmetrical SKIN: No acute rashes normal turgor, color, no bruising or petechiae. PSYCH: Oriented, good eye contact, no obvious depression anxiety, cognition and judgment appear normal. LN: no cervical axillary inguinal adenopathy  Lab Results  Component Value Date   WBC 5.5 06/03/2014   HGB 14.1 06/03/2014   HCT 41.8 06/03/2014   PLT 315.0 06/03/2014   GLUCOSE 93 06/03/2014   CHOL 228* 06/03/2014   TRIG 92.0 06/03/2014   HDL 67.90 06/03/2014   LDLCALC 142* 06/03/2014   ALT 20 06/03/2014   AST 22 06/03/2014   NA 142 06/03/2014   K 4.2 06/03/2014   CL 107 06/03/2014   CREATININE 0.9 06/03/2014   BUN 16 06/03/2014   CO2 21 06/03/2014   TSH 2.00 06/03/2014   Records from Dr. Zachery DauerBarnes reviewed. ASSESSMENT AND PLAN:  Discussed the following assessment and plan:  Visit for preventive health examination  RESTLESS LEG SYNDROME - Controlled by tramadol one at night will take over refill 90x1. Get iron levels in the future next blood draw  Hyperlipidemia - Lifestyle intervention continue advised  Need for prophylactic vaccination and inoculation against influenza - Plan: Flu Vaccine QUAD 36+ mos PF IM (Fluarix Quad PF)  Patient Care Team: Madelin HeadingsWanda K Cherie Lasalle, MD as PCP - General (Internal Medicine) Philip AspenSidney Callahan, DO as Consulting Physician (Obstetrics and Gynecology) Patient Instructions  Continue lifestyle intervention healthy eating and exercise .  Healthy lifestyle includes : At least 150 minutes of exercise weeks  , weight at healthy levels, which is usually   BMI 19-25. Avoid trans fats and processed foods;   Increase fresh fruits and veges to 5 servings per day. And avoid sweet beverages including tea and juice. Mediterranean diet with olive oil and nuts have been noted to be heart and brain healthy . Avoid tobacco products . Limit  alcohol to  7 per week for women and 14 servings for men.  Get adequate sleep . Wear seat belts . Don't text and drive .   ROV in 6 months for med check   we can get iron level at some time in future  as it wasn't done as planned but you seem to be doing well on medication  Burna MortimerWanda K. Kylo Gavin M.D.

## 2014-07-04 ENCOUNTER — Encounter: Payer: Self-pay | Admitting: Internal Medicine

## 2014-07-26 ENCOUNTER — Encounter: Payer: Self-pay | Admitting: Internal Medicine

## 2014-10-18 ENCOUNTER — Encounter: Payer: Self-pay | Admitting: Internal Medicine

## 2014-10-18 ENCOUNTER — Ambulatory Visit (INDEPENDENT_AMBULATORY_CARE_PROVIDER_SITE_OTHER): Payer: 59 | Admitting: Internal Medicine

## 2014-10-18 VITALS — BP 112/72 | Temp 97.7°F | Wt 198.0 lb

## 2014-10-18 DIAGNOSIS — R05 Cough: Secondary | ICD-10-CM

## 2014-10-18 DIAGNOSIS — R053 Chronic cough: Secondary | ICD-10-CM

## 2014-10-18 DIAGNOSIS — J329 Chronic sinusitis, unspecified: Secondary | ICD-10-CM

## 2014-10-18 MED ORDER — HYDROCODONE-HOMATROPINE 5-1.5 MG/5ML PO SYRP: 5.0000 mL | ORAL_SOLUTION | ORAL | Status: DC | PRN

## 2014-10-18 MED ORDER — AMOXICILLIN-POT CLAVULANATE 875-125 MG PO TABS: 1.0000 | ORAL_TABLET | Freq: Two times a day (BID) | ORAL | Status: DC

## 2014-10-18 NOTE — Progress Notes (Signed)
Chief Complaint  Patient presents with  . Cough    HPI: Rachel Huffman 54 y.o. comes in for SDA  Sx ongong for 2 weeks and worse over week end with facial pain and pressure Began runny nose like a cold and cough  And now cough at ngith bad  And cough at night . And face pressures about 5 days ago.   Not a lot of drainage.  No cp sob  Some tight ness . Getting worse instead of better  Now with ur sx .   otc meds not helpful.  Tried inhaler no help.  No fever . NOt like her asthma Cough bac and husband told her to come to get checked ROS: See pertinent positives and negatives per HPI. No vomiting fever chills   Past Medical History  Diagnosis Date  . Allergy   . Asthma   . Ulcerative (chronic) proctitis     Family History  Problem Relation Age of Onset  . Hyperlipidemia Mother     HT  . Heart disease Father     stents died of cva     History   Social History  . Marital Status: Married    Spouse Name: N/A  . Number of Children: N/A  . Years of Education: N/A   Social History Main Topics  . Smoking status: Never Smoker   . Smokeless tobacco: Never Used  . Alcohol Use: Yes  . Drug Use: No  . Sexual Activity: Not on file   Other Topics Concern  . None   Social History Narrative   orig fr [place]   hh of 2    1 pet dog.   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  Soon       g2p2    Outpatient Encounter Prescriptions as of 10/18/2014  Medication Sig  . PROAIR HFA 108 (90 BASE) MCG/ACT inhaler INHALE 2 PUFFS INTO THE LUNGS EVERY 6 HOURS AS NEEDED FOR WHEEZING  . traMADol (ULTRAM) 50 MG tablet Take 1 tablet (50 mg total) by mouth at bedtime as needed (for restless legs).  Marland Kitchen amoxicillin-clavulanate (AUGMENTIN) 875-125 MG per tablet Take 1 tablet by mouth every 12 (twelve) hours.  Marland Kitchen HYDROcodone-homatropine (HYCODAN) 5-1.5 MG/5ML syrup Take 5 mLs by mouth every 4 (four) hours as needed for cough. 1-2 tsp  .  hyoscyamine (LEVSIN SL) 0.125 MG SL tablet PLACE 1 TABLET UNDER THE TONGUE EVERY 4 HOURS AS NEEDED FOR CRAMPING (Patient not taking: Reported on 10/18/2014)    EXAM:  BP 112/72 mmHg  Temp(Src) 97.7 F (36.5 C)  Wt 198 lb (89.812 kg)  Body mass index is 31.71 kg/(m^2). WDWN in NAD  quiet respirations; mildly congested  somewhat hoarse. Non toxic . HEENT: Normocephalic ;atraumatic , Eyes;  PERRL, EOMs  Full, lids and conjunctiva clear,,Ears: no deformities, canals nl, TM landmarks normal, Nose: no deformity or discharge but congested;face maxillary  tender Mouth : OP clear without lesion or edema . Neck: Supple without adenopathy or masses or bruits Chest:  Clear to A&P without wheezes rales or rhonchi CV:  S1-S2 no gallops or murmurs peripheral perfusion is normal Skin :nl perfusion and no acute rashes   ASSESSMENT AND PLAN:  Discussed the following assessment and plan:  Sinusitis, unspecified chronicity, unspecified location  Cough, persistent Because of length of illness and relapsing nature  Antibiotics may be helpful with other measures  Expectant management.  -Patient advised to return or notify health care team  if symptoms worsen ,persist or new concerns arise.  Patient Instructions  acts like a sinusitis.   Add antibiotic saline nose spray and flonase  Expect improvement in the next 5 days or so .  Contact us if not getting better    Cough, Adult  A cough is a reflex that helps clear your throat and airways. It can help heal the body or may be a reaction to an irritated airway. A cough may only last 2 or 3 weeks (acute) or may last more than 8 weeks (chronic).  CAUSES Acute cough:  Viral or bacterial infections. Chronic cough:  Infections.  Allergies.  Asthma.  Post-nasal drip.  Smoking.  Heartburn or acid reflux.  Some medicines.  Chronic lung problems (COPD).  Cancer. SYMPTOMS   Cough.  Fever.  Chest pain.  Increased breathing  rate.  High-pitched whistling sound when breathing (wheezing).  Colored mucus that you cough up (sputum). TREATMENT   A bacterial cough may be treated with antibiotic medicine.  A viral cough must run its course and will not respond to antibiotics.  Your caregiver may recommend other treatments if you have a chronic cough. HOME CARE INSTRUCTIONS   Only take over-the-counter or prescription medicines for pain, discomfort, or fever as directed by your caregiver. Use cough suppressants only as directed by your caregiver.  Use a cold steam vaporizer or humidifier in your bedroom or home to help loosen secretions.  Sleep in a semi-upright position if your cough is worse at night.  Rest as needed.  Stop smoking if you smoke. SEEK IMMEDIATE MEDICAL CARE IF:   You have pus in your sputum.  Your cough starts to worsen.  You cannot control your cough with suppressants and are losing sleep.  You begin coughing up blood.  You have difficulty breathing.  You develop pain which is getting worse or is uncontrolled with medicine.  You have a fever. MAKE SURE YOU:   Understand these instructions.  Will watch your condition.  Will get help right away if you are not doing well or get worse. Document Released: 02/15/2011 Document Revised: 11/11/2011 Document Reviewed: 02/15/2011 [facility] Patient Information 2015 [place], [place]. This information is not intended to replace advice given to you by your health care provider. Make sure you discuss any questions you have with your health care provider.      Neta MendsWanda K. Tullio Chausse M.D.

## 2014-10-18 NOTE — Progress Notes (Signed)
Pre visit review using our clinic review tool, if applicable. No additional management support is needed unless otherwise documented below in the visit note. 

## 2014-10-18 NOTE — Patient Instructions (Addendum)
acts like a sinusitis.   Add antibiotic saline nose spray and flonase  Expect improvement in the next 5 days or so .  Contact us if not getting better    Cough, Adult  A cough is a reflex that helps clear your throat and airways. It can help heal the body or may be a reaction to an irritated airway. A cough may only last 2 or 3 weeks (acute) or may last more than 8 weeks (chronic).  CAUSES Acute cough:  Viral or bacterial infections. Chronic cough:  Infections.  Allergies.  Asthma.  Post-nasal drip.  Smoking.  Heartburn or acid reflux.  Some medicines.  Chronic lung problems (COPD).  Cancer. SYMPTOMS   Cough.  Fever.  Chest pain.  Increased breathing rate.  High-pitched whistling sound when breathing (wheezing).  Colored mucus that you cough up (sputum). TREATMENT   A bacterial cough may be treated with antibiotic medicine.  A viral cough must run its course and will not respond to antibiotics.  Your caregiver may recommend other treatments if you have a chronic cough. HOME CARE INSTRUCTIONS   Only take over-the-counter or prescription medicines for pain, discomfort, or fever as directed by your caregiver. Use cough suppressants only as directed by your caregiver.  Use a cold steam vaporizer or humidifier in your bedroom or home to help loosen secretions.  Sleep in a semi-upright position if your cough is worse at night.  Rest as needed.  Stop smoking if you smoke. SEEK IMMEDIATE MEDICAL CARE IF:   You have pus in your sputum.  Your cough starts to worsen.  You cannot control your cough with suppressants and are losing sleep.  You begin coughing up blood.  You have difficulty breathing.  You develop pain which is getting worse or is uncontrolled with medicine.  You have a fever. MAKE SURE YOU:   Understand these instructions.  Will watch your condition.  Will get help right away if you are not doing well or get worse. Document  Released: 02/15/2011 Document Revised: 11/11/2011 Document Reviewed: 02/15/2011 [facility] Patient Information 2015 [place], [place]. This information is not intended to replace advice given to you by your health care provider. Make sure you discuss any questions you have with your health care provider.

## 2014-11-28 ENCOUNTER — Telehealth: Payer: Self-pay | Admitting: Internal Medicine

## 2014-11-28 NOTE — Telephone Encounter (Signed)
Patient is requesting re-fill on traMADol (ULTRAM) 50 MG tablet. Patient states the request is 2 weeks early.  [phone] - [place] [place] - [address] [facility] [address]

## 2014-11-28 NOTE — Telephone Encounter (Signed)
Ok to refill x 1 #90  Visit date not found Due for med check before runs out of med

## 2014-11-29 MED ORDER — TRAMADOL HCL 50 MG PO TABS: 50.0000 mg | ORAL_TABLET | Freq: Every evening | ORAL | Status: DC | PRN

## 2014-11-29 NOTE — Telephone Encounter (Signed)
Patient called back and states she will callback to schedule an appointment.

## 2014-11-29 NOTE — Telephone Encounter (Signed)
LMOM for patient to schedule med check appointment.

## 2014-11-29 NOTE — Telephone Encounter (Signed)
I have called the prescription to the pharmacy.  Please help the pt get an appt before this refill runs out.  Thanks!

## 2015-02-15 ENCOUNTER — Encounter: Payer: Self-pay | Admitting: Internal Medicine

## 2015-02-15 ENCOUNTER — Ambulatory Visit (INDEPENDENT_AMBULATORY_CARE_PROVIDER_SITE_OTHER): Payer: 59 | Admitting: Internal Medicine

## 2015-02-15 VITALS — BP 126/90 | Temp 98.2°F | Ht 66.25 in | Wt 198.1 lb

## 2015-02-15 DIAGNOSIS — J310 Chronic rhinitis: Secondary | ICD-10-CM

## 2015-02-15 DIAGNOSIS — G2581 Restless legs syndrome: Secondary | ICD-10-CM | POA: Diagnosis not present

## 2015-02-15 DIAGNOSIS — Z7189 Other specified counseling: Secondary | ICD-10-CM | POA: Diagnosis not present

## 2015-02-15 DIAGNOSIS — Z79899 Other long term (current) drug therapy: Secondary | ICD-10-CM

## 2015-02-15 MED ORDER — ALBUTEROL SULFATE HFA 108 (90 BASE) MCG/ACT IN AERS: INHALATION_SPRAY | RESPIRATORY_TRACT | Status: DC

## 2015-02-15 MED ORDER — TRAMADOL HCL 50 MG PO TABS: 50.0000 mg | ORAL_TABLET | Freq: Every evening | ORAL | Status: DC | PRN

## 2015-02-15 NOTE — Progress Notes (Signed)
Pre visit review using our clinic review tool, if applicable. No additional management support is needed unless otherwise documented below in the visit note.  Chief Complaint  Patient presents with  . Follow-up    rls, all asthma    HPI: Rachel Huffman  54 y.o.    Comes in rls syndrome.  Med evaluation . Nocturnal kicking is suppressed  Fine on med  Tramadol once at night   Goes to gym no problem exercising  cv pulm  Albuterol oinha;ler as needed no more than once a week    Take claritin d  12 hours   Tight clogged head ask for other suggestions . No wateriness  HCMcolonscopy due  handt done yet  .mammo  to get due pap next year    ROS: See pertinent positives and negatives per HPI. No cp sob  Currently fever sleep apnea  Past Medical History  Diagnosis Date  . Allergy   . Asthma   . Ulcerative (chronic) proctitis     Family History  Problem Relation Age of Onset  . Hyperlipidemia Mother     HT  . Heart disease Father     stents died of cva     History   Social History  . Marital Status: Married    Spouse Name: N/A  . Number of Children: N/A  . Years of Education: N/A   Social History Main Topics  . Smoking status: Never Smoker   . Smokeless tobacco: Never Used  . Alcohol Use: Yes  . Drug Use: No  . Sexual Activity: Not on file   Other Topics Concern  . None   Social History Narrative   orig fr [place]   hh of 2    1 pet dog.   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  Soon       g2p2    Outpatient Prescriptions Prior to Visit  Medication Sig Dispense Refill  . PROAIR HFA 108 (90 BASE) MCG/ACT inhaler INHALE 2 PUFFS INTO THE LUNGS EVERY 6 HOURS AS NEEDED FOR WHEEZING 17 g 1  . traMADol (ULTRAM) 50 MG tablet Take 1 tablet (50 mg total) by mouth at bedtime as needed (for restless legs). 90 tablet 0  . hyoscyamine (LEVSIN SL) 0.125 MG SL tablet PLACE 1 TABLET UNDER THE TONGUE EVERY 4 HOURS AS  NEEDED FOR CRAMPING (Patient not taking: Reported on 10/18/2014) 30 tablet 2  . amoxicillin-clavulanate (AUGMENTIN) 875-125 MG per tablet Take 1 tablet by mouth every 12 (twelve) hours. 14 tablet 0  . HYDROcodone-homatropine (HYCODAN) 5-1.5 MG/5ML syrup Take 5 mLs by mouth every 4 (four) hours as needed for cough. 1-2 tsp 180 mL 0   No facility-administered medications prior to visit.     EXAM:  BP 126/90 mmHg  Temp(Src) 98.2 F (36.8 C) (Oral)  Ht 5' 6.25" (1.683 m)  Wt 198 lb 1.6 oz (89.858 kg)  BMI 31.72 kg/m2  Body mass index is 31.72 kg/(m^2).  GENERAL: vitals reviewed and listed above, alert, oriented, appears well hydrated and in no acute distress HEENT: atraumatic, conjunctiva  clear, no obvious abnormalities on inspection of external nose and ears OP : no lesion edema or exudate  NECK: no obvious masses on inspection palpation  LUNGS: clear to auscultation bilaterally, no wheezes, rales or rhonchi, good air movement CV: HRRR, no clubbing cyanosis or  peripheral edema nl cap refill  MS:  moves all extremities without noticeable focal  abnormality PSYCH: pleasant and cooperative, no obvious depression or anxiety Lab Results  Component Value Date   WBC 5.5 06/03/2014   HGB 14.1 06/03/2014   HCT 41.8 06/03/2014   PLT 315.0 06/03/2014   GLUCOSE 93 06/03/2014   CHOL 228* 06/03/2014   TRIG 92.0 06/03/2014   HDL 67.90 06/03/2014   LDLCALC 142* 06/03/2014   ALT 20 06/03/2014   AST 22 06/03/2014   NA 142 06/03/2014   K 4.2 06/03/2014   CL 107 06/03/2014   CREATININE 0.9 06/03/2014   BUN 16 06/03/2014   CO2 21 06/03/2014   TSH 2.00 06/03/2014    ASSESSMENT AND PLAN:  Discussed the following assessment and plan:  RESTLESS LEG SYNDROME  Controlled restless leg syndrome - benefit mor than risk of med at this time  Chronic rhinitis - poss allergic   Medication management  Counseling on health promotion and disease prevention - get colon screen and mammo Disc.   options for stuffiness and use of nasal cortisone  chronically to work can call about other options contact if inc inhaler use. -Patient advised to return or notify health care team  if symptoms worsen ,persist or new concerns arise.  Patient Instructions  Get  Your colonscopy.  Albuterol as needed . If having to use  A lot come back in .   Take nasal cortisone every day Flonase or Nasacort.   . suppress  Allergy sx   and can add on a plane decongesting as needed. If this isn't working contact us consider adding Singulair in addition to the nasal cortisone. Continue your tramadol 1 at night if not can trolling the problem we can reevaluate her get consult.    Neta Mends. Burma Ketcher M.D.

## 2015-02-15 NOTE — Patient Instructions (Addendum)
Get  Your colonscopy.  Albuterol as needed . If having to use  A lot come back in .   Take nasal cortisone every day Flonase or Nasacort.   . suppress  Allergy sx   and can add on a plane decongesting as needed. If this isn't working contact us consider adding Singulair in addition to the nasal cortisone. Continue your tramadol 1 at night if not can trolling the problem we can reevaluate her get consult.

## 2015-06-27 ENCOUNTER — Encounter: Payer: Self-pay | Admitting: Internal Medicine

## 2015-06-27 ENCOUNTER — Ambulatory Visit (INDEPENDENT_AMBULATORY_CARE_PROVIDER_SITE_OTHER): Payer: 59 | Admitting: Internal Medicine

## 2015-06-27 VITALS — BP 130/80 | Temp 98.5°F | Wt 195.0 lb

## 2015-06-27 DIAGNOSIS — Z23 Encounter for immunization: Secondary | ICD-10-CM | POA: Diagnosis not present

## 2015-06-27 DIAGNOSIS — S39012A Strain of muscle, fascia and tendon of lower back, initial encounter: Secondary | ICD-10-CM | POA: Diagnosis not present

## 2015-06-27 MED ORDER — CYCLOBENZAPRINE HCL 5 MG PO TABS: 5.0000 mg | ORAL_TABLET | Freq: Three times a day (TID) | ORAL | Status: DC | PRN

## 2015-06-27 NOTE — Progress Notes (Signed)
Pre visit review using our clinic review tool, if applicable. No additional management support is needed unless otherwise documented below in the visit note.  Chief Complaint  Patient presents with  . Back Pain    HPI: Patient Rachel Huffman  comes in today for SDA for  new problem evaluation. Acute back pain . Onset after yesterday.  Had water leak and had to pull extremely heavy box forward  And yanking to turn the water off that was leaking through her kitchen. Later that day in the evening she had onset of low back pain and spasm that has become more severe it is hard to lay down sit do most things. She has taking ibuprofen 800 mg 1 time.Associated fever. She tried heat one time.  Never had back pain strain significant sciatica or family history of significance. No fever UTI systemic symptoms. r .   ROS: See pertinent positives and negatives per HPI.  Past Medical History  Diagnosis Date  . Allergy   . Asthma   . Ulcerative (chronic) proctitis (HCC)     Family History  Problem Relation Age of Onset  . Hyperlipidemia Mother     HT  . Heart disease Father     stents died of cva     Social History   Social History  . Marital Status: Married    Spouse Name: N/A  . Number of Children: N/A  . Years of Education: N/A   Social History Main Topics  . Smoking status: Never Smoker   . Smokeless tobacco: Never Used  . Alcohol Use: Yes  . Drug Use: No  . Sexual Activity: Not Asked   Other Topics Concern  . None   Social History Narrative   orig fr [place]   hh of 2    1 pet dog.   Work part time  Field seismologist   Family business with husband   [place] children  to  be born .  Soon       g2p2    Outpatient Prescriptions Prior to Visit  Medication Sig Dispense Refill  . albuterol (PROAIR HFA) 108 (90 BASE) MCG/ACT inhaler INHALE 2 PUFFS INTO THE LUNGS EVERY 6 HOURS AS NEEDED FOR WHEEZING 17 g 2  . traMADol (ULTRAM) 50 MG tablet Take 1 tablet (50 mg total) by  mouth at bedtime as needed (for restless legs). 90 tablet 3  . hyoscyamine (LEVSIN SL) 0.125 MG SL tablet PLACE 1 TABLET UNDER THE TONGUE EVERY 4 HOURS AS NEEDED FOR CRAMPING (Patient not taking: Reported on 10/18/2014) 30 tablet 2   No facility-administered medications prior to visit.     EXAM:  BP 130/80 mmHg  Temp(Src) 98.5 F (36.9 C) (Oral)  Wt 195 lb (88.451 kg)  Body mass index is 31.23 kg/(m^2).  GENERAL: vitals reviewed and listed above, alert, oriented, appears well hydrated and in no acute distress uncomfortable when moving gait is easy HEENT: atraumatic, conjunctiva  clear, no obvious abnormalities on inspection of external nose and ears CV: HRRR, no clubbing cyanosis or  peripheral edema nl cap refill  Back negative midline spiny tenderness there is spasm in the para lumbar area right more than left toe heel walk is normal no weakness.  MS: moves all extremities without noticeable focal  abnormality PSYCH: pleasant and cooperative, no obvious depression or anxiety  ASSESSMENT AND PLAN:  Discussed the following assessment and plan:  Low back strain, initial encounter - no alarm features expectant managment ice nsiads stretch  mrl at night with caution  Need for prophylactic vaccination and inoculation against influenza - Plan: Flu Vaccine QUAD 36+ mos PF IM (Fluarix & Fluzone Quad PF)  acute back strain fortunately no alarm symptoms and no set up expectant management use ice instead of heat rehabilitation exercises when improving can try muscle relaxants at night anti-inflammatories. Physical modalities. -Patient advised to return or notify health care team  if symptoms worsen ,persist or new concerns arise.  Patient Instructions  Relative  rest  Avoid prolonged sitting no lifting  Until better. Activity .  as tolerated .  Should improve greatly in the next 2 weeks   Antiinflammatories   800 mg ibuprofen every 8 hours OR  2.5 aleve generic   Twice a day . Ice  Stretch  ok if helpt     Lumbosacral Strain Lumbosacral strain is a strain of any of the parts that make up your lumbosacral vertebrae. Your lumbosacral vertebrae are the bones that make up the lower third of your backbone. Your lumbosacral vertebrae are held together by muscles and tough, fibrous tissue (ligaments).  CAUSES  A sudden blow to your back can cause lumbosacral strain. Also, anything that causes an excessive stretch of the muscles in the low back can cause this strain. This is typically seen when people exert themselves strenuously, fall, lift heavy objects, bend, or crouch repeatedly. RISK FACTORS 1. Physically demanding work. 2. Participation in pushing or pulling sports or sports that require a sudden twist of the back (tennis, golf, baseball). 3. Weight lifting. 4. Excessive lower back curvature. 5. Forward-tilted pelvis. 6. Weak back or abdominal muscles or both. 7. Tight hamstrings. SIGNS AND SYMPTOMS  Lumbosacral strain may cause pain in the area of your injury or pain that moves (radiates) down your leg.  DIAGNOSIS Your health care provider can often diagnose lumbosacral strain through a physical exam. In some cases, you may need tests such as X-ray exams.  TREATMENT  Treatment for your lower back injury depends on many factors that your clinician will have to evaluate. However, most treatment will include the use of anti-inflammatory medicines. HOME CARE INSTRUCTIONS  1. Avoid hard physical activities (tennis, racquetball, waterskiing) if you are not in proper physical condition for it. This may aggravate or create problems. 2. If you have a back problem, avoid sports requiring sudden body movements. Swimming and walking are generally safer activities. 3. Maintain good posture. 4. Maintain a healthy weight. 5. For acute conditions, you may put ice on the injured area. 1. Put ice in a plastic bag. 2. Place a towel between your skin and the bag. 3. Leave the ice on for 20  minutes, 2-3 times a day. 6. When the low back starts healing, stretching and strengthening exercises may be recommended. SEEK MEDICAL CARE IF: 1. Your back pain is getting worse. 2. You experience severe back pain not relieved with medicines. SEEK IMMEDIATE MEDICAL CARE IF:  1. You have numbness, tingling, weakness, or problems with the use of your arms or legs. 2. There is a change in bowel or bladder control. 3. You have increasing pain in any area of the body, including your belly (abdomen). 4. You notice shortness of breath, dizziness, or feel faint. 5. You feel sick to your stomach (nauseous), are throwing up (vomiting), or become sweaty. 6. You notice discoloration of your toes or legs, or your feet get very cold. MAKE SURE YOU:  1. Understand these instructions. 2. Will watch your condition. 3. Will get  help right away if you are not doing well or get worse.   This information is not intended to replace advice given to you by your health care provider. Make sure you discuss any questions you have with your health care provider.   Document Released: 05/29/2005 Document Revised: 09/09/2014 Document Reviewed: 04/07/2013 Elsevier Interactive Patient Education 2016 [employer]   Back Exercises The following exercises strengthen the muscles that help to support the back. They also help to keep the lower back flexible. Doing these exercises can help to prevent back pain or lessen existing pain. If you have back pain or discomfort, try doing these exercises 2-3 times each day or as told by your health care provider. When the pain goes away, do them once each day, but increase the number of times that you repeat the steps for each exercise (do more repetitions). If you do not have back pain or discomfort, do these exercises once each day or as told by your health care provider. EXERCISES Single Knee to Chest Repeat these steps 3-5 times for each leg: 8. Lie on your back on a firm bed  or the floor with your legs extended. 9. Bring one knee to your chest. Your other leg should stay extended and in contact with the floor. 10. Hold your knee in place by grabbing your knee or thigh. 11. Pull on your knee until you feel a gentle stretch in your lower back. 12. Hold the stretch for 10-30 seconds. 13. Slowly release and straighten your leg. Pelvic Tilt Repeat these steps 5-10 times: 7. Lie on your back on a firm bed or the floor with your legs extended. 8. Bend your knees so they are pointing toward the ceiling and your feet are flat on the floor. 9. Tighten your lower abdominal muscles to press your lower back against the floor. This motion will tilt your pelvis so your tailbone points up toward the ceiling instead of pointing to your feet or the floor. 10. With gentle tension and even breathing, hold this position for 5-10 seconds. Cat-Cow Repeat these steps until your lower back becomes more flexible: 3. Get into a hands-and-knees position on a firm surface. Keep your hands under your shoulders, and keep your knees under your hips. You may place padding under your knees for comfort. 4. Let your head hang down, and point your tailbone toward the floor so your lower back becomes rounded like the back of a cat. 5. Hold this position for 5 seconds. 6. Slowly lift your head and point your tailbone up toward the ceiling so your back forms a sagging arch like the back of a cow. 7. Hold this position for 5 seconds. Press-Ups Repeat these steps 5-10 times: 7. Lie on your abdomen (face-down) on the floor. 8. Place your palms near your head, about shoulder-width apart. 9. While you keep your back as relaxed as possible and keep your hips on the floor, slowly straighten your arms to raise the top half of your body and lift your shoulders. Do not use your back muscles to raise your upper torso. You may adjust the placement of your hands to make yourself more comfortable. 10. Hold this  position for 5 seconds while you keep your back relaxed. 11. Slowly return to lying flat on the floor. Bridges Repeat these steps 10 times: 4. Lie on your back on a firm surface. 5. Bend your knees so they are pointing toward the ceiling and your feet are flat on the  floor. 6. Tighten your buttocks muscles and lift your buttocks off of the floor until your waist is at almost the same height as your knees. You should feel the muscles working in your buttocks and the back of your thighs. If you do not feel these muscles, slide your feet 1-2 inches farther away from your buttocks. 7. Hold this position for 3-5 seconds. 8. Slowly lower your hips to the starting position, and allow your buttocks muscles to relax completely. If this exercise is too easy, try doing it with your arms crossed over your chest. Abdominal Crunches Repeat these steps 5-10 times: 1. Lie on your back on a firm bed or the floor with your legs extended. 2. Bend your knees so they are pointing toward the ceiling and your feet are flat on the floor. 3. Cross your arms over your chest. 4. Tip your chin slightly toward your chest without bending your neck. 5. Tighten your abdominal muscles and slowly raise your trunk (torso) high enough to lift your shoulder blades a tiny bit off of the floor. Avoid raising your torso higher than that, because it can put too much stress on your low back and it does not help to strengthen your abdominal muscles. 6. Slowly return to your starting position. Back Lifts Repeat these steps 5-10 times: 1. Lie on your abdomen (face-down) with your arms at your sides, and rest your forehead on the floor. 2. Tighten the muscles in your legs and your buttocks. 3. Slowly lift your chest off of the floor while you keep your hips pressed to the floor. Keep the back of your head in line with the curve in your back. Your eyes should be looking at the floor. 4. Hold this position for 3-5 seconds. 5. Slowly return  to your starting position. SEEK MEDICAL CARE IF:  Your back pain or discomfort gets much worse when you do an exercise.  Your back pain or discomfort does not lessen within 2 hours after you exercise. If you have any of these problems, stop doing these exercises right away. Do not do them again unless your health care provider says that you can. SEEK IMMEDIATE MEDICAL CARE IF:  You develop sudden, severe back pain. If this happens, stop doing the exercises right away. Do not do them again unless your health care provider says that you can.   This information is not intended to replace advice given to you by your health care provider. Make sure you discuss any questions you have with your health care provider.   Document Released: 09/26/2004 Document Revised: 05/10/2015 Document Reviewed: 10/13/2014 Elsevier Interactive Patient Education 2016 [employer]       GwinnerWanda K. Panosh M.D.

## 2015-06-27 NOTE — Patient Instructions (Addendum)
Relative  rest  Avoid prolonged sitting no lifting  Until better. Activity .  as tolerated .  Should improve greatly in the next 2 weeks   Antiinflammatories   800 mg ibuprofen every 8 hours OR  2.5 aleve generic   Twice a day . Ice  Stretch ok if helpt     Lumbosacral Strain Lumbosacral strain is a strain of any of the parts that make up your lumbosacral vertebrae. Your lumbosacral vertebrae are the bones that make up the lower third of your backbone. Your lumbosacral vertebrae are held together by muscles and tough, fibrous tissue (ligaments).  CAUSES  A sudden blow to your back can cause lumbosacral strain. Also, anything that causes an excessive stretch of the muscles in the low back can cause this strain. This is typically seen when people exert themselves strenuously, fall, lift heavy objects, bend, or crouch repeatedly. RISK FACTORS 1. Physically demanding work. 2. Participation in pushing or pulling sports or sports that require a sudden twist of the back (tennis, golf, baseball). 3. Weight lifting. 4. Excessive lower back curvature. 5. Forward-tilted pelvis. 6. Weak back or abdominal muscles or both. 7. Tight hamstrings. SIGNS AND SYMPTOMS  Lumbosacral strain may cause pain in the area of your injury or pain that moves (radiates) down your leg.  DIAGNOSIS Your health care provider can often diagnose lumbosacral strain through a physical exam. In some cases, you may need tests such as X-ray exams.  TREATMENT  Treatment for your lower back injury depends on many factors that your clinician will have to evaluate. However, most treatment will include the use of anti-inflammatory medicines. HOME CARE INSTRUCTIONS  1. Avoid hard physical activities (tennis, racquetball, waterskiing) if you are not in proper physical condition for it. This may aggravate or create problems. 2. If you have a back problem, avoid sports requiring sudden body movements. Swimming and walking are generally  safer activities. 3. Maintain good posture. 4. Maintain a healthy weight. 5. For acute conditions, you may put ice on the injured area. 1. Put ice in a plastic bag. 2. Place a towel between your skin and the bag. 3. Leave the ice on for 20 minutes, 2-3 times a day. 6. When the low back starts healing, stretching and strengthening exercises may be recommended. SEEK MEDICAL CARE IF: 1. Your back pain is getting worse. 2. You experience severe back pain not relieved with medicines. SEEK IMMEDIATE MEDICAL CARE IF:  1. You have numbness, tingling, weakness, or problems with the use of your arms or legs. 2. There is a change in bowel or bladder control. 3. You have increasing pain in any area of the body, including your belly (abdomen). 4. You notice shortness of breath, dizziness, or feel faint. 5. You feel sick to your stomach (nauseous), are throwing up (vomiting), or become sweaty. 6. You notice discoloration of your toes or legs, or your feet get very cold. MAKE SURE YOU:  1. Understand these instructions. 2. Will watch your condition. 3. Will get help right away if you are not doing well or get worse.   This information is not intended to replace advice given to you by your health care provider. Make sure you discuss any questions you have with your health care provider.   Document Released: 05/29/2005 Document Revised: 09/09/2014 Document Reviewed: 04/07/2013 Elsevier Interactive Patient Education 2016 [employer] Inc[employer]   Back Exercises The following exercises strengthen the muscles that help to support the back. They also help to keep the  lower back flexible. Doing these exercises can help to prevent back pain or lessen existing pain. If you have back pain or discomfort, try doing these exercises 2-3 times each day or as told by your health care provider. When the pain goes away, do them once each day, but increase the number of times that you repeat the steps for each exercise (do more  repetitions). If you do not have back pain or discomfort, do these exercises once each day or as told by your health care provider. EXERCISES Single Knee to Chest Repeat these steps 3-5 times for each leg: 8. Lie on your back on a firm bed or the floor with your legs extended. 9. Bring one knee to your chest. Your other leg should stay extended and in contact with the floor. 10. Hold your knee in place by grabbing your knee or thigh. 11. Pull on your knee until you feel a gentle stretch in your lower back. 12. Hold the stretch for 10-30 seconds. 13. Slowly release and straighten your leg. Pelvic Tilt Repeat these steps 5-10 times: 7. Lie on your back on a firm bed or the floor with your legs extended. 8. Bend your knees so they are pointing toward the ceiling and your feet are flat on the floor. 9. Tighten your lower abdominal muscles to press your lower back against the floor. This motion will tilt your pelvis so your tailbone points up toward the ceiling instead of pointing to your feet or the floor. 10. With gentle tension and even breathing, hold this position for 5-10 seconds. Cat-Cow Repeat these steps until your lower back becomes more flexible: 3. Get into a hands-and-knees position on a firm surface. Keep your hands under your shoulders, and keep your knees under your hips. You may place padding under your knees for comfort. 4. Let your head hang down, and point your tailbone toward the floor so your lower back becomes rounded like the back of a cat. 5. Hold this position for 5 seconds. 6. Slowly lift your head and point your tailbone up toward the ceiling so your back forms a sagging arch like the back of a cow. 7. Hold this position for 5 seconds. Press-Ups Repeat these steps 5-10 times: 7. Lie on your abdomen (face-down) on the floor. 8. Place your palms near your head, about shoulder-width apart. 9. While you keep your back as relaxed as possible and keep your hips on the  floor, slowly straighten your arms to raise the top half of your body and lift your shoulders. Do not use your back muscles to raise your upper torso. You may adjust the placement of your hands to make yourself more comfortable. 10. Hold this position for 5 seconds while you keep your back relaxed. 11. Slowly return to lying flat on the floor. Bridges Repeat these steps 10 times: 4. Lie on your back on a firm surface. 5. Bend your knees so they are pointing toward the ceiling and your feet are flat on the floor. 6. Tighten your buttocks muscles and lift your buttocks off of the floor until your waist is at almost the same height as your knees. You should feel the muscles working in your buttocks and the back of your thighs. If you do not feel these muscles, slide your feet 1-2 inches farther away from your buttocks. 7. Hold this position for 3-5 seconds. 8. Slowly lower your hips to the starting position, and allow your buttocks muscles to relax completely. If  this exercise is too easy, try doing it with your arms crossed over your chest. Abdominal Crunches Repeat these steps 5-10 times: 1. Lie on your back on a firm bed or the floor with your legs extended. 2. Bend your knees so they are pointing toward the ceiling and your feet are flat on the floor. 3. Cross your arms over your chest. 4. Tip your chin slightly toward your chest without bending your neck. 5. Tighten your abdominal muscles and slowly raise your trunk (torso) high enough to lift your shoulder blades a tiny bit off of the floor. Avoid raising your torso higher than that, because it can put too much stress on your low back and it does not help to strengthen your abdominal muscles. 6. Slowly return to your starting position. Back Lifts Repeat these steps 5-10 times: 1. Lie on your abdomen (face-down) with your arms at your sides, and rest your forehead on the floor. 2. Tighten the muscles in your legs and your buttocks. 3. Slowly  lift your chest off of the floor while you keep your hips pressed to the floor. Keep the back of your head in line with the curve in your back. Your eyes should be looking at the floor. 4. Hold this position for 3-5 seconds. 5. Slowly return to your starting position. SEEK MEDICAL CARE IF:  Your back pain or discomfort gets much worse when you do an exercise.  Your back pain or discomfort does not lessen within 2 hours after you exercise. If you have any of these problems, stop doing these exercises right away. Do not do them again unless your health care provider says that you can. SEEK IMMEDIATE MEDICAL CARE IF:  You develop sudden, severe back pain. If this happens, stop doing the exercises right away. Do not do them again unless your health care provider says that you can.   This information is not intended to replace advice given to you by your health care provider. Make sure you discuss any questions you have with your health care provider.   Document Released: 09/26/2004 Document Revised: 05/10/2015 Document Reviewed: 10/13/2014 Elsevier Interactive Patient Education [employer]

## 2015-07-25 ENCOUNTER — Encounter: Payer: Self-pay | Admitting: Internal Medicine

## 2015-07-25 ENCOUNTER — Ambulatory Visit (INDEPENDENT_AMBULATORY_CARE_PROVIDER_SITE_OTHER): Payer: 59 | Admitting: Internal Medicine

## 2015-07-25 VITALS — BP 118/74 | Temp 97.8°F | Wt 197.1 lb

## 2015-07-25 DIAGNOSIS — J320 Chronic maxillary sinusitis: Secondary | ICD-10-CM

## 2015-07-25 DIAGNOSIS — J069 Acute upper respiratory infection, unspecified: Secondary | ICD-10-CM

## 2015-07-25 MED ORDER — HYDROCODONE-HOMATROPINE 5-1.5 MG/5ML PO SYRP: 5.0000 mL | ORAL_SOLUTION | ORAL | Status: DC | PRN

## 2015-07-25 MED ORDER — AMOXICILLIN-POT CLAVULANATE 875-125 MG PO TABS: 1.0000 | ORAL_TABLET | Freq: Two times a day (BID) | ORAL | Status: DC

## 2015-07-25 NOTE — Patient Instructions (Addendum)
  Add antibiotic for prolonged sx of sinus infection . Cough med for comfort caution with sedation . Expect improvement in 3-5 day at lastest .  Chest is good today.

## 2015-07-25 NOTE — Progress Notes (Signed)
Pre visit review using our clinic review tool, if applicable. No additional management support is needed unless otherwise documented below in the visit note.  Chief Complaint  Patient presents with  . Cough  . Sore Throat  . Headache  . Nasal Congestion  . Sinus Pain/Pressure  . Ear Pain    HPI: Patient Rachel Huffman  comes in today for SDA for  new problem evaluation.  2 weeks    Of ur congestion and sinus congestion  Despite otcs and her allergy  med   Now in last 3 days pain right side  And ear sx  Not relieved  . No sob but cough at night  Hard to sleep  Ibu... every 4 hours  No fever.  At this time  No wheeze .  ROS: See pertinent positives and negatives per HPI. No cp sob fever chills   Past Medical History  Diagnosis Date  . Allergy   . Asthma   . Ulcerative (chronic) proctitis (HCC)     Family History  Problem Relation Age of Onset  . Hyperlipidemia Mother     HT  . Heart disease Father     stents died of cva     Social History   Social History  . Marital Status: Married    Spouse Name: N/A  . Number of Children: N/A  . Years of Education: N/A   Social History Main Topics  . Smoking status: Never Smoker   . Smokeless tobacco: Never Used  . Alcohol Use: Yes  . Drug Use: No  . Sexual Activity: Not Asked   Other Topics Concern  . None   Social History Narrative   orig fr [place]   hh of 2    1 pet dog.   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  Soon       g2p2    Outpatient Prescriptions Prior to Visit  Medication Sig Dispense Refill  . albuterol (PROAIR HFA) 108 (90 BASE) MCG/ACT inhaler INHALE 2 PUFFS INTO THE LUNGS EVERY 6 HOURS AS NEEDED FOR WHEEZING 17 g 2  . cyclobenzaprine (FLEXERIL) 5 MG tablet Take 1 tablet (5 mg total) by mouth 3 (three) times daily as needed for muscle spasms. 30 tablet 1  . traMADol (ULTRAM) 50 MG tablet Take 1 tablet (50 mg total) by mouth at bedtime as  needed (for restless legs). 90 tablet 3  . hyoscyamine (LEVSIN SL) 0.125 MG SL tablet PLACE 1 TABLET UNDER THE TONGUE EVERY 4 HOURS AS NEEDED FOR CRAMPING (Patient not taking: Reported on 10/18/2014) 30 tablet 2   No facility-administered medications prior to visit.     EXAM:  BP 118/74 mmHg  Temp(Src) 97.8 F (36.6 C) (Oral)  Wt 197 lb 1.6 oz (89.404 kg)  Body mass index is 31.56 kg/(m^2). WDWN in NAD  quiet respirations; very  congested  somewhat hoarse. Non toxic . HEENT: Normocephalic ;atraumatic , Eyes;  PERRL, EOMs  Full, lids and conjunctiva clear,,Ears: no deformities, canals nl, TM landmarks normal, Nose: no deformity or discharge but clogged congested;face r maxilla nd frontal some  tender Mouth : OP clear without lesion or edema . Neck: Supple without adenopathy or masses or bruits Chest:  Clear to A without wheezes rales or rhonchi CV:  S1-S2 no gallops or murmurs peripheral perfusion is normal Skin :nl perfusion and no acute rashes  PSYCH: pleasant and cooperative,  no obvious depression or anxiety  ASSESSMENT AND PLAN:  Discussed the following assessment and plan:  Right maxillary sinusitis  Protracted URI  -Patient advised to return or notify health care team  if symptoms worsen ,persist or new concerns arise.  Patient Instructions   Add antibiotic for prolonged sx of sinus infection . Cough med for comfort caution with sedation . Expect improvement in 3-5 day at lastest .  Chest is good today.     Neta Mends. Tomiko Schoon M.D.

## 2015-08-03 ENCOUNTER — Telehealth: Payer: Self-pay | Admitting: Internal Medicine

## 2015-08-03 NOTE — Telephone Encounter (Signed)
Patient Name: Rachel Huffman DOB: 01/30/1961 Initial Comment Caller states, was seen a week ago, dx sinus infection, taking cough meds and antibiotics , she still feels very tired , cough still mostly at night Nurse Assessment Nurse: Roma KayserForsythe, RN, Santina Evansatherine Date/Time (Eastern Time): 08/03/2015 11:25:18 AM Confirm and document reason for call. If symptomatic, describe symptoms. ---caller states she is on amox clauv for 9 days for sinus infection and codeine cough syrup. states she is almost out of both and is still feeling tired and drained. no fever and the sinus pain/pressure has definitely improved. just wondering how long it will take for her to feel better and what to do when the cough medication runs out if she still needs it? Has the patient traveled out of the country within the last 30 days? ---Not Applicable Does the patient have any new or worsening symptoms? ---Yes Will a triage be completed? ---Yes Related visit to physician within the last 2 weeks? ---Yes Does the PT have any chronic conditions? (i.e. diabetes, asthma, etc.) ---No Did the patient indicate they were pregnant? ---No Is this a behavioral health call? ---No Guidelines Guideline Title Affirmed Question Affirmed Notes Sinus Infection on Antibiotic Follow-up Call [1] Reasonable improvement on antibiotic AND [2] no fever or pain (all triage questions negative) Final Disposition User Home Care PulciferForsythe, RN, Santina EvansCatherine Comments advised caller to finish her meds. if after she has ran out of rx and otc are not helping call office back. verbalized understanding Disagree/Comply: Comply

## 2015-08-03 NOTE — Telephone Encounter (Signed)
FYI

## 2015-08-07 ENCOUNTER — Telehealth: Payer: Self-pay | Admitting: Internal Medicine

## 2015-08-07 MED ORDER — HYDROCODONE-HOMATROPINE 5-1.5 MG/5ML PO SYRP: 5.0000 mL | ORAL_SOLUTION | ORAL | Status: DC | PRN

## 2015-08-07 NOTE — Telephone Encounter (Signed)
Patient would like the Hydrocodone Syrup refilled.

## 2015-08-07 NOTE — Telephone Encounter (Signed)
k to refill x 1

## 2015-08-07 NOTE — Telephone Encounter (Signed)
Left a message for a return call.

## 2015-08-07 NOTE — Telephone Encounter (Signed)
Patient returned your telephone call.   °

## 2015-08-07 NOTE — Telephone Encounter (Signed)
Spoke to the pt.  All sx are better except for the cough (not worse).  Pt is not sleeping at night.  No fever.

## 2015-08-08 NOTE — Telephone Encounter (Signed)
Left a message on identified # for the pt to pick up at the front desk.

## 2015-08-14 ENCOUNTER — Ambulatory Visit (INDEPENDENT_AMBULATORY_CARE_PROVIDER_SITE_OTHER): Payer: 59 | Admitting: Internal Medicine

## 2015-08-14 ENCOUNTER — Encounter: Payer: Self-pay | Admitting: Internal Medicine

## 2015-08-14 VITALS — BP 126/76 | Temp 97.6°F | Wt 195.5 lb

## 2015-08-14 DIAGNOSIS — J0191 Acute recurrent sinusitis, unspecified: Secondary | ICD-10-CM | POA: Diagnosis not present

## 2015-08-14 DIAGNOSIS — J029 Acute pharyngitis, unspecified: Secondary | ICD-10-CM

## 2015-08-14 LAB — CBC WITH DIFFERENTIAL/PLATELET
Basophils Absolute: 0 10*3/uL (ref 0.0–0.1)
Basophils Relative: 0.6 % (ref 0.0–3.0)
EOS ABS: 0.2 10*3/uL (ref 0.0–0.7)
Eosinophils Relative: 2.2 % (ref 0.0–5.0)
HCT: 42.8 % (ref 36.0–46.0)
Hemoglobin: 14.2 g/dL (ref 12.0–15.0)
LYMPHS PCT: 19.9 % (ref 12.0–46.0)
Lymphs Abs: 1.6 10*3/uL (ref 0.7–4.0)
MCHC: 33.3 g/dL (ref 30.0–36.0)
MCV: 89.5 fl (ref 78.0–100.0)
MONO ABS: 0.7 10*3/uL (ref 0.1–1.0)
Monocytes Relative: 8.8 % (ref 3.0–12.0)
Neutro Abs: 5.6 10*3/uL (ref 1.4–7.7)
Neutrophils Relative %: 68.5 % (ref 43.0–77.0)
Platelets: 310 10*3/uL (ref 150.0–400.0)
RBC: 4.77 Mil/uL (ref 3.87–5.11)
RDW: 12.9 % (ref 11.5–15.5)
WBC: 8.2 10*3/uL (ref 4.0–10.5)

## 2015-08-14 MED ORDER — LEVOFLOXACIN 750 MG PO TABS: 750.0000 mg | ORAL_TABLET | Freq: Every day | ORAL | Status: DC

## 2015-08-14 NOTE — Patient Instructions (Signed)
Lab and throat culture today  Broader sp[ectrum antibiotic for  Presumed relapsing  Sinusitis and now sore throat .  Expect improvement the next 3 days  If not  Then consider we refer to ENT to evaluation

## 2015-08-14 NOTE — Progress Notes (Signed)
Pre visit review using our clinic review tool, if applicable. No additional management support is needed unless otherwise documented below in the visit note.  Chief Complaint  Patient presents with  . Cough    Sore throat is new.  Started over the weekend.  Does not feel that her sisus infection has went away.  . Sore Throat  . Nasal Congestion  . Headache    HPI: Patient Rachel Huffman  comes in today for SDA for   problem evaluation. rx nov for sinusitis with local measures and augmentin   Had improved but never all better  Now new sx over last daus  St cant breath  In nose is new  .    Hard to talk    Swollen and hurts to swallow.  Tired . No fever  Got about  50 % better from last illness.   Dec appetite . No no v d  At end of  Antibiotic energy never better and no face pain and  uncertain  If this is new illness or relapse old  Daughter dx with bronchitis  ROS: See pertinent positives and negatives per HPI.no hemptysis  Rash new wheeze  ? Had mono when younger    Past Medical History  Diagnosis Date  . Allergy   . Asthma   . Ulcerative (chronic) proctitis (HCC)     Family History  Problem Relation Age of Onset  . Hyperlipidemia Mother     HT  . Heart disease Father     stents died of cva     Social History   Social History  . Marital Status: Married    Spouse Name: N/A  . Number of Children: N/A  . Years of Education: N/A   Social History Main Topics  . Smoking status: Never Smoker   . Smokeless tobacco: Never Used  . Alcohol Use: Yes  . Drug Use: No  . Sexual Activity: Not on file   Other Topics Concern  . Not on file   Social History Narrative   orig fr [place]   hh of 2    1 pet dog.   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  Soon       g2p2    Outpatient Prescriptions Prior to Visit  Medication Sig Dispense Refill  . albuterol (PROAIR HFA) 108 (90 BASE) MCG/ACT inhaler INHALE 2  PUFFS INTO THE LUNGS EVERY 6 HOURS AS NEEDED FOR WHEEZING 17 g 2  . cyclobenzaprine (FLEXERIL) 5 MG tablet Take 1 tablet (5 mg total) by mouth 3 (three) times daily as needed for muscle spasms. 30 tablet 1  . HYDROcodone-homatropine (HYCODAN) 5-1.5 MG/5ML syrup Take 5 mLs by mouth every 4 (four) hours as needed for cough. 180 mL 0  . traMADol (ULTRAM) 50 MG tablet Take 1 tablet (50 mg total) by mouth at bedtime as needed (for restless legs). 90 tablet 3  . hyoscyamine (LEVSIN SL) 0.125 MG SL tablet PLACE 1 TABLET UNDER THE TONGUE EVERY 4 HOURS AS NEEDED FOR CRAMPING (Patient not taking: Reported on 10/18/2014) 30 tablet 2  . amoxicillin-clavulanate (AUGMENTIN) 875-125 MG tablet Take 1 tablet by mouth every 12 (twelve) hours. 14 tablet 0   No facility-administered medications prior to visit.     EXAM:  BP 126/76 mmHg  Temp(Src) 97.6 F (36.4 C) (Oral)  Wt 195 lb 8 oz (88.678 kg)  Body mass  index is 31.31 kg/(m^2). WDWN in NAD  quiet respirations; congested  somewhat hoarse. Non toxic . Not feeling well  No stridor  HEENT: Normocephalic ;atraumatic , Eyes;  PERRL, EOMs  Full, lids and conjunctiva clear,,Ears: no deformities, canals nl, TM landmarks normal, Nose: no deformity or discharge but quite congested;face bilaterall max tender ttp   mouthh : OP clear without lesion or edema .2+_ red no exudate nl airway Neck: Supple without adenopathy or masses tender ac nodes  Chest:  Clear to A without wheezes rales or rhonchi CV:  S1-S2 no gallops or murmurs peripheral perfusion is normal Skin :nl perfusion and no acute rashes    ASSESSMENT AND PLAN:  Discussed the following assessment and plan:  Acute pharyngitis, unspecified etiology - Plan: CBC with Differential/Platelet, Throat culture ([facility])  Acute recurrent sinusitis, unspecified location - improved on augmentin but not resolved and now acutely ill ?elapsing vs new illness  lab and levaquin today ent if not better - Plan: CBC with  Differential/Platelet, Throat culture ([facility])  cbc diff strep cx and empirirc broad spectrum antibiotic    consider ent eval or in office if not improved in 3+ days or as needed  Could also consider steroids because hx of allerg sx  -Patient advised to return or notify health care team  if symptoms worsen ,persist or new concerns arise.  Patient Instructions  Lab and throat culture today  Broader sp[ectrum antibiotic for  Presumed relapsing  Sinusitis and now sore throat .  Expect improvement the next 3 days  If not  Then consider we refer to ENT to evaluation    Burna MortimerWanda K. Panosh M.D.

## 2015-08-16 LAB — CULTURE, GROUP A STREP: Organism ID, Bacteria: NORMAL

## 2015-08-21 ENCOUNTER — Telehealth: Payer: Self-pay | Admitting: Internal Medicine

## 2015-08-21 ENCOUNTER — Other Ambulatory Visit: Payer: Self-pay | Admitting: Internal Medicine

## 2015-08-21 MED ORDER — TRAMADOL HCL 50 MG PO TABS: 50.0000 mg | ORAL_TABLET | Freq: Every evening | ORAL | Status: DC | PRN

## 2015-08-21 NOTE — Telephone Encounter (Signed)
Was filled for 1 year on 02/15/15.  Will call in the other 6 months. Called to the pharmacy and left on voicemail.

## 2015-08-21 NOTE — Telephone Encounter (Signed)
Pt request refill of the following:    traMADol (ULTRAM) 50 MG tablet    Pt said the pharmacy told her the rx cann ot be written fo a year only 6 months.   [facility]

## 2015-08-21 NOTE — Telephone Encounter (Signed)
Denied.  Filled for 1 year in June 2016. 

## 2015-08-21 NOTE — Telephone Encounter (Signed)
Spoke to the pt.  Advised that her prescription was filled for 1 year on 02/15/15.  Instructed her to call the pharmacy and call back if needed.

## 2015-08-21 NOTE — Telephone Encounter (Signed)
[facility] [address] [facility] [address] before the 28th of December please.

## 2015-09-08 ENCOUNTER — Other Ambulatory Visit: Payer: Self-pay

## 2015-09-08 DIAGNOSIS — Z1231 Encounter for screening mammogram for malignant neoplasm of breast: Secondary | ICD-10-CM

## 2015-09-25 ENCOUNTER — Ambulatory Visit
Admission: RE | Admit: 2015-09-25 | Discharge: 2015-09-25 | Disposition: A | Discharge status: Home / Self Care | Payer: 59 | Source: Ambulatory Visit

## 2015-09-25 DIAGNOSIS — Z1231 Encounter for screening mammogram for malignant neoplasm of breast: Secondary | ICD-10-CM

## 2016-02-12 ENCOUNTER — Ambulatory Visit: Payer: 59 | Admitting: Family Medicine

## 2016-02-12 NOTE — Progress Notes (Signed)
Pre visit review using our clinic review tool, if applicable. No additional management support is needed unless otherwise documented below in the visit note.  Chief Complaint  Patient presents with  . Sinus Pressure/Pain    X 1 wk and 2 days  . Cough  . Headache  . Sore Throat  . Ear Pain    HPI: Rachel Huffman 55 y.o. comes in for an acute SDA appointment today. Onset  Like a cold   And  Then  Pain  Is worse the last 3 days .   Then bad coughing  And green phelgm   Face Hurts    Like a mask for the last 3 days  Without relief using saline  No decongestants.    Self rx ibuprofen  .   No fever currently but feels badly. Fever at onset of illness. May have a little bit of wheezing feels that her phlegm and discharge is more than usual.   ROS: See pertinent positives and negatives per HPI.no0 hemoptysis   Past Medical History  Diagnosis Date  . Allergy   . Asthma   . Ulcerative (chronic) proctitis (HCC)     Family History  Problem Relation Age of Onset  . Hyperlipidemia Mother     HT  . Heart disease Father     stents died of cva     Social History   Social History  . Marital Status: Married    Spouse Name: N/A  . Number of Children: N/A  . Years of Education: N/A   Social History Main Topics  . Smoking status: Never Smoker   . Smokeless tobacco: Never Used  . Alcohol Use: Yes  . Drug Use: No  . Sexual Activity: Not Asked   Other Topics Concern  . None   Social History Narrative   orig fr [place]   hh of 2    1 pet dog.   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  Soon       g2p2    Outpatient Prescriptions Prior to Visit  Medication Sig Dispense Refill  . albuterol (PROAIR HFA) 108 (90 BASE) MCG/ACT inhaler INHALE 2 PUFFS INTO THE LUNGS EVERY 6 HOURS AS NEEDED FOR WHEEZING 17 g 2  . cyclobenzaprine (FLEXERIL) 5 MG tablet Take 1 tablet (5 mg total) by mouth 3 (three) times daily as needed for  muscle spasms. 30 tablet 1  . hyoscyamine (LEVSIN SL) 0.125 MG SL tablet PLACE 1 TABLET UNDER THE TONGUE EVERY 4 HOURS AS NEEDED FOR CRAMPING 30 tablet 2  . traMADol (ULTRAM) 50 MG tablet Take 1 tablet (50 mg total) by mouth at bedtime as needed (for restless legs). 90 tablet 1  . HYDROcodone-homatropine (HYCODAN) 5-1.5 MG/5ML syrup Take 5 mLs by mouth every 4 (four) hours as needed for cough. (Patient not taking: Reported on 02/13/2016) 180 mL 0  . levofloxacin (LEVAQUIN) 750 MG tablet Take 1 tablet (750 mg total) by mouth daily. 7 tablet 0   No facility-administered medications prior to visit.     EXAM:  BP 126/84 mmHg  Pulse 115  Temp(Src) 98.9 F (37.2 C) (Oral)  Wt 195 lb (88.451 kg)  SpO2 97%  Body mass index is 31.23 kg/(m^2). WDWN in NAD  quiet respirations; mildly congested  somewhat hoarse. Non toxic . Mildly ill  HEENT: Normocephalic ;atraumatic , Eyes;  PERRL, EOMs  Full, lids and conjunctiva  clear,,Ears: no deformities, canals nl, TM landmarks normal, Nose: no deformity or discharge but congested;face tender bilateral cheeks and frontal  tender Mouth : OP clear without lesion or edema . Neck: Supple without adenopathy or masses or bruits Chest:  Clear to A&P without wheezes rales or rhonchi but  ? Air movement  CV:  S1-S2 no gallops or murmurs peripheral perfusion is normal Skin :nl perfusion and no acute rashes  ASSESSMENT AND PLAN:  Discussed the following assessment and plan:  Acute recurrent sinusitis, unspecified location  Chronic rhinitis  Risk benefit of medication discussed. complicated uri  -Patient advised to return or notify health care team  if symptoms worsen ,persist or new concerns arise.  Patient Instructions  Warm compresses  Decongestants    Add antibiotic  Cough med for comfort  Sinusitis, Adult Sinusitis is redness, soreness, and inflammation of the paranasal sinuses. Paranasal sinuses are air pockets within the bones of your face. They are  located beneath your eyes, in the middle of your forehead, and above your eyes. In healthy paranasal sinuses, mucus is able to drain out, and air is able to circulate through them by way of your nose. However, when your paranasal sinuses are inflamed, mucus and air can become trapped. This can allow bacteria and other germs to grow and cause infection. Sinusitis can develop quickly and last only a short time (acute) or continue over a long period (chronic). Sinusitis that lasts for more than 12 weeks is considered chronic. CAUSES Causes of sinusitis include:  Allergies.  Structural abnormalities, such as displacement of the cartilage that separates your nostrils (deviated septum), which can decrease the air flow through your nose and sinuses and affect sinus drainage.  Functional abnormalities, such as when the small hairs (cilia) that line your sinuses and help remove mucus do not work properly or are not present. SIGNS AND SYMPTOMS Symptoms of acute and chronic sinusitis are the same. The primary symptoms are pain and pressure around the affected sinuses. Other symptoms include:  Upper toothache.  Earache.  Headache.  Bad breath.  Decreased sense of smell and taste.  A cough, which worsens when you are lying flat.  Fatigue.  Fever.  Thick drainage from your nose, which often is green and may contain pus (purulent).  Swelling and warmth over the affected sinuses. DIAGNOSIS Your health care provider will perform a physical exam. During your exam, your health care provider may perform any of the following to help determine if you have acute sinusitis or chronic sinusitis:  Look in your nose for signs of abnormal growths in your nostrils (nasal polyps).  Tap over the affected sinus to check for signs of infection.  View the inside of your sinuses using an imaging device that has a light attached (endoscope). If your health care provider suspects that you have chronic sinusitis,  one or more of the following tests may be recommended:  Allergy tests.  Nasal culture. A sample of mucus is taken from your nose, sent to a lab, and screened for bacteria.  Nasal cytology. A sample of mucus is taken from your nose and examined by your health care provider to determine if your sinusitis is related to an allergy. TREATMENT Most cases of acute sinusitis are related to a viral infection and will resolve on their own within 10 days. Sometimes, medicines are prescribed to help relieve symptoms of both acute and chronic sinusitis. These may include pain medicines, decongestants, nasal steroid sprays, or saline sprays. However, for  sinusitis related to a bacterial infection, your health care provider will prescribe antibiotic medicines. These are medicines that will help kill the bacteria causing the infection. Rarely, sinusitis is caused by a fungal infection. In these cases, your health care provider will prescribe antifungal medicine. For some cases of chronic sinusitis, surgery is needed. Generally, these are cases in which sinusitis recurs more than 3 times per year, despite other treatments. HOME CARE INSTRUCTIONS  Drink plenty of water. Water helps thin the mucus so your sinuses can drain more easily.  Use a humidifier.  Inhale steam 3-4 times a day (for example, sit in the bathroom with the shower running).  Apply a warm, moist washcloth to your face 3-4 times a day, or as directed by your health care provider.  Use saline nasal sprays to help moisten and clean your sinuses.  Take medicines only as directed by your health care provider.  If you were prescribed either an antibiotic or antifungal medicine, finish it all even if you start to feel better. SEEK IMMEDIATE MEDICAL CARE IF:  You have increasing pain or severe headaches.  You have nausea, vomiting, or drowsiness.  You have swelling around your face.  You have vision problems.  You have a stiff  neck.  You have difficulty breathing.   This information is not intended to replace advice given to you by your health care provider. Make sure you discuss any questions you have with your health care provider.   Document Released: 08/19/2005 Document Revised: 09/09/2014 Document Reviewed: 09/03/2011 Elsevier Interactive Patient Education 2016 [employer].      [place]. Bart Ashford M.D.

## 2016-02-13 ENCOUNTER — Encounter: Payer: Self-pay | Admitting: Internal Medicine

## 2016-02-13 ENCOUNTER — Ambulatory Visit (INDEPENDENT_AMBULATORY_CARE_PROVIDER_SITE_OTHER): Payer: 59 | Admitting: Internal Medicine

## 2016-02-13 VITALS — BP 126/84 | HR 115 | Temp 98.9°F | Wt 195.0 lb

## 2016-02-13 DIAGNOSIS — J0191 Acute recurrent sinusitis, unspecified: Secondary | ICD-10-CM | POA: Diagnosis not present

## 2016-02-13 DIAGNOSIS — J310 Chronic rhinitis: Secondary | ICD-10-CM | POA: Diagnosis not present

## 2016-02-13 MED ORDER — HYDROCODONE-HOMATROPINE 5-1.5 MG/5ML PO SYRP: 5.0000 mL | ORAL_SOLUTION | ORAL | Status: DC | PRN

## 2016-02-13 MED ORDER — AMOXICILLIN-POT CLAVULANATE 875-125 MG PO TABS: 1.0000 | ORAL_TABLET | Freq: Two times a day (BID) | ORAL | Status: DC

## 2016-02-13 NOTE — Patient Instructions (Signed)
Warm compresses  Decongestants    Add antibiotic  Cough med for comfort  Sinusitis, Adult Sinusitis is redness, soreness, and inflammation of the paranasal sinuses. Paranasal sinuses are air pockets within the bones of your face. They are located beneath your eyes, in the middle of your forehead, and above your eyes. In healthy paranasal sinuses, mucus is able to drain out, and air is able to circulate through them by way of your nose. However, when your paranasal sinuses are inflamed, mucus and air can become trapped. This can allow bacteria and other germs to grow and cause infection. Sinusitis can develop quickly and last only a short time (acute) or continue over a long period (chronic). Sinusitis that lasts for more than 12 weeks is considered chronic. CAUSES Causes of sinusitis include:  Allergies.  Structural abnormalities, such as displacement of the cartilage that separates your nostrils (deviated septum), which can decrease the air flow through your nose and sinuses and affect sinus drainage.  Functional abnormalities, such as when the small hairs (cilia) that line your sinuses and help remove mucus do not work properly or are not present. SIGNS AND SYMPTOMS Symptoms of acute and chronic sinusitis are the same. The primary symptoms are pain and pressure around the affected sinuses. Other symptoms include:  Upper toothache.  Earache.  Headache.  Bad breath.  Decreased sense of smell and taste.  A cough, which worsens when you are lying flat.  Fatigue.  Fever.  Thick drainage from your nose, which often is green and may contain pus (purulent).  Swelling and warmth over the affected sinuses. DIAGNOSIS Your health care provider will perform a physical exam. During your exam, your health care provider may perform any of the following to help determine if you have acute sinusitis or chronic sinusitis:  Look in your nose for signs of abnormal growths in your nostrils (nasal  polyps).  Tap over the affected sinus to check for signs of infection.  View the inside of your sinuses using an imaging device that has a light attached (endoscope). If your health care provider suspects that you have chronic sinusitis, one or more of the following tests may be recommended:  Allergy tests.  Nasal culture. A sample of mucus is taken from your nose, sent to a lab, and screened for bacteria.  Nasal cytology. A sample of mucus is taken from your nose and examined by your health care provider to determine if your sinusitis is related to an allergy. TREATMENT Most cases of acute sinusitis are related to a viral infection and will resolve on their own within 10 days. Sometimes, medicines are prescribed to help relieve symptoms of both acute and chronic sinusitis. These may include pain medicines, decongestants, nasal steroid sprays, or saline sprays. However, for sinusitis related to a bacterial infection, your health care provider will prescribe antibiotic medicines. These are medicines that will help kill the bacteria causing the infection. Rarely, sinusitis is caused by a fungal infection. In these cases, your health care provider will prescribe antifungal medicine. For some cases of chronic sinusitis, surgery is needed. Generally, these are cases in which sinusitis recurs more than 3 times per year, despite other treatments. HOME CARE INSTRUCTIONS  Drink plenty of water. Water helps thin the mucus so your sinuses can drain more easily.  Use a humidifier.  Inhale steam 3-4 times a day (for example, sit in the bathroom with the shower running).  Apply a warm, moist washcloth to your face 3-4 times a day,  or as directed by your health care provider.  Use saline nasal sprays to help moisten and clean your sinuses.  Take medicines only as directed by your health care provider.  If you were prescribed either an antibiotic or antifungal medicine, finish it all even if you start  to feel better. SEEK IMMEDIATE MEDICAL CARE IF:  You have increasing pain or severe headaches.  You have nausea, vomiting, or drowsiness.  You have swelling around your face.  You have vision problems.  You have a stiff neck.  You have difficulty breathing.   This information is not intended to replace advice given to you by your health care provider. Make sure you discuss any questions you have with your health care provider.   Document Released: 08/19/2005 Document Revised: 09/09/2014 Document Reviewed: 09/03/2011 Elsevier Interactive Patient Education [employer].

## 2016-02-14 ENCOUNTER — Telehealth: Payer: Self-pay | Admitting: Internal Medicine

## 2016-02-14 NOTE — Telephone Encounter (Signed)
Pt request refill  traMADol (ULTRAM) 50 MG tablet  [facility]   Pt states she forgot to ask dr Fabian Sharppanosh yesterday for this.

## 2016-02-14 NOTE — Telephone Encounter (Signed)
Ok to refill x 1  ( 90) dont take with the hydrocodone cough med    Last Pv med was 10 15 and  Med check was  6 16 Ov with tox screen due    In next 3 months   9 before runs out of meds)

## 2016-02-15 MED ORDER — TRAMADOL HCL 50 MG PO TABS: 50.0000 mg | ORAL_TABLET | Freq: Every evening | ORAL | Status: DC | PRN

## 2016-02-15 NOTE — Telephone Encounter (Signed)
Called to the pharmacy and left on machine.  Pt notified do not take with Hycodan cough syrup  She will come back in 3 months for ov.

## 2016-02-16 ENCOUNTER — Telehealth: Payer: Self-pay | Admitting: Internal Medicine

## 2016-02-16 MED ORDER — AMOXICILLIN 500 MG PO CAPS: 500.0000 mg | ORAL_CAPSULE | Freq: Three times a day (TID) | ORAL | Status: DC

## 2016-02-16 NOTE — Telephone Encounter (Signed)
Patient Name: Rachel BurkittLORI Huffman DOB: 09/20/1960 Initial Comment Caller needs to have ABX changed because it is upsetting her stomach. is on Amox/clav 875 mg for sinus infection. Nurse Assessment Nurse: Karlene LinemanKluth, RN, Bonita QuinLinda Date/Time (Eastern Time): 02/16/2016 8:09:41 AM Please select the assessment type ---Verbal order / New medication order Additional Documentation ---Caller states she needs to have ABX changed because it is upsetting her stomach. Is on Amox/clav 875 mg for sinus infection. Tried to take with food and break in half. Wants normal amoxicillin. Does the client directives allow for assistance with medications after hours? ---Yes Other current medications? ---No Medication allergies? ---No Pharmacy name and phone number. ---[facility] [phone] Does the client directive allow for RN to call in the medication order to the pharmacy? ---Yes Additional Documentation ---Receptionist will give request to Dr. Fabian SharpPanosh. Guidelines Guideline Title Affirmed Question Affirmed Notes Final Disposition User Clinical Call Kluth, RN, Bonita QuinLinda Comments Called patient back to confirm that request is being sent to Dr.Panosh and if not filled by noon to call office to inquire.

## 2016-02-16 NOTE — Telephone Encounter (Signed)
Pt was notified earlier today that rx will be sent in before 5 pm today.  Sent by e-scribe.

## 2016-02-16 NOTE — Telephone Encounter (Signed)
Call center called for pt to advise she cannot take the amoxicillin-clavulanate (AUGMENTIN) 875-125 MG tablet Prescribed to her on 6/13. Pt states it is making her sick on her stomach.  Pt tried to take half of the tab, but still making her sick to her stomach. Pt states she can tolerate amoxicillin. [facility] [address]

## 2016-02-16 NOTE — Telephone Encounter (Signed)
Can change to amoxicillin  500 mg 1 po tid for 7 days  disp 21

## 2016-07-03 HISTORY — PX: KNEE SURGERY: SHX244

## 2016-09-09 ENCOUNTER — Other Ambulatory Visit: Payer: Self-pay | Admitting: Internal Medicine

## 2016-09-09 NOTE — Telephone Encounter (Signed)
Ok to refill x 1   She has cpx in march

## 2016-09-10 NOTE — Progress Notes (Signed)
Pre visit review using our clinic review tool, if applicable. No additional management support is needed unless otherwise documented below in the visit note.  Chief Complaint  Patient presents with  . Sinus Pressure/Pain    X2wks  . Cough  . Headache  . Nasal Congestion  . Sneezing    HPI: Rachel Huffman 56 y.o.  sda  Onset about 2 weeks ago with a head cold type symptoms then over the last 3-4 days increasing facial pressure or tenderness in the cheekbones and frontal area. She's used Flonase intermittently didn't know what else to take. Has a history sinusitis. Asks about cough med also as she's been coughing more at night. No fever shortness of breath or wheezing.  ROS: See pertinent positives and negatives per HPI.  Past Medical History:  Diagnosis Date  . Allergy   . Asthma   . Ulcerative (chronic) proctitis (HCC)     Family History  Problem Relation Age of Onset  . Hyperlipidemia Mother     HT  . Heart disease Father     stents died of cva     Social History   Social History  . Marital status: Married    Spouse name: N/A  . Number of children: N/A  . Years of education: N/A   Social History Main Topics  . Smoking status: Never Smoker  . Smokeless tobacco: Never Used  . Alcohol use Yes  . Drug use: No  . Sexual activity: Not Asked   Other Topics Concern  . None   Social History Narrative   orig fr [place]   hh of 2    1 pet dog.   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  Soon       g2p2       EXAM:  BP 128/80 (BP Location: Left Arm, Patient Position: Sitting, Cuff Size: Large)   Temp 98.4 F (36.9 C) (Oral)   Wt 199 lb (90.3 kg)   BMI 31.88 kg/m   Body mass index is 31.88 kg/m.  WDWN in NAD  quiet respirations; mildly congested  somewhat hoarse. Non toxic . Obvious nasal congestion. HEENT: Normocephalic ;atraumatic , Eyes;  PERRL, EOMs  Full, lids and conjunctiva  clear,,Ears: no deformities, canals nl, TM landmarks normal, Nose: no deformity or discharge but congested;face cheekbones maxilla and bifrontal area moderately tender. Mouth : OP clear without lesion or edema . Neck: Supple without adenopathy or masses or bruits Chest:  Clear to A without wheezes rales or rhonchi CV:  S1-S2 no gallops or murmurs peripheral perfusion is normal Skin :nl perfusion and no acute rashes   ASSESSMENT AND PLAN:  Discussed the following assessment and plan:  Sinusitis, unspecified chronicity, unspecified location - max frontal wit hx of same . add decon saline antibiotic complicated uri  Acute recurrent sinusitis, unspecified location  Cough, persistent  Medication management Had some stomach side effects with Augmentin a while back. Omnicef saline Flonase and decongestant trial Okay to refill her tramadol until she comes in for her checkup in March. Caution with hydrocodone cough medicine and tramadol. -Patient advised to return or notify health care team  if symptoms worsen ,persist or new concerns arise.  Patient Instructions  Treatment for sinusitis includes nasal saline over-the-counter decongestants Flonase nose spray every day warm compresses and antibiotic. Expect improvement within 3-5 days if not sooner. Let us know if not improved.  Neta MendsWanda K. Maxum Cassarino M.D. Allergies as of 09/11/2016      Reactions   Clarithromycin    REACTION: diarrhea   Doxycycline Nausea Only      Medication List       Accurate as of 09/11/16  4:45 PM. Always use your most recent med list.          albuterol 108 (90 Base) MCG/ACT inhaler Commonly known as:  PROAIR HFA INHALE 2 PUFFS INTO THE LUNGS EVERY 6 HOURS AS NEEDED FOR WHEEZING   cefdinir 300 MG capsule Commonly known as:  OMNICEF Take 1 capsule (300 mg total) by mouth 2 (two) times daily.   cyclobenzaprine 5 MG tablet Commonly known as:  FLEXERIL Take 1 tablet (5 mg total) by mouth 3 (three) times  daily as needed for muscle spasms.   HYDROcodone-homatropine 5-1.5 MG/5ML syrup Commonly known as:  HYCODAN Take 5 mLs by mouth every 4 (four) hours as needed for cough. At night   traMADol 50 MG tablet Commonly known as:  ULTRAM TAKE 1 TABLET BY MOUTH AT BEDTIME AS NEEDED FOR RESTLESS LEGS

## 2016-09-11 ENCOUNTER — Encounter: Payer: Self-pay | Admitting: Internal Medicine

## 2016-09-11 ENCOUNTER — Ambulatory Visit (INDEPENDENT_AMBULATORY_CARE_PROVIDER_SITE_OTHER): Payer: 59 | Admitting: Internal Medicine

## 2016-09-11 VITALS — BP 128/80 | Temp 98.4°F | Wt 199.0 lb

## 2016-09-11 DIAGNOSIS — R053 Chronic cough: Secondary | ICD-10-CM

## 2016-09-11 DIAGNOSIS — Z79899 Other long term (current) drug therapy: Secondary | ICD-10-CM

## 2016-09-11 DIAGNOSIS — J329 Chronic sinusitis, unspecified: Secondary | ICD-10-CM | POA: Diagnosis not present

## 2016-09-11 DIAGNOSIS — J0191 Acute recurrent sinusitis, unspecified: Secondary | ICD-10-CM | POA: Diagnosis not present

## 2016-09-11 DIAGNOSIS — R05 Cough: Secondary | ICD-10-CM

## 2016-09-11 MED ORDER — CEFDINIR 300 MG PO CAPS: 300.0000 mg | ORAL_CAPSULE | Freq: Two times a day (BID) | ORAL | 0 refills | Status: DC

## 2016-09-11 MED ORDER — HYDROCODONE-HOMATROPINE 5-1.5 MG/5ML PO SYRP: 5.0000 mL | ORAL_SOLUTION | ORAL | 0 refills | Status: DC | PRN

## 2016-09-11 NOTE — Patient Instructions (Signed)
Treatment for sinusitis includes nasal saline over-the-counter decongestants Flonase nose spray every day warm compresses and antibiotic. Expect improvement within 3-5 days if not sooner. Let us know if not improved.

## 2016-09-11 NOTE — Telephone Encounter (Signed)
Called to the pharmacy and left on machine. 

## 2016-09-25 DIAGNOSIS — M1712 Unilateral primary osteoarthritis, left knee: Secondary | ICD-10-CM | POA: Diagnosis not present

## 2016-10-02 DIAGNOSIS — M1712 Unilateral primary osteoarthritis, left knee: Secondary | ICD-10-CM | POA: Diagnosis not present

## 2016-10-09 DIAGNOSIS — M1712 Unilateral primary osteoarthritis, left knee: Secondary | ICD-10-CM | POA: Diagnosis not present

## 2016-10-21 ENCOUNTER — Encounter: Payer: Self-pay | Admitting: Internal Medicine

## 2016-10-21 NOTE — Telephone Encounter (Signed)
Pt would like to know if Dr Fabian SharpPanosh will call in an abx for these new symptoms?  [phone] - [place] - [address] [facility] [address]  Pt had not heard back and was hoping to get new rx today.

## 2016-10-21 NOTE — Telephone Encounter (Signed)
She was rx a month ago  And DID the sx  Go away  In January   Or relapse  . Will send in med depending on response

## 2016-10-22 ENCOUNTER — Telehealth: Payer: Self-pay | Admitting: Internal Medicine

## 2016-10-22 MED ORDER — AMOXICILLIN 875 MG PO TABS: 875.0000 mg | ORAL_TABLET | Freq: Three times a day (TID) | ORAL | 0 refills | Status: DC

## 2016-10-22 NOTE — Telephone Encounter (Signed)
Please see my answer on my chart  Rachel Huffman help send in med

## 2016-10-22 NOTE — Telephone Encounter (Signed)
Ok to do amox 875 1 po tid for 7 days  Disp 21

## 2016-10-22 NOTE — Telephone Encounter (Signed)
° ° ° °  Pt call to follow up on a message she sent thru pt portal

## 2016-10-22 NOTE — Telephone Encounter (Signed)
Medication sent to the pharmacy.  Message sent by MyChart.

## 2016-10-31 ENCOUNTER — Other Ambulatory Visit (INDEPENDENT_AMBULATORY_CARE_PROVIDER_SITE_OTHER): Payer: 59

## 2016-10-31 DIAGNOSIS — Z0000 Encounter for general adult medical examination without abnormal findings: Secondary | ICD-10-CM | POA: Diagnosis not present

## 2016-10-31 LAB — CBC WITH DIFFERENTIAL/PLATELET
Basophils Absolute: 0.1 10*3/uL (ref 0.0–0.1)
Basophils Relative: 1.1 % (ref 0.0–3.0)
EOS PCT: 3.2 % (ref 0.0–5.0)
Eosinophils Absolute: 0.2 10*3/uL (ref 0.0–0.7)
HCT: 40.9 % (ref 36.0–46.0)
Hemoglobin: 13.9 g/dL (ref 12.0–15.0)
LYMPHS ABS: 1.7 10*3/uL (ref 0.7–4.0)
Lymphocytes Relative: 27.5 % (ref 12.0–46.0)
MCHC: 34 g/dL (ref 30.0–36.0)
MCV: 87.1 fl (ref 78.0–100.0)
MONO ABS: 0.5 10*3/uL (ref 0.1–1.0)
Monocytes Relative: 8 % (ref 3.0–12.0)
NEUTROS PCT: 60.2 % (ref 43.0–77.0)
Neutro Abs: 3.8 10*3/uL (ref 1.4–7.7)
Platelets: 304 10*3/uL (ref 150.0–400.0)
RBC: 4.7 Mil/uL (ref 3.87–5.11)
RDW: 13.2 % (ref 11.5–15.5)
WBC: 6.2 10*3/uL (ref 4.0–10.5)

## 2016-10-31 LAB — LIPID PANEL
CHOL/HDL RATIO: 3
Cholesterol: 220 mg/dL — ABNORMAL HIGH (ref 0–200)
HDL: 66.4 mg/dL (ref >39.00)
LDL CALC: 126 mg/dL — AB (ref 0–99)
NONHDL: 154.03
TRIGLYCERIDES: 141 mg/dL (ref 0.0–149.0)
VLDL: 28.2 mg/dL (ref 0.0–40.0)

## 2016-10-31 LAB — BASIC METABOLIC PANEL
BUN: 13 mg/dL (ref 6–23)
CHLORIDE: 105 meq/L (ref 96–112)
CO2: 28 meq/L (ref 19–32)
Calcium: 9.6 mg/dL (ref 8.4–10.5)
Creatinine, Ser: 0.88 mg/dL (ref 0.40–1.20)
GFR: 70.67 mL/min (ref >60.00)
GLUCOSE: 100 mg/dL — AB (ref 70–99)
POTASSIUM: 4.3 meq/L (ref 3.5–5.1)
Sodium: 140 mEq/L (ref 135–145)

## 2016-10-31 LAB — HEPATIC FUNCTION PANEL
ALBUMIN: 4.1 g/dL (ref 3.5–5.2)
ALT: 19 U/L (ref 0–35)
AST: 16 U/L (ref 0–37)
Alkaline Phosphatase: 94 U/L (ref 39–117)
BILIRUBIN TOTAL: 0.7 mg/dL (ref 0.2–1.2)
Bilirubin, Direct: 0.1 mg/dL (ref 0.0–0.3)
Total Protein: 6.5 g/dL (ref 6.0–8.3)

## 2016-10-31 LAB — TSH: TSH: 1.96 u[IU]/mL (ref 0.35–4.50)

## 2016-11-05 ENCOUNTER — Other Ambulatory Visit: Payer: 59

## 2016-11-08 NOTE — Progress Notes (Deleted)
No chief complaint on file.   HPI: Patient  Rachel Huffman  56 y.o. comes in today for Preventive Health Care visit   Health Maintenance  Topic Date Due  . Hepatitis C Screening  [birth year]  . HIV Screening  12/04/1975  . COLONOSCOPY  12/04/2010  . PAP SMEAR  09/15/2015  . INFLUENZA VACCINE  01/30/2017 (Originally 04/02/2016)  . MAMMOGRAM  09/24/2017  . TETANUS/TDAP  03/23/2024   Health Maintenance Review LIFESTYLE:  Exercise:   Tobacco/ETS: Alcohol:  Sugar beverages: Sleep: Drug use: no HH of  Work:    ROS:  GEN/ HEENT: No fever, significant weight changes sweats headaches vision problems hearing changes, CV/ PULM; No chest pain shortness of breath cough, syncope,edema  change in exercise tolerance. GI /GU: No adominal pain, vomiting, change in bowel habits. No blood in the stool. No significant GU symptoms. SKIN/HEME: ,no acute skin rashes suspicious lesions or bleeding. No lymphadenopathy, nodules, masses.  NEURO/ PSYCH:  No neurologic signs such as weakness numbness. No depression anxiety. IMM/ Allergy: No unusual infections.  Allergy .   REST of 12 system review negative except as per HPI   Past Medical History:  Diagnosis Date  . Allergy   . Asthma   . Ulcerative (chronic) proctitis [facility])     Past Surgical History:  Procedure Laterality Date  . NO PAST SURGERIES      Family History  Problem Relation Age of Onset  . Hyperlipidemia Mother     HT  . Heart disease Father     stents died of cva     Social History   Social History  . Marital status: Married    Spouse name: N/A  . Number of children: N/A  . Years of education: N/A   Social History Main Topics  . Smoking status: Never Smoker  . Smokeless tobacco: Never Used  . Alcohol use Yes  . Drug use: No  . Sexual activity: Not on file   Other Topics Concern  . Not on file   Social History Narrative   orig fr [place]   hh of 2    1 pet dog.   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  Soon       g2p2    Outpatient Medications Prior to Visit  Medication Sig Dispense Refill  . albuterol (PROAIR HFA) 108 (90 BASE) MCG/ACT inhaler INHALE 2 PUFFS INTO THE LUNGS EVERY 6 HOURS AS NEEDED FOR WHEEZING 17 g 2  . amoxicillin (AMOXIL) 875 MG tablet Take 1 tablet (875 mg total) by mouth 3 (three) times daily. 21 tablet 0  . cefdinir (OMNICEF) 300 MG capsule Take 1 capsule (300 mg total) by mouth 2 (two) times daily. 14 capsule 0  . cyclobenzaprine (FLEXERIL) 5 MG tablet Take 1 tablet (5 mg total) by mouth 3 (three) times daily as needed for muscle spasms. 30 tablet 1  . HYDROcodone-homatropine (HYCODAN) 5-1.5 MG/5ML syrup Take 5 mLs by mouth every 4 (four) hours as needed for cough. At night 120 mL 0  . traMADol (ULTRAM) 50 MG tablet TAKE 1 TABLET BY MOUTH AT BEDTIME AS NEEDED FOR RESTLESS LEGS 90 tablet 0   No facility-administered medications prior to visit.      EXAM:  There were no vitals taken for this visit.  There is no height or weight on file to calculate BMI. Wt Readings from Last 3  Encounters:  09/11/16 199 lb (90.3 kg)  02/13/16 195 lb (88.5 kg)  08/14/15 195 lb 8 oz (88.7 kg)    Physical Exam: Vital signs reviewed ZOX:WRUEGEN:This is a well-developed well-nourished alert cooperative    who appearsr stated age in no acute distress.  HEENT: normocephalic atraumatic , Eyes: PERRL EOM's full, conjunctiva clear, Nares: paten,t no deformity discharge or tenderness., Ears: no deformity EAC's clear TMs with normal landmarks. Mouth: clear OP, no lesions, edema.  Moist mucous membranes. Dentition in adequate repair. NECK: supple without masses, thyromegaly or bruits. CHEST/PULM:  Clear to auscultation and percussion breath sounds equal no wheeze , rales or rhonchi. No chest wall deformities or tenderness. Breast: normal by inspection . No dimpling, discharge, masses, tenderness or discharge . CV: PMI is nondisplaced,  S1 S2 no gallops, murmurs, rubs. Peripheral pulses are full without delay.No JVD .  ABDOMEN: Bowel sounds normal nontender  No guard or rebound, no hepato splenomegal no CVA tenderness.  No hernia. Extremtities:  No clubbing cyanosis or edema, no acute joint swelling or redness no focal atrophy NEURO:  Oriented x3, cranial nerves 3-12 appear to be intact, no obvious focal weakness,gait within normal limits no abnormal reflexes or asymmetrical SKIN: No acute rashes normal turgor, color, no bruising or petechiae. PSYCH: Oriented, good eye contact, no obvious depression anxiety, cognition and judgment appear normal. LN: no cervical axillary inguinal adenopathy  Lab Results  Component Value Date   WBC 6.2 10/31/2016   HGB 13.9 10/31/2016   HCT 40.9 10/31/2016   PLT 304.0 10/31/2016   GLUCOSE 100 (H) 10/31/2016   CHOL 220 (H) 10/31/2016   TRIG 141.0 10/31/2016   HDL 66.40 10/31/2016   LDLCALC 126 (H) 10/31/2016   ALT 19 10/31/2016   AST 16 10/31/2016   NA 140 10/31/2016   K 4.3 10/31/2016   CL 105 10/31/2016   CREATININE 0.88 10/31/2016   BUN 13 10/31/2016   CO2 28 10/31/2016   TSH 1.96 10/31/2016    BP Readings from Last 3 Encounters:  09/11/16 128/80  02/13/16 126/84  08/14/15 126/76    Lab results reviewed with patient   ASSESSMENT AND PLAN:  Discussed the following assessment and plan:  No diagnosis found.  Patient Care Team: Madelin HeadingsWanda K Panosh, MD as PCP - General (Internal Medicine) Philip AspenSidney Callahan, DO as Consulting Physician (Obstetrics and Gynecology) There are no Patient Instructions on file for this visit.  Neta MendsWanda K. Panosh M.D.

## 2016-11-11 ENCOUNTER — Encounter: Payer: 59 | Admitting: Internal Medicine

## 2016-11-14 ENCOUNTER — Other Ambulatory Visit: Payer: Self-pay | Admitting: Internal Medicine

## 2016-11-15 NOTE — Telephone Encounter (Signed)
We can do a refill  With do not fill unitl  April 5?   But also  I advise we move up her  CPX appt  earlier time  Should have enoughslots  spots to add on a cpx  !

## 2016-11-18 MED ORDER — TRAMADOL HCL 50 MG PO TABS: 50.0000 mg | ORAL_TABLET | Freq: Every day | ORAL | 0 refills | Status: DC | PRN

## 2016-12-24 ENCOUNTER — Ambulatory Visit (INDEPENDENT_AMBULATORY_CARE_PROVIDER_SITE_OTHER): Payer: 59 | Admitting: Internal Medicine

## 2016-12-24 ENCOUNTER — Encounter: Payer: Self-pay | Admitting: Internal Medicine

## 2016-12-24 VITALS — BP 118/80 | HR 107 | Temp 97.6°F | Ht 66.25 in | Wt 205.6 lb

## 2016-12-24 DIAGNOSIS — J309 Allergic rhinitis, unspecified: Secondary | ICD-10-CM | POA: Diagnosis not present

## 2016-12-24 DIAGNOSIS — R05 Cough: Secondary | ICD-10-CM | POA: Diagnosis not present

## 2016-12-24 DIAGNOSIS — R059 Cough, unspecified: Secondary | ICD-10-CM

## 2016-12-24 MED ORDER — MONTELUKAST SODIUM 10 MG PO TABS: 10.0000 mg | ORAL_TABLET | Freq: Every day | ORAL | 5 refills | Status: DC

## 2016-12-24 MED ORDER — PREDNISONE 20 MG PO TABS: ORAL_TABLET | ORAL | 0 refills | Status: DC

## 2016-12-24 MED ORDER — HYDROCODONE-HOMATROPINE 5-1.5 MG/5ML PO SYRP
5.0000 mL | ORAL_SOLUTION | Freq: Four times a day (QID) | ORAL | 0 refills | Status: DC | PRN
Start: 2016-12-24 — End: 2016-12-29

## 2016-12-24 NOTE — Progress Notes (Signed)
Chief Complaint  Patient presents with  . Cough    coughing phelgm /pt saids its allergy related   . Fatigue  . Headache  . Nasal Congestion    HPI: Rachel Huffman 56 y.o. sda with 2+    Weeks and seems like allergy .lots of head congestion and now cough (pnd)   Onset with nasal congestion.   and cough   When lots of outdoors exposures   Hs tried   flonase,    singulair  Left over from long ago only 3 days and claritin  and d not helpful.    Nose sprays ... Cloudy  not a lot of green.  itchy scratchy throat  And runny nose  And coughing   In am .   Very congested dosent feel like sinus infection with pain etc   . No fever  But hard to sleep bith congestion and cough and is tired  No wheezing.   Hx of pos allergy testing then updated negative but same sx as when came to [place] .    ROS: See pertinent positives and negatives per HPI. No fever chills sob   Past Medical History:  Diagnosis Date  . Allergy   . Asthma   . Ulcerative (chronic) proctitis (HCC)     Family History  Problem Relation Age of Onset  . Hyperlipidemia Mother     HT  . Heart disease Father     stents died of cva     Social History   Social History  . Marital status: Married    Spouse name: N/A  . Number of children: N/A  . Years of education: N/A   Social History Main Topics  . Smoking status: Never Smoker  . Smokeless tobacco: Never Used  . Alcohol use Yes  . Drug use: No  . Sexual activity: Not Asked   Other Topics Concern  . None   Social History Narrative   orig fr [place]   hh of 2    1 pet dog.   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  Soon       g2p2       EXAM:  BP 118/80 (BP Location: Left Arm, Patient Position: Sitting, Cuff Size: Normal)   Pulse (!) 107   Temp 97.6 F (36.4 C) (Oral)   Ht 5' 6.25" (1.683 m)   Wt 205 lb 9.6 oz (93.3 kg)   SpO2 97%   BMI 32.93 kg/m   Body mass index is 32.93  kg/m.  GENERAL: vitals reviewed and listed above, alert, oriented, appears well hydrated and in no acute distress  Very congestd lookds allergic  Runny nose and congested and ocass cough  HEENT: atraumatic, conjunctiva  clear, no obvious abnormalities on inspection of external nose and ears tm nl nares 2+ turbinates face NT OP : no lesion edema or exudate  Drainage tracts  NECK: no obvious masses on inspection palpation  LUNGS: clear to auscultation bilaterally, no wheezes, rales or rhonchi, good air movement CV: HRRR, no clubbing cyanosis or  peripheral edema nl cap refill  MS: moves all extremities without noticeable focal  abnormality PSYCH: pleasant and cooperative, no obvious depression or anxiety  ASSESSMENT AND PLAN:  Discussed the following assessment and plan:  Allergic sinusitis  Coughing - felt allergic drainage related  Asks for cough med at night to sleep options disc  pred burst cause of severity of sx and then controller meds disc . See below .  contactu [place] for fu if  persistent or progressive  -Patient advised to return or notify health care team  if symptoms worsen ,persist or new concerns arise.  Risk benefit of medication discussed.  Patient Instructions    Take prednisone for 3-5 days to decrease the inflammation in your sinuses. Stay on the Flonase 2 sprays each side of the nose can do this twice a day as you go off the prednisone in decrease to once a day when things are well. Again the Singulair this is a controller medicine. You can still add an antihistamine decongestant if helpful. When things are stable you can go off the Singulair but stay on the Flonase. If signs of infection that are concerning contact us Next season when you start getting any symptoms at all*your Flonase Singulair saline antihistamines early.Neta Mends. Naziah Portee M.D.

## 2016-12-24 NOTE — Patient Instructions (Addendum)
   Take prednisone for 3-5 days to decrease the inflammation in your sinuses. Stay on the Flonase 2 sprays each side of the nose can do this twice a day as you go off the prednisone in decrease to once a day when things are well. Again the Singulair this is a controller medicine. You can still add an antihistamine decongestant if helpful. When things are stable you can go off the Singulair but stay on the Flonase. If signs of infection that are concerning contact us Next season when you start getting any symptoms at all*your Flonase Singulair saline antihistamines early.Marland Kitchen

## 2016-12-29 ENCOUNTER — Other Ambulatory Visit: Payer: Self-pay | Admitting: Internal Medicine

## 2016-12-31 MED ORDER — HYDROCODONE-HOMATROPINE 5-1.5 MG/5ML PO SYRP: 5.0000 mL | ORAL_SOLUTION | Freq: Four times a day (QID) | ORAL | 0 refills | Status: DC | PRN

## 2016-12-31 NOTE — Telephone Encounter (Signed)
done

## 2016-12-31 NOTE — Telephone Encounter (Signed)
Pt is aware that rx is ready for pick up.  

## 2017-01-19 DIAGNOSIS — J069 Acute upper respiratory infection, unspecified: Secondary | ICD-10-CM | POA: Diagnosis not present

## 2017-02-06 ENCOUNTER — Telehealth: Payer: Self-pay | Admitting: Internal Medicine

## 2017-02-06 NOTE — Telephone Encounter (Signed)
Error

## 2017-02-07 DIAGNOSIS — J22 Unspecified acute lower respiratory infection: Secondary | ICD-10-CM | POA: Diagnosis not present

## 2017-02-07 DIAGNOSIS — R05 Cough: Secondary | ICD-10-CM | POA: Diagnosis not present

## 2017-02-14 NOTE — Progress Notes (Signed)
Chief Complaint  Patient presents with  . Annual Exam    HPI: Patient  Rachel Huffman  56 y.o. comes in today for Preventive Health Care visit  Pap and colon this year . Has other concern s   Back to back resp infections .  Went to [facility] rx with    Doxycycline  And shots ?  [facility]   ? If something on  X ray .minimal per UC .     No fever  . Every with this illness   Went off singulair  And   No hlep   Taking INCS only ocassional    Sx  coughing at night  And congested   And snoring.     States that she does have what sounds like heartburn symptoms when she lays down at night after coughing. She thinks it's related to the coughing. She took her husband's proton X and it helped as needed.  She knows she needs to lose some weight and is beginning to work on this.   Using  tramadol for rls and seems to work well for her    No other  Scheduled meds on  tbasis   Health Maintenance  Topic Date Due  . COLONOSCOPY  12/04/2010  . PAP SMEAR  09/15/2015  . Hepatitis C Screening  02/17/2018 (Originally 09-30-1960)  . HIV Screening  02/17/2018 (Originally 12/04/1975)  . INFLUENZA VACCINE  04/02/2017  . MAMMOGRAM  09/24/2017  . TETANUS/TDAP  03/23/2024   Health Maintenance Review LIFESTYLE:  Exercise:   gyme 3 x per week  walking am  Tobacco/ETS: no Alcohol:  ocass  Sugar beverages: no Sleep:  7ave .  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 GI /GU: No adominal pain, vomiting, change in bowel habits. No blood in the stool. No significant GU symptoms. SKIN/HEME: ,no acute skin rashes suspicious lesions or bleeding. No lymphadenopathy, nodules, masses.  NEURO/ PSYCH:  No neurologic signs such as weakness numbness.  No depression anxiety. IMM/ Allergy: No unusual infections.  Allergy .   REST of 12 system review negative except as per HPI   Past Medical History:  Diagnosis Date  . Allergy   . Asthma   . Ulcerative (chronic) proctitis [facility])     Past Surgical History:  Procedure Laterality Date  . KNEE SURGERY Left 07/2016  . NO PAST SURGERIES      Family History  Problem Relation Age of Onset  . Hyperlipidemia Mother        HT  . Heart disease Father        stents died of cva     Social History   Social History  . Marital status: Married    Spouse name: N/A  . Number of children: N/A  . Years of education: N/A   Social History Main Topics  . Smoking status: Never Smoker  . Smokeless tobacco: Never Used  . Alcohol use Yes     Comment: ocassionally  . Drug use: No  . Sexual activity: Not Asked   Other Topics Concern  . None   Social History Narrative   orig fr [place]   hh of 2  1 pet dog.   Work part time  [place] with husband   [place] children  to  be born .  Soon       g2p2       EXAM:  BP 120/80 (BP Location: Right Arm, Cuff Size: Large)   Pulse (!) 101   Temp 98.5 F (36.9 C) (Oral)   Ht '5\' 7"'  (1.702 m)   Wt 206 lb 9.6 oz (93.7 kg)   BMI 32.36 kg/m   Body mass index is 32.36 kg/m. Wt Readings from Last 3 Encounters:  02/17/17 206 lb 9.6 oz (93.7 kg)  12/24/16 205 lb 9.6 oz (93.3 kg)  09/11/16 199 lb (90.3 kg)    Physical Exam: Vital signs reviewed EZM:OQHU is a well-developed well-nourished alert cooperative    who appearsr stated age in no acute distress.  Mild congestion  Non toxic  No sig cough except with deep breathing  HEENT: normocephalic atraumatic , Eyes: PERRL EOM's full, conjunctiva clear, Nares: paten,t no deformity discharge or tenderness.congested , Ears: no deformity EAC's clear TMs with normal landmarks. Mouth: clear OP, no lesions, edema.  Moist mucous membranes. Dentition in adequate repair. NECK:  supple without masses, thyromegaly or bruits. CHEST/PULM:  Clear to auscultation and percussion breath sounds equal no wheeze , rales or rhonchi. No chest wall deformities or tenderness. Breast: normal by inspection . No dimpling, discharge, masses, tenderness or discharge . CV: PMI is nondisplaced, S1 S2 no gallops, murmurs, rubs. Peripheral pulses are full without delay.No JVD .  ABDOMEN: Bowel sounds normal nontender  No guard or rebound, no hepato splenomegal no CVA tenderness.  No hernia. Extremtities:  No clubbing cyanosis or edema, no acute joint swelling or redness no focal atrophy NEURO:  Oriented x3, cranial nerves 3-12 appear to be intact, no obvious focal weakness,gait within normal limits no abnormal reflexes or asymmetrical SKIN: No acute rashes normal turgor, color, no bruising or petechiae. PSYCH: Oriented, good eye contact, no obvious depression anxiety, cognition and judgment appear normal. LN: no cervical axillary inguinal adenopathy  Lab Results  Component Value Date   WBC 6.2 10/31/2016   HGB 13.9 10/31/2016   HCT 40.9 10/31/2016   PLT 304.0 10/31/2016   GLUCOSE 100 (H) 10/31/2016   CHOL 220 (H) 10/31/2016   TRIG 141.0 10/31/2016   HDL 66.40 10/31/2016   LDLCALC 126 (H) 10/31/2016   ALT 19 10/31/2016   AST 16 10/31/2016   NA 140 10/31/2016   K 4.3 10/31/2016   CL 105 10/31/2016   CREATININE 0.88 10/31/2016   BUN 13 10/31/2016   CO2 28 10/31/2016   TSH 1.96 10/31/2016    BP Readings from Last 3 Encounters:  02/17/17 120/80  12/24/16 118/80  09/11/16 128/80    Lab results reviewed with patient   ASSESSMENT AND PLAN:  Discussed the following assessment and plan:  Visit for preventive health examination  Medication management  Hyperlipidemia, unspecified hyperlipidemia type  Cough, persistent  Restless legs syndrome (RLS) - has been on tramadol for a while and helps  benefit more than risk at this time  Chronic rhinitis  Heartburn - nocturnal  supine  after coughing reported  I suspect her cough is a combination of postinfectious irritability as in the past possibly allergic possibly some mild chronic sinusitis usually not responsive to antibiotic and even possible GERD or esophageal reflux associated with her weight gain. We'll take some measures first before getting a pulmonary consult. She has a past  history of what sounds like an asthmatic tendency. Use the nasal cortisone's daily and twice a day chlorpheniramine for postnasal drainage cough and over-the-counter ranitidine 150 mg for possible esophageal reflux contributing. She will work on healthy weight loss as that may help also. Plan rov in 1 month or e mail   Patient Care Team: Allisha Harter, Standley Brooking, MD as PCP - General (Internal Medicine) Allyn Kenner, DO as Consulting Physician (Obstetrics and Gynecology) Patient Instructions  Intensify lifestyle interventions. Mediterranean eating  And avoiding simple sugars and carbs.   Get enough sleep .  In regard to your chronic coughing and postnasal drainage and possibly nocturnal reflux Begin nasal cortisone of your choice use twice a day for 2 weeks and then you can decrease to once a day but stay on it daily. Begin acid blocker at night 150 mg of ranitidine every night. Avoid eating late at night also. Can try chlorpheniramine or Chlor-Trimeton over-the-counter antihistamine at night which may help coughing fits from postnasal drainage and irritable airway. You can remain on the tramadol at night for your restless leg. Of interest sometimes tramadol has been used for cough recurrence by the pulmonology team. Do not use cough drops at this point that have medication men fall [place].  Plan rov in 1 months about how doing  Or send in e mail .  Plan folow up at that time about the cough .    Preventive Care 40-64 Years, Female Preventive care refers to lifestyle choices and visits with your health care provider that can promote  health and wellness. What does preventive care include?  A yearly physical exam. This is also called an annual well check.  Dental exams once or twice a year.  Routine eye exams. Ask your health care provider how often you should have your eyes checked.  Personal lifestyle choices, including: ? Daily care of your teeth and gums. ? Regular physical activity. ? Eating a healthy diet. ? Avoiding tobacco and drug use. ? Limiting alcohol use. ? Practicing safe sex. ? Taking low-dose aspirin daily starting at age 28. ? Taking vitamin and mineral supplements as recommended by your health care provider. What happens during an annual well check? The services and screenings done by your health care provider during your annual well check will depend on your age, overall health, lifestyle risk factors, and family history of disease. Counseling Your health care provider may ask you questions about your:  Alcohol use.  Tobacco use.  Drug use.  Emotional well-being.  Home and relationship well-being.  Sexual activity.  Eating habits.  Work and work Statistician.  Method of birth control.  Menstrual cycle.  Pregnancy history.  Screening You may have the following tests or measurements:  Height, weight, and BMI.  Blood pressure.  Lipid and cholesterol levels. These may be checked every 5 years, or more frequently if you are over 90 years old.  Skin check.  Lung cancer screening. You may have this screening every year starting at age 8 if you have a 30-pack-year history of smoking and currently smoke or have quit within the past 15 years.  Fecal occult blood test (FOBT) of the stool. You may have this test every year starting at age 56.  Flexible sigmoidoscopy or colonoscopy. You may have a sigmoidoscopy every 5 years or a colonoscopy every 10 years starting at age 41.  Hepatitis C blood test.  Hepatitis B blood test.  Sexually transmitted disease (STD)  testing.  Diabetes screening. This is  done by checking your blood sugar (glucose) after you have not eaten for a while (fasting). You may have this done every 1-3 years.  Mammogram. This may be done every 1-2 years. Talk to your health care provider about when you should start having regular mammograms. This may depend on whether you have a family history of breast cancer.  BRCA-related cancer screening. This may be done if you have a family history of breast, ovarian, tubal, or peritoneal cancers.  Pelvic exam and Pap test. This may be done every 3 years starting at age 40. Starting at age 13, this may be done every 5 years if you have a Pap test in combination with an HPV test.  Bone density scan. This is done to screen for osteoporosis. You may have this scan if you are at high risk for osteoporosis.  Discuss your test results, treatment options, and if necessary, the need for more tests with your health care provider. Vaccines Your health care provider may recommend certain vaccines, such as:  Influenza vaccine. This is recommended every year.  Tetanus, diphtheria, and acellular pertussis (Tdap, Td) vaccine. You may need a Td booster every 10 years.  Varicella vaccine. You may need this if you have not been vaccinated.  Zoster vaccine. You may need this after age 55.  Measles, mumps, and rubella (MMR) vaccine. You may need at least one dose of MMR if you were born in 1957 or later. You may also need a second dose.  Pneumococcal 13-valent conjugate (PCV13) vaccine. You may need this if you have certain conditions and were not previously vaccinated.  Pneumococcal polysaccharide (PPSV23) vaccine. You may need one or two doses if you smoke cigarettes or if you have certain conditions.  Meningococcal vaccine. You may need this if you have certain conditions.  Hepatitis A vaccine. You may need this if you have certain conditions or if you travel or work in places where you may be  exposed to hepatitis A.  Hepatitis B vaccine. You may need this if you have certain conditions or if you travel or work in places where you may be exposed to hepatitis B.  Haemophilus influenzae type b (Hib) vaccine. You may need this if you have certain conditions.  Talk to your health care provider about which screenings and vaccines you need and how often you need them. This information is not intended to replace advice given to you by your health care provider. Make sure you discuss any questions you have with your health care provider. Document Released: 09/15/2015 Document Revised: 05/08/2016 Document Reviewed: 06/20/2015 Elsevier Interactive Patient Education  2017 [place] [employer]. Tressa Maldonado M.D.

## 2017-02-17 ENCOUNTER — Ambulatory Visit (INDEPENDENT_AMBULATORY_CARE_PROVIDER_SITE_OTHER): Payer: 59 | Admitting: Internal Medicine

## 2017-02-17 ENCOUNTER — Encounter: Payer: Self-pay | Admitting: Internal Medicine

## 2017-02-17 VITALS — BP 120/80 | HR 101 | Temp 98.5°F | Ht 67.0 in | Wt 206.6 lb

## 2017-02-17 DIAGNOSIS — R05 Cough: Secondary | ICD-10-CM | POA: Diagnosis not present

## 2017-02-17 DIAGNOSIS — R12 Heartburn: Secondary | ICD-10-CM

## 2017-02-17 DIAGNOSIS — Z0000 Encounter for general adult medical examination without abnormal findings: Secondary | ICD-10-CM

## 2017-02-17 DIAGNOSIS — J310 Chronic rhinitis: Secondary | ICD-10-CM | POA: Diagnosis not present

## 2017-02-17 DIAGNOSIS — E785 Hyperlipidemia, unspecified: Secondary | ICD-10-CM | POA: Diagnosis not present

## 2017-02-17 DIAGNOSIS — Z79899 Other long term (current) drug therapy: Secondary | ICD-10-CM | POA: Diagnosis not present

## 2017-02-17 DIAGNOSIS — R053 Chronic cough: Secondary | ICD-10-CM

## 2017-02-17 DIAGNOSIS — G2581 Restless legs syndrome: Secondary | ICD-10-CM

## 2017-02-17 MED ORDER — TRAMADOL HCL 50 MG PO TABS: 50.0000 mg | ORAL_TABLET | Freq: Every day | ORAL | 1 refills | Status: DC

## 2017-02-17 NOTE — Assessment & Plan Note (Signed)
This time 2-3 months  After uri and rx UC x 2 with and tibiotica and  Shot poss steroid. Still with nocturnal cough and congestion and  Poss night HB sx .  See text  For plan

## 2017-02-17 NOTE — Patient Instructions (Signed)
Intensify lifestyle interventions. Mediterranean eating  And avoiding simple sugars and carbs.   Get enough sleep .  In regard to your chronic coughing and postnasal drainage and possibly nocturnal reflux Begin nasal cortisone of your choice use twice a day for 2 weeks and then you can decrease to once a day but stay on it daily. Begin acid blocker at night 150 mg of ranitidine every night. Avoid eating late at night also. Can try chlorpheniramine or Chlor-Trimeton over-the-counter antihistamine at night which may help coughing fits from postnasal drainage and irritable airway. You can remain on the tramadol at night for your restless leg. Of interest sometimes tramadol has been used for cough recurrence by the pulmonology team. Do not use cough drops at this point that have medication men fall [place].  Plan rov in 1 months about how doing  Or send in e mail .  Plan folow up at that time about the cough .    Preventive Care 40-64 Years, Female Preventive care refers to lifestyle choices and visits with your health care provider that can promote health and wellness. What does preventive care include?  A yearly physical exam. This is also called an annual well check.  Dental exams once or twice a year.  Routine eye exams. Ask your health care provider how often you should have your eyes checked.  Personal lifestyle choices, including: ? Daily care of your teeth and gums. ? Regular physical activity. ? Eating a healthy diet. ? Avoiding tobacco and drug use. ? Limiting alcohol use. ? Practicing safe sex. ? Taking low-dose aspirin daily starting at age 29. ? Taking vitamin and mineral supplements as recommended by your health care provider. What happens during an annual well check? The services and screenings done by your health care provider during your annual well check will depend on your age, overall health, lifestyle risk factors, and family history of disease. Counseling Your  health care provider may ask you questions about your:  Alcohol use.  Tobacco use.  Drug use.  Emotional well-being.  Home and relationship well-being.  Sexual activity.  Eating habits.  Work and work Statistician.  Method of birth control.  Menstrual cycle.  Pregnancy history.  Screening You may have the following tests or measurements:  Height, weight, and BMI.  Blood pressure.  Lipid and cholesterol levels. These may be checked every 5 years, or more frequently if you are over 2 years old.  Skin check.  Lung cancer screening. You may have this screening every year starting at age 5 if you have a 30-pack-year history of smoking and currently smoke or have quit within the past 15 years.  Fecal occult blood test (FOBT) of the stool. You may have this test every year starting at age 47.  Flexible sigmoidoscopy or colonoscopy. You may have a sigmoidoscopy every 5 years or a colonoscopy every 10 years starting at age 32.  Hepatitis C blood test.  Hepatitis B blood test.  Sexually transmitted disease (STD) testing.  Diabetes screening. This is done by checking your blood sugar (glucose) after you have not eaten for a while (fasting). You may have this done every 1-3 years.  Mammogram. This may be done every 1-2 years. Talk to your health care provider about when you should start having regular mammograms. This may depend on whether you have a family history of breast cancer.  BRCA-related cancer screening. This may be done if you have a family history of breast, ovarian, tubal, or peritoneal  cancers.  Pelvic exam and Pap test. This may be done every 3 years starting at age 67. Starting at age 60, this may be done every 5 years if you have a Pap test in combination with an HPV test.  Bone density scan. This is done to screen for osteoporosis. You may have this scan if you are at high risk for osteoporosis.  Discuss your test results, treatment options, and if  necessary, the need for more tests with your health care provider. Vaccines Your health care provider may recommend certain vaccines, such as:  Influenza vaccine. This is recommended every year.  Tetanus, diphtheria, and acellular pertussis (Tdap, Td) vaccine. You may need a Td booster every 10 years.  Varicella vaccine. You may need this if you have not been vaccinated.  Zoster vaccine. You may need this after age 23.  Measles, mumps, and rubella (MMR) vaccine. You may need at least one dose of MMR if you were born in 1957 or later. You may also need a second dose.  Pneumococcal 13-valent conjugate (PCV13) vaccine. You may need this if you have certain conditions and were not previously vaccinated.  Pneumococcal polysaccharide (PPSV23) vaccine. You may need one or two doses if you smoke cigarettes or if you have certain conditions.  Meningococcal vaccine. You may need this if you have certain conditions.  Hepatitis A vaccine. You may need this if you have certain conditions or if you travel or work in places where you may be exposed to hepatitis A.  Hepatitis B vaccine. You may need this if you have certain conditions or if you travel or work in places where you may be exposed to hepatitis B.  Haemophilus influenzae type b (Hib) vaccine. You may need this if you have certain conditions.  Talk to your health care provider about which screenings and vaccines you need and how often you need them. This information is not intended to replace advice given to you by your health care provider. Make sure you discuss any questions you have with your health care provider. Document Released: 09/15/2015 Document Revised: 05/08/2016 Document Reviewed: 06/20/2015 Elsevier Interactive Patient Education  2017 [employer].

## 2017-03-06 ENCOUNTER — Other Ambulatory Visit: Payer: Self-pay | Admitting: Family Medicine

## 2017-03-06 ENCOUNTER — Encounter: Payer: Self-pay | Admitting: Internal Medicine

## 2017-03-06 DIAGNOSIS — J329 Chronic sinusitis, unspecified: Secondary | ICD-10-CM

## 2017-03-06 NOTE — Telephone Encounter (Signed)
Ok to refill hycodan disp 120 cc    ent  Consult  [facility] ent we use commonly any provider for possible chronic  sinusitis

## 2017-03-06 NOTE — Telephone Encounter (Signed)
Please see my previous response ? Where it is in [facility]   Ok to refill 120 cc and [facility]. I

## 2017-03-07 MED ORDER — HYDROCODONE-HOMATROPINE 5-1.5 MG/5ML PO SYRP: 5.0000 mL | ORAL_SOLUTION | Freq: Four times a day (QID) | ORAL | 0 refills | Status: DC | PRN

## 2017-03-07 NOTE — Telephone Encounter (Signed)
Rx printed and referral placed

## 2017-03-17 DIAGNOSIS — M25662 Stiffness of left knee, not elsewhere classified: Secondary | ICD-10-CM | POA: Diagnosis not present

## 2017-03-17 DIAGNOSIS — M25462 Effusion, left knee: Secondary | ICD-10-CM | POA: Diagnosis not present

## 2017-04-07 DIAGNOSIS — J343 Hypertrophy of nasal turbinates: Secondary | ICD-10-CM | POA: Diagnosis not present

## 2017-04-08 DIAGNOSIS — M25562 Pain in left knee: Secondary | ICD-10-CM | POA: Diagnosis not present

## 2017-05-21 DIAGNOSIS — M1712 Unilateral primary osteoarthritis, left knee: Secondary | ICD-10-CM | POA: Diagnosis not present

## 2017-05-23 ENCOUNTER — Encounter: Payer: Self-pay | Admitting: Internal Medicine

## 2017-06-17 NOTE — Progress Notes (Signed)
Chief Complaint  Patient presents with  . Medication Refill    Tramadol     HPI: Rachel Huffman 56 y.o. come in for Chronic med management     Rls:  Has been diagnosed and been on tramadol 50 mg at night for a number of years. Previous medications, side effects are rare not as effective. She denies any side effects next day problem her husband noticesif she skips her misses a dose because of her leg movements at night.  Pharmacy gives her 30 pills at a time. Negative TAD.   Past history of recurrent sinusitis and congestion   Work up for  ent  And waiting to see if her recurrs.  ocass afrin   Use     Allergy testing negative in past.    ROS: See pertinent positives and negatives per HPI.  Past Medical History:  Diagnosis Date  . Allergy   . Asthma   . Ulcerative (chronic) proctitis (HCC)     Family History  Problem Relation Age of Onset  . Hyperlipidemia Mother        HT  . Heart disease Father        stents died of cva     Social History   Social History  . Marital status: Married    Spouse name: N/A  . Number of children: N/A  . Years of education: N/A   Social History Main Topics  . Smoking status: Never Smoker  . Smokeless tobacco: Never Used  . Alcohol use Yes     Comment: ocassionally  . Drug use: No  . Sexual activity: Not Asked   Other Topics Concern  . None   Social History Narrative   orig fr [place]   hh of 2    1 pet dog.   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  Soon       g2p2    Outpatient Medications Prior to Visit  Medication Sig Dispense Refill  . albuterol (PROAIR HFA) 108 (90 BASE) MCG/ACT inhaler INHALE 2 PUFFS INTO THE LUNGS EVERY 6 HOURS AS NEEDED FOR WHEEZING 17 g 2  . HYDROcodone-homatropine (HYCODAN) 5-1.5 MG/5ML syrup Take 5 mLs by mouth every 6 (six) hours as needed for cough. At night 90 mL 0  . traMADol (ULTRAM) 50 MG tablet Take 1 tablet (50 mg total)  by mouth at bedtime. For restless leg. 90 tablet 1  . predniSONE (DELTASONE) 20 MG tablet Take 3 po qd for 2 days then 2 po qd for 3 days,or as directed (Patient not taking: Reported on 06/19/2017) 12 tablet 0   No facility-administered medications prior to visit.      EXAM:  BP 122/80 (BP Location: Right Arm, Patient Position: Sitting, Cuff Size: Normal)   Pulse 90   Temp 97.9 F (36.6 C) (Oral)   Wt 205 lb 12.8 oz (93.4 kg)   BMI 32.23 kg/m   Body mass index is 32.23 kg/m.  GENERAL: vitals reviewed and listed above, alert, oriented, appears well hydrated and in no acute distress HEENT: atraumatic, conjunctiva  clear, no obvious abnormalities on inspection of external nose and ears appears slightly congested  NECK: no obvious masses on inspection palpation  LUNGS: clear to auscultation bilaterally, no wheezes, rales or rhonchi, good air movement CV: HRRR, no clubbing cyanosis or  peripheral edema nl cap refill  MS: moves all extremities without noticeable  focal  abnormality PSYCH: pleasant and cooperative, no obvious depression or anxiety Lab Results  Component Value Date   WBC 6.2 10/31/2016   HGB 13.9 10/31/2016   HCT 40.9 10/31/2016   PLT 304.0 10/31/2016   GLUCOSE 100 (H) 10/31/2016   CHOL 220 (H) 10/31/2016   TRIG 141.0 10/31/2016   HDL 66.40 10/31/2016   LDLCALC 126 (H) 10/31/2016   ALT 19 10/31/2016   AST 16 10/31/2016   NA 140 10/31/2016   K 4.3 10/31/2016   CL 105 10/31/2016   CREATININE 0.88 10/31/2016   BUN 13 10/31/2016   CO2 28 10/31/2016   TSH 1.96 10/31/2016   BP Readings from Last 3 Encounters:  06/19/17 122/80  02/17/17 120/80  12/24/16 118/80   Wt Readings from Last 3 Encounters:  06/19/17 205 lb 12.8 oz (93.4 kg)  02/17/17 206 lb 9.6 oz (93.7 kg)  12/24/16 205 lb 9.6 oz (93.3 kg)     ASSESSMENT AND PLAN:  Discussed the following assessment and plan:  Restless legs syndrome (RLS)  Medication management  Need for influenza  vaccination - Plan: Flu Vaccine QUAD 6+ mos PF IM (Fluarix Quad PF) Benefit more than risk medication to continue refill for 305 plan CPX with labs in about 6 months. Apparently allergy testing was negative for allergies so off of the Flonase and to follow-up with ENT with flares. -Patient advised to return or notify health care team  if  new concerns arise.  Patient Instructions  Glad the medication is still helping . Continue lifestyle intervention healthy eating and exercise .  CPX and labs at the visit in  6 months .      Neta Mends. Betrice Wanat M.D.

## 2017-06-19 ENCOUNTER — Encounter: Payer: Self-pay | Admitting: Internal Medicine

## 2017-06-19 ENCOUNTER — Ambulatory Visit (INDEPENDENT_AMBULATORY_CARE_PROVIDER_SITE_OTHER): Payer: 59 | Admitting: Internal Medicine

## 2017-06-19 VITALS — BP 122/80 | HR 90 | Temp 97.9°F | Wt 205.8 lb

## 2017-06-19 DIAGNOSIS — Z79899 Other long term (current) drug therapy: Secondary | ICD-10-CM

## 2017-06-19 DIAGNOSIS — G2581 Restless legs syndrome: Secondary | ICD-10-CM

## 2017-06-19 DIAGNOSIS — Z23 Encounter for immunization: Secondary | ICD-10-CM

## 2017-06-19 MED ORDER — TRAMADOL HCL 50 MG PO TABS: 50.0000 mg | ORAL_TABLET | Freq: Every day | ORAL | 5 refills | Status: DC

## 2017-06-19 NOTE — Patient Instructions (Signed)
Glad the medication is still helping . Continue lifestyle intervention healthy eating and exercise .  CPX and labs at the visit in  6 months .

## 2017-07-01 ENCOUNTER — Ambulatory Visit (INDEPENDENT_AMBULATORY_CARE_PROVIDER_SITE_OTHER): Payer: 59 | Admitting: Podiatry

## 2017-07-01 ENCOUNTER — Ambulatory Visit (INDEPENDENT_AMBULATORY_CARE_PROVIDER_SITE_OTHER): Payer: 59

## 2017-07-01 ENCOUNTER — Encounter: Payer: Self-pay | Admitting: Podiatry

## 2017-07-01 VITALS — BP 112/71 | HR 94 | Resp 16

## 2017-07-01 DIAGNOSIS — M779 Enthesopathy, unspecified: Secondary | ICD-10-CM

## 2017-07-01 DIAGNOSIS — M7751 Other enthesopathy of right foot: Secondary | ICD-10-CM | POA: Diagnosis not present

## 2017-07-01 DIAGNOSIS — S92314A Nondisplaced fracture of first metatarsal bone, right foot, initial encounter for closed fracture: Secondary | ICD-10-CM

## 2017-07-01 DIAGNOSIS — M778 Other enthesopathies, not elsewhere classified: Secondary | ICD-10-CM

## 2017-07-01 DIAGNOSIS — N951 Menopausal and female climacteric states: Secondary | ICD-10-CM | POA: Insufficient documentation

## 2017-07-01 MED ORDER — OXYCODONE-ACETAMINOPHEN 10-325 MG PO TABS: 1.0000 | ORAL_TABLET | ORAL | 0 refills | Status: DC | PRN

## 2017-07-01 NOTE — Progress Notes (Signed)
  Subjective:  Patient ID: Rachel Huffman, female    DOB: 12/25/1960,  MRN: [id number] HPI Chief Complaint  Patient presents with  . Foot Injury    1st MPJ/dorsal foot right - kicked something and jammed hallux x 2 weeks ago, tried Ibuprofen, Tylenol, ice, arthritis rubs-no help-gotten worse    56 y.o. female presents with the above complaint.    Past Medical History:  Diagnosis Date  . Allergy   . Asthma   . Ulcerative (chronic) proctitis [facility](HCC)    Past Surgical History:  Procedure Laterality Date  . KNEE SURGERY Left 07/2016  . NO PAST SURGERIES     No current outpatient prescriptions on file.  Allergies  Allergen Reactions  . Clarithromycin     REACTION: diarrhea  . Doxycycline Nausea Only   Review of Systems  All other systems reviewed and are negative.  Objective:   Vitals:   07/01/17 1439  BP: 112/71  Pulse: 94  Resp: 16    General: Well developed, nourished, in no acute distress, alert and oriented x3   Dermatological: Skin is warm, dry and supple bilateral. Nails x 10 are well maintained; remaining integument appears unremarkable at this time. There are no open sores, no preulcerative lesions, no rash or signs of infection present.  Vascular: Dorsalis Pedis artery and Posterior Tibial artery pedal pulses are 2/4 bilateral with immedate capillary fill time. Pedal hair growth present. No varicosities and no lower extremity edema present bilateral.   Neruologic: Grossly intact via light touch bilateral. Vibratory intact via tuning fork bilateral. Protective threshold with Semmes Wienstein monofilament intact to all pedal sites bilateral. Patellar and Achilles deep tendon reflexes 2+ bilateral. No Babinski or clonus noted bilateral.   Musculoskeletal: No gross boney pedal deformities bilateral. No pain, crepitus, or limitation noted with foot and ankle range of motion bilateral. Muscular strength 5/5 in all groups tested bilateral.Pain on palpation to the first  metatarsal and first intermetatarsal space. His pain on range of motion of the first metatarsal medial cuneiform joint.   Gait: Unassisted, Nonantalgic.    Radiographs:  3 views right foot taken today demonstrate what appears to be a fracture proximal lateral first metatarsal right foot. It is nondisplaced non-comminuted visible on AP and oblique view.  Assessment & Plan:   Assessment: Fracture first metatarsal nondisplaced right foot.  Plan: Placed her in a compression anklet recommended the use of the cam walker which she already had from her right foot fracture of the fifth metatarsal. Follow-up with her in 3-4 weeks. Instructed her to walk in this boot it all times and no bare feet.     Jabriel Vanduyne T. ClarksvilleHyatt, [place] Dakota[place]PM

## 2017-07-02 ENCOUNTER — Telehealth: Payer: Self-pay | Admitting: Podiatry

## 2017-07-02 NOTE — Telephone Encounter (Signed)
I saw Dr. Al CorpusHyatt yesterday and he diagnosed me as having a stress fracture in my right foot. I have a couple more questions of what I can and cannot do. If someone could call me back today, that would be great. Best contact number is [phone]. Thanks.

## 2017-07-02 NOTE — Telephone Encounter (Signed)
Left message for pt to call with questions, but I told her she needed to be in the boot at all times and cut her activities down to the acts of daily living only, no touring, shopping.

## 2017-07-08 ENCOUNTER — Telehealth: Payer: Self-pay | Admitting: Podiatry

## 2017-07-08 MED ORDER — OXYCODONE-ACETAMINOPHEN 10-325 MG PO TABS: 1.0000 | ORAL_TABLET | ORAL | 0 refills | Status: DC | PRN

## 2017-07-08 NOTE — Telephone Encounter (Signed)
Refill pain meds 

## 2017-07-08 NOTE — Telephone Encounter (Signed)
I informed pt of Dr. Geryl RankinsHyatt's orders to refill Percocet 10/325mg  as previously.

## 2017-07-08 NOTE — Addendum Note (Signed)
Addended by: Alphia Kava'CONNELL, VALERY D on: 07/08/2017 01:52 PM   Modules accepted: Orders

## 2017-07-08 NOTE — Telephone Encounter (Signed)
Medication refill for pain med

## 2017-07-22 ENCOUNTER — Ambulatory Visit (INDEPENDENT_AMBULATORY_CARE_PROVIDER_SITE_OTHER): Payer: 59

## 2017-07-22 ENCOUNTER — Ambulatory Visit: Payer: 59 | Admitting: Podiatry

## 2017-07-22 ENCOUNTER — Encounter: Payer: Self-pay | Admitting: Podiatry

## 2017-07-22 ENCOUNTER — Telehealth: Payer: Self-pay | Admitting: *Deleted

## 2017-07-22 DIAGNOSIS — S92314D Nondisplaced fracture of first metatarsal bone, right foot, subsequent encounter for fracture with routine healing: Secondary | ICD-10-CM

## 2017-07-22 MED ORDER — OXYCODONE-ACETAMINOPHEN 10-325 MG PO TABS: 1.0000 | ORAL_TABLET | Freq: Three times a day (TID) | ORAL | 0 refills | Status: DC | PRN

## 2017-07-22 NOTE — Progress Notes (Signed)
She presented today for follow-up of pain to base of her first metatarsal and intermetatarsal space after trauma. She states that she's been wearing her boot all the time and the foot has not started to diminish in his symptomatology. She states that if anything seems to be worse.  Objective: Vital signs are stable alert and oriented 3. There is mild ecchymosis around the worst metatarsal base which appears to be new there is some mild edema and fluid collection on the radiograph. The osseous structure of the base of the first metatarsal is unchanged that cannot be sure that the Lisfranc ligament is intact. There does appear to be some diastases between the medial cuneiform and the second metatarsal.  Assessment: Possible fracture or Lisfranc's injury first metatarsal base medial cuneiform area.  Plan: Requesting an MRI at this point since conservative therapy has failed to render her asymptomatic. This is more than likely a surgical consideration.

## 2017-07-22 NOTE — Telephone Encounter (Signed)
-----  Message from Rip Harbour, [facility] sent at 07/22/2017  2:09 PM EST ----- Regarding: MRI MRI foot right - evaluate 1st met base fracture with lisfranc ligament tear right - surgical consideration

## 2017-07-22 NOTE — Telephone Encounter (Signed)
Orders to Tomasa HoseJ. Quintana, RN fo pre-cert, and faxed to [facility].

## 2017-07-23 ENCOUNTER — Telehealth: Payer: Self-pay | Admitting: *Deleted

## 2017-07-23 NOTE — Telephone Encounter (Signed)
"  Patient is scheduled for a MRI on Tuesday.  MRI of right foot without contrast, [facility]."  [place] Shanda BumpsJessica know.

## 2017-07-28 ENCOUNTER — Telehealth: Payer: Self-pay | Admitting: *Deleted

## 2017-07-28 NOTE — Telephone Encounter (Signed)
Pt stated her 07/29/2017  2:15pm appt needs to be cancelled.

## 2017-07-28 NOTE — Telephone Encounter (Signed)
I spoke with Tomasa HoseJ. Quintana, RN and she states she was able to get the PA for pt's MRI. I left message informing pt.

## 2017-07-28 NOTE — Telephone Encounter (Signed)
Auth # [id number]  Exp 09/11/2017

## 2017-07-29 ENCOUNTER — Ambulatory Visit: Payer: 59 | Admitting: Podiatry

## 2017-07-29 ENCOUNTER — Ambulatory Visit
Admission: RE | Admit: 2017-07-29 | Discharge: 2017-07-29 | Disposition: A | Discharge status: Home / Self Care | Payer: 59 | Source: Ambulatory Visit | Attending: Podiatry | Admitting: Podiatry

## 2017-07-29 DIAGNOSIS — M71572 Other bursitis, not elsewhere classified, left ankle and foot: Secondary | ICD-10-CM | POA: Diagnosis not present

## 2017-07-30 ENCOUNTER — Telehealth: Payer: Self-pay | Admitting: Podiatry

## 2017-07-30 MED ORDER — OXYCODONE-ACETAMINOPHEN 10-325 MG PO TABS: 1.0000 | ORAL_TABLET | Freq: Three times a day (TID) | ORAL | 0 refills | Status: DC | PRN

## 2017-07-30 NOTE — Telephone Encounter (Signed)
I was calling to speak with a nurse. I wanted to talk about my MRI results from yesterday. I read them but I still need it explained. I'm also needing a refill of my pain medication. I'm still in pain and some of that is from the way they had my foot positioned yesterday. I would also like to talk about the next steps may be to help with my pain. If you could please call me back at your convenience. Thank you.

## 2017-07-30 NOTE — Addendum Note (Signed)
Addended by: Alphia Kava'CONNELL, Tyresse Jayson D on: 07/30/2017 05:00 PM   Modules accepted: Orders

## 2017-07-30 NOTE — Telephone Encounter (Signed)
I told pt I would refill the pain medication and she would benefit from getting her explanation of the MRI results from Dr. Al CorpusHyatt and transferred her to schedulers.

## 2017-07-31 ENCOUNTER — Ambulatory Visit: Payer: 59 | Admitting: Podiatry

## 2017-07-31 ENCOUNTER — Encounter: Payer: Self-pay | Admitting: Podiatry

## 2017-07-31 DIAGNOSIS — M7751 Other enthesopathy of right foot: Secondary | ICD-10-CM

## 2017-07-31 DIAGNOSIS — M19071 Primary osteoarthritis, right ankle and foot: Secondary | ICD-10-CM

## 2017-07-31 DIAGNOSIS — M778 Other enthesopathies, not elsewhere classified: Secondary | ICD-10-CM

## 2017-07-31 DIAGNOSIS — M779 Enthesopathy, unspecified: Principal | ICD-10-CM

## 2017-07-31 NOTE — Progress Notes (Signed)
She presents today for follow-up of pain to her right foot and a follow up of her MRI report. She states that the boot seems to be making her foot hurt worse.  Objective: Vital signs are stable she's alert and oriented 3 still has pain on palpation of the sesamoids as well as the metatarsal tarsometatarsal joints. MRI does state osteoarthritis of the tarsometatarsal joints and sesamoiditis right foot.  Assessment: DJD and sesamoiditis right tarsometatarsal joints.  Plan: Encouraged her to wear her insoles with her tennis shoes for the next 2-3 weeks we will reevaluate to see if orthotics need to be fabricated. She continued to have pain beneath the sesamoids we will give her an injection at that point.

## 2017-08-06 ENCOUNTER — Other Ambulatory Visit: Payer: Self-pay

## 2017-08-06 ENCOUNTER — Telehealth: Payer: Self-pay | Admitting: Podiatry

## 2017-08-06 MED ORDER — OXYCODONE-ACETAMINOPHEN 10-325 MG PO TABS: 1.0000 | ORAL_TABLET | Freq: Three times a day (TID) | ORAL | 0 refills | Status: DC | PRN

## 2017-08-06 NOTE — Telephone Encounter (Signed)
I was calling to see if I can get a refill of my pain medication and I can get it either tomorrow or Friday. I just came out of the boot and my foot is really sore. I'm going out of town next week for Christmas and I would make this last until I come in to see Dr. Al CorpusHyatt on 27 December. Thank you.

## 2017-08-19 ENCOUNTER — Ambulatory Visit: Payer: 59 | Admitting: Podiatry

## 2017-08-19 ENCOUNTER — Encounter: Payer: Self-pay | Admitting: Podiatry

## 2017-08-19 DIAGNOSIS — M779 Enthesopathy, unspecified: Secondary | ICD-10-CM

## 2017-08-19 DIAGNOSIS — M778 Other enthesopathies, not elsewhere classified: Secondary | ICD-10-CM

## 2017-08-19 DIAGNOSIS — M19071 Primary osteoarthritis, right ankle and foot: Secondary | ICD-10-CM | POA: Diagnosis not present

## 2017-08-19 DIAGNOSIS — M7751 Other enthesopathy of right foot: Secondary | ICD-10-CM | POA: Diagnosis not present

## 2017-08-19 MED ORDER — OXYCODONE-ACETAMINOPHEN 10-325 MG PO TABS: 1.0000 | ORAL_TABLET | Freq: Three times a day (TID) | ORAL | 0 refills | Status: DC | PRN

## 2017-08-19 NOTE — Progress Notes (Signed)
He presents today for follow-up of his degenerative joint disease and sesamoiditis of the right foot.  He states that it is swelled up so bad on Saturday night that she could hardly tolerate it.  Objective: Vital signs are stable alert and oriented x3 according to MRI there is a sesamoiditis and tarsometatarsal osteoarthritis right foot.  Assessment: Sesamoiditis capsulitis forefoot right.  Plan: Injected the area today with 20 mg Kenalog 5 mg Marcaine to the point of maximal tenderness after sterile Betadine skin prep and verbal consent.  She tolerated procedure well without comp occasions.  Discussed appropriate shoe gear stretching exercises ice therapy as your modifications.  Wrote a prescription for Percocet.  Will follow up with her in the near future.

## 2017-08-28 ENCOUNTER — Ambulatory Visit: Payer: 59 | Admitting: Podiatry

## 2017-08-28 ENCOUNTER — Telehealth: Payer: Self-pay | Admitting: Podiatry

## 2017-08-28 NOTE — Telephone Encounter (Signed)
I'm calling to request a refill of my pain medication for my foot. I see Dr. Al CorpusHyatt and he said he was going to keep me taking them until I get my orthotics. I have an appointment for those on 08 September 2017. I would like to get the prescription either tomorrow or Monday. Please call me back at [phone]. Thank you. Bye bye.

## 2017-08-29 MED ORDER — OXYCODONE-ACETAMINOPHEN 10-325 MG PO TABS: 1.0000 | ORAL_TABLET | Freq: Three times a day (TID) | ORAL | 0 refills | Status: DC | PRN

## 2017-08-29 NOTE — Telephone Encounter (Signed)
Pt states she can't get in to pick up the pain medication today she doesn't have a car. Left message informing pt she would be able to pick up the pain medication rx on Monday.

## 2017-08-29 NOTE — Addendum Note (Signed)
Addended by: Alphia Kava'CONNELL, Valborg Friar D on: 08/29/2017 09:19 AM   Modules accepted: Orders

## 2017-08-29 NOTE — Telephone Encounter (Signed)
Left message informing pt Dr. Al CorpusHyatt had refilled the Percocet and [place] prior to 4:00pm today.

## 2017-09-08 ENCOUNTER — Other Ambulatory Visit: Payer: 59 | Admitting: Orthotics

## 2017-09-10 ENCOUNTER — Telehealth: Payer: Self-pay | Admitting: Podiatry

## 2017-09-10 MED ORDER — OXYCODONE-ACETAMINOPHEN 10-325 MG PO TABS: 1.0000 | ORAL_TABLET | Freq: Three times a day (TID) | ORAL | 0 refills | Status: DC | PRN

## 2017-09-10 NOTE — Telephone Encounter (Signed)
Refill, but this will be her last refill for pain meds

## 2017-09-10 NOTE — Addendum Note (Signed)
Addended by: Alphia Kava'CONNELL, VALERY D on: 09/10/2017 05:24 PM   Modules accepted: Orders

## 2017-09-10 NOTE — Telephone Encounter (Signed)
Left message informing pt that Dr. Al CorpusHyatt had refilled the Percocet and she would be able to pick it up tomorrow.

## 2017-09-10 NOTE — Telephone Encounter (Signed)
I was calling to see if I can get a refill of my pain medication? I have an appointment to see Raiford NobleRick tomorrow to get my orthotics so I could pick it up then. [place] and even walking in tennis shoes still hurts me. If you would just give me a call back at [phone]. Thanks.

## 2017-09-11 ENCOUNTER — Ambulatory Visit (INDEPENDENT_AMBULATORY_CARE_PROVIDER_SITE_OTHER): Payer: 59 | Admitting: Orthotics

## 2017-09-11 DIAGNOSIS — S92314A Nondisplaced fracture of first metatarsal bone, right foot, initial encounter for closed fracture: Secondary | ICD-10-CM

## 2017-09-11 DIAGNOSIS — M19071 Primary osteoarthritis, right ankle and foot: Secondary | ICD-10-CM | POA: Diagnosis not present

## 2017-09-11 DIAGNOSIS — S92314D Nondisplaced fracture of first metatarsal bone, right foot, subsequent encounter for fracture with routine healing: Secondary | ICD-10-CM

## 2017-09-11 NOTE — Progress Notes (Signed)
Patient came into today to be cast for [employer]. Upon recommendation of Dr. Milinda Pointer Patient presents with sesmoiditis DJD, caps 1st met R Goals are offloading 1st met w/ k-wedge f/o and 1st met head cut out.  Plan vendor [employer]

## 2017-09-11 NOTE — Telephone Encounter (Signed)
I spoke with pt after her visit with Ria Clockick Puckett, and informed pt of Dr. Geryl RankinsHyatt's statement concerning last percocet rx and to watch the activities that irritate her foot. Pt states understanding.

## 2017-09-12 ENCOUNTER — Other Ambulatory Visit: Payer: Self-pay | Admitting: Obstetrics and Gynecology

## 2017-09-12 DIAGNOSIS — Z139 Encounter for screening, unspecified: Secondary | ICD-10-CM

## 2017-10-06 ENCOUNTER — Ambulatory Visit: Payer: 59

## 2017-10-07 ENCOUNTER — Other Ambulatory Visit: Payer: 59

## 2017-10-16 NOTE — Progress Notes (Signed)
Chief Complaint  Patient presents with  . Cough    Cough and sore throat x 3 days. Some body aches and decreased appetite. Denies fever. Having some some chest tightness. Pt exposed to granddaughter who was dx with croup. Pt states that she feels congested but does not cough anything up.    HPI: Rachel Huffman 57 y.o.  Not normal sinus infection    Croup   In grand child.  Onset 3 days cough sore throat hoarse  ? No wheezing please check .   Cough all night  Need help with this.   No fever.     No sob  No sinus pain  Hx of foot fracture now healed no longer on  Narcotics  ROS: See pertinent positives and negatives per HPI.  Past Medical History:  Diagnosis Date  . Allergy   . Asthma   . Ulcerative (chronic) proctitis (HCC)     Family History  Problem Relation Age of Onset  . Hyperlipidemia Mother        HT  . Heart disease Father        stents died of cva     Social History   Socioeconomic History  . Marital status: Married    Spouse name: None  . Number of children: None  . Years of education: None  . Highest education level: None  Social Needs  . Financial resource strain: None  . Food insecurity - worry: None  . Food insecurity - inability: None  . Transportation needs - medical: None  . Transportation needs - non-medical: None  Occupational History  . None  Tobacco Use  . Smoking status: Never Smoker  . Smokeless tobacco: Never Used  Substance and Sexual Activity  . Alcohol use: Yes    Comment: ocassionally  . Drug use: No  . Sexual activity: None  Other Topics Concern  . None  Social History Narrative   orig fr [place]   hh of 2    1 pet dog.   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  Soon       g2p2    Outpatient Medications Prior to Visit  Medication Sig Dispense Refill  . traMADol (ULTRAM) 50 MG tablet TK 1 T PO AT BEDTIME FOR RESTLESS LEG  1  . oxyCODONE-acetaminophen (PERCOCET)  10-325 MG tablet Take 1 tablet by mouth every 4 (four) hours as needed for pain. (Patient not taking: Reported on 10/17/2017) 30 tablet 0  . oxyCODONE-acetaminophen (PERCOCET) 10-325 MG tablet Take 1 tablet by mouth every 8 (eight) hours as needed for pain. (Patient not taking: Reported on 10/17/2017) 20 tablet 0  . oxyCODONE-acetaminophen (PERCOCET) 10-325 MG tablet Take 1 tablet by mouth every 8 (eight) hours as needed for pain. (Patient not taking: Reported on 10/17/2017) 15 tablet 0  . oxyCODONE-acetaminophen (PERCOCET) 10-325 MG tablet Take 1 tablet by mouth every 8 (eight) hours as needed for pain. (Patient not taking: Reported on 10/17/2017) 15 tablet 0  . oxyCODONE-acetaminophen (PERCOCET) 10-325 MG tablet Take 1 tablet by mouth every 8 (eight) hours as needed for pain. (Patient not taking: Reported on 10/17/2017) 15 tablet 0   No facility-administered medications prior to visit.      EXAM:  BP 124/84 (BP Location: Right Arm, Patient Position: Sitting, Cuff Size: Normal)   Pulse 90   Temp 98.2 F (36.8 C) (Oral)   Wt 208  lb 3.2 oz (94.4 kg)   BMI 32.61 kg/m   Body mass index is 32.61 kg/m. WDWN in NAD  quiet respirations; mildly congested  somewhat hoarse. Non toxic  ocass dry cough   No stridor . HEENT: Normocephalic ;atraumatic , Eyes;  PERRL, EOMs  Full, lids and conjunctiva clear,,Ears: no deformities, canals nl, TM landmarks normal, Nose: no deformity or discharge but congested;face non   tender Mouth : OP clear without lesion or edema . Neck: Supple without adenopathy or masses or bruits Chest:  Clear to A without wheezes rales or rhonchi CV:  S1-S2 no gallops or murmurs peripheral perfusion is normal Skin :nl perfusion and no acute rashes   ASSESSMENT AND PLAN:  Discussed the following assessment and plan:  Acute respiratory infection - viral   most likely from  croup exposures expectant managment  no wheezing today  Can add prednsinone if needed but no wheezing today and  expectant management  Caution with cough med  Can add pred if needed  Otherwise airway  Sx.  -Patient advised to return or notify health care team  if symptoms worsen ,persist or new concerns arise.  Patient Instructions   Cough med at night ,ucinex plain and hydration airway   Soothing.   If needed can add prednisone but you are not wheezing today .      Neta Mends. Suki Crockett M.D.

## 2017-10-17 ENCOUNTER — Encounter: Payer: Self-pay | Admitting: Internal Medicine

## 2017-10-17 ENCOUNTER — Ambulatory Visit: Payer: 59 | Admitting: Internal Medicine

## 2017-10-17 VITALS — BP 124/84 | HR 90 | Temp 98.2°F | Wt 208.2 lb

## 2017-10-17 DIAGNOSIS — J22 Unspecified acute lower respiratory infection: Secondary | ICD-10-CM

## 2017-10-17 MED ORDER — PREDNISONE 20 MG PO TABS: ORAL_TABLET | ORAL | 0 refills | Status: DC

## 2017-10-17 MED ORDER — HYDROCODONE-HOMATROPINE 5-1.5 MG/5ML PO SYRP: 5.0000 mL | ORAL_SOLUTION | Freq: Four times a day (QID) | ORAL | 0 refills | Status: DC | PRN

## 2017-10-17 NOTE — Patient Instructions (Signed)
  Cough med at night ,ucinex plain and hydration airway   Soothing.   If needed can add prednisone but you are not wheezing today .

## 2017-10-20 ENCOUNTER — Ambulatory Visit: Payer: 59

## 2017-10-24 ENCOUNTER — Other Ambulatory Visit: Payer: Self-pay | Admitting: Internal Medicine

## 2017-10-24 MED ORDER — HYDROCODONE-HOMATROPINE 5-1.5 MG/5ML PO SYRP: 5.0000 mL | ORAL_SOLUTION | Freq: Four times a day (QID) | ORAL | 0 refills | Status: DC | PRN

## 2017-10-24 NOTE — Telephone Encounter (Signed)
Please advise Dr Fabian SharpPanosh, thanks.  Hycodan last filled 10/17/17 with 90mL Pt is wanting a refill to take with her out of town.   Aldean AstLori A Decook would like a refill of the following medications:    HYDROcodone-homatropine (HYCODAN) 5-1.5 MG/5ML syrup Berniece Andreas[Wanda Panosh, MD]   Patient Comment: I'm going out of town next week for 10 days, would like to take some with me, feeling better, but cough still happening at night, thanks   [facility] pharmacy: [facility] [id number] - [place], [place] - [address] AT [address] [facility] [address]

## 2017-10-24 NOTE — Telephone Encounter (Signed)
Pt notified of refill. Nothing further needed.

## 2017-11-10 ENCOUNTER — Ambulatory Visit
Admission: RE | Admit: 2017-11-10 | Discharge: 2017-11-10 | Disposition: A | Discharge status: Home / Self Care | Payer: 59 | Source: Ambulatory Visit | Attending: Obstetrics and Gynecology | Admitting: Obstetrics and Gynecology

## 2017-11-10 DIAGNOSIS — Z139 Encounter for screening, unspecified: Secondary | ICD-10-CM

## 2017-11-10 DIAGNOSIS — Z1231 Encounter for screening mammogram for malignant neoplasm of breast: Secondary | ICD-10-CM | POA: Diagnosis not present

## 2017-11-13 ENCOUNTER — Encounter: Payer: Self-pay | Admitting: Internal Medicine

## 2017-11-24 ENCOUNTER — Telehealth: Payer: Self-pay | Admitting: Internal Medicine

## 2017-11-24 NOTE — Telephone Encounter (Signed)
Copied from CRM [phone]. Topic: Quick Communication - Rx Refill/Question >> Nov 24, 2017  2:11 PM Gerrianne ScalePayne, Takhia Spoon L wrote: Medication: traMADol (ULTRAM) 50 MG tablet Has the patient contacted their pharmacy? Yes.   (Agent: If no, request that the patient contact the pharmacy for the refill.) Preferred Pharmacy (with phone number or street name): [facility] [id number] [place] - [id number] [facility] [address] [facility] [phone] (Phone) [phone] (Fax)     Agent: Please be advised that RX refills may take up to 3 business days. We ask that you follow-up with your pharmacy.

## 2017-11-25 NOTE — Telephone Encounter (Signed)
Ultram refill Last OV: 02/17/17 Last Refill:07/03/17 "Historical Provider" Pharmacy:[facility] [address]

## 2017-11-25 NOTE — Telephone Encounter (Signed)
Last refilled 06/19/17 # 30 x 1rf Last rx sent stated that was not refill until April 5  Please advise Dr Fabian SharpPanosh, thanks.

## 2017-11-26 ENCOUNTER — Encounter: Payer: Self-pay | Admitting: Internal Medicine

## 2017-11-26 DIAGNOSIS — M1712 Unilateral primary osteoarthritis, left knee: Secondary | ICD-10-CM | POA: Diagnosis not present

## 2017-11-26 DIAGNOSIS — M170 Bilateral primary osteoarthritis of knee: Secondary | ICD-10-CM | POA: Diagnosis not present

## 2017-11-26 DIAGNOSIS — M25562 Pain in left knee: Secondary | ICD-10-CM | POA: Diagnosis not present

## 2017-11-27 MED ORDER — TRAMADOL HCL 50 MG PO TABS: ORAL_TABLET | ORAL | 1 refills | Status: DC

## 2017-11-28 NOTE — Telephone Encounter (Signed)
Pt states that she spoke to her pharmacy and she knows that we resent her Rx but it only had 1 refill. She wanted to be sure that we would refill this medication up to her July appt and they she will not go without. I advised that given the type of medication Dr Fabian SharpPanosh give limited refills but that we would refill until she is seen.   Expressed understanding. Nothing further needed.

## 2017-11-28 NOTE — Telephone Encounter (Signed)
Left detailed message on verified machine - aware rx filled. Nothing further needed.

## 2017-11-28 NOTE — Telephone Encounter (Signed)
Pt called and has a question about the Rx, call pt to advise

## 2017-12-16 DIAGNOSIS — L821 Other seborrheic keratosis: Secondary | ICD-10-CM | POA: Diagnosis not present

## 2018-01-05 IMAGING — MR MR FOOT*R* W/O CM
4 of 6 series · 20 of 40 positions shown · non-contrast
Comparison: None.

CLINICAL DATA: Pt complains of right 1st metatarsal pain. Previous
fracture 10 years ago.

EXAM:
MRI OF THE RIGHT FOREFOOT WITHOUT CONTRAST
TECHNIQUE: Multiplanar, multisequence MR imaging of the right forefoot was
performed. No intravenous contrast was administered.

[Series 4: T2 fat-sat · coronal · 4.0mm · 0.23mm/px · 8 of 37 slices shown (1 of 3)]
[im 1/37]
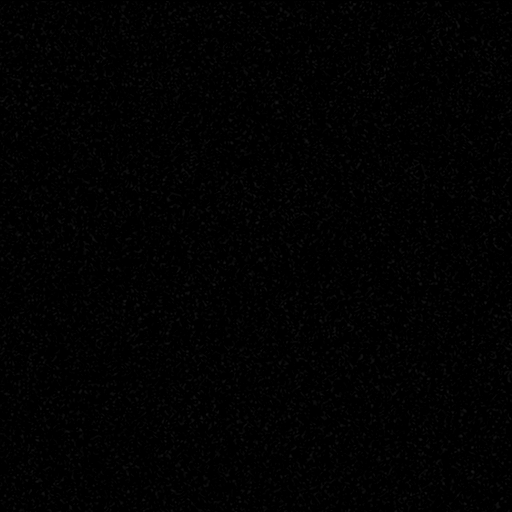
[im 6/37]
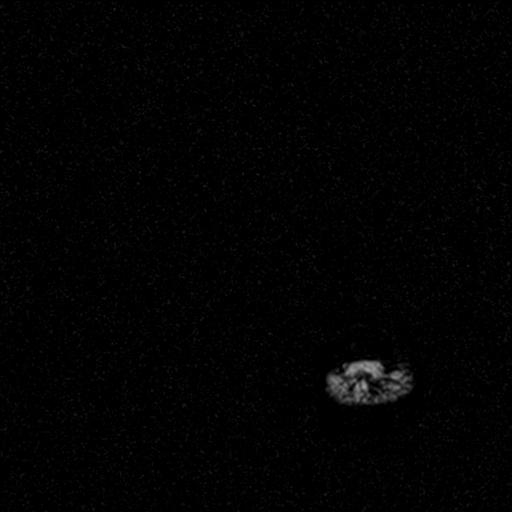
[im 11/37]
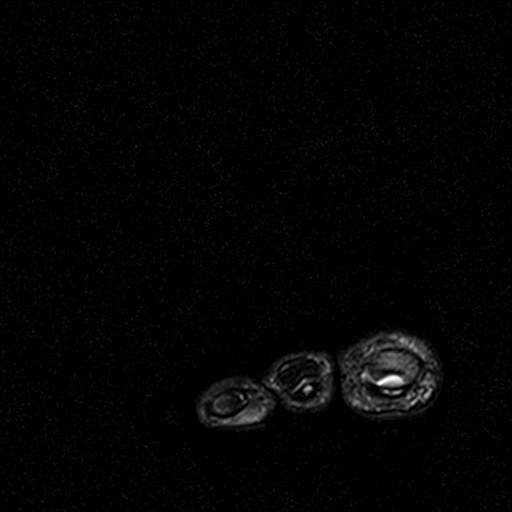
[im 16/37]
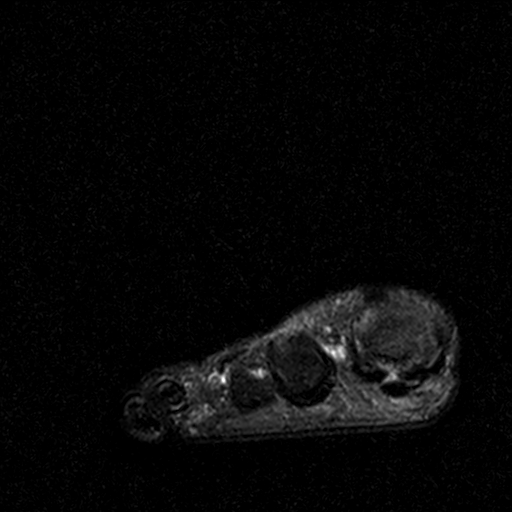
[im 21/37]
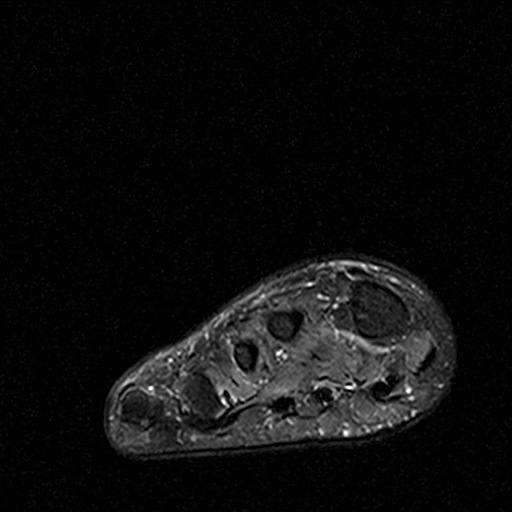
[im 26/37]
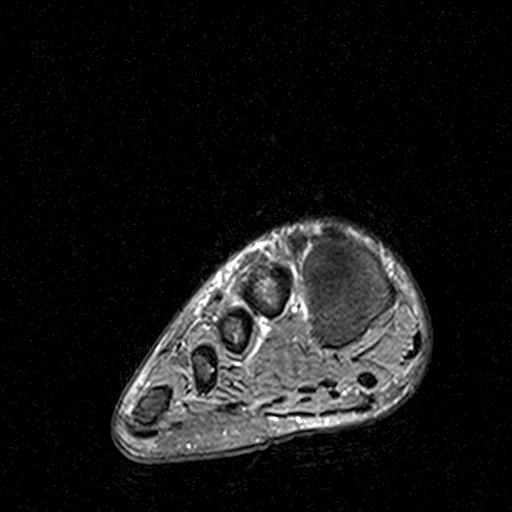
[im 31/37]
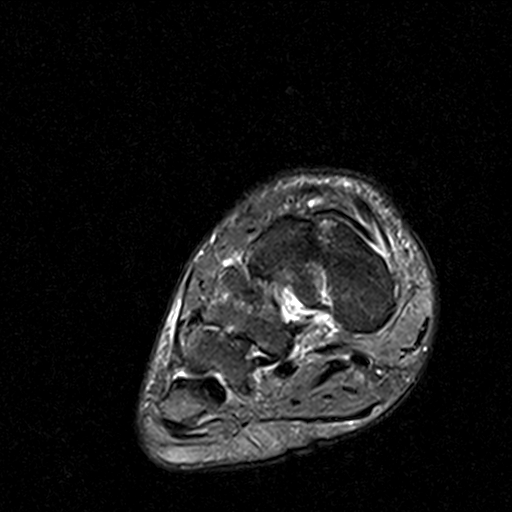
[im 37/37]
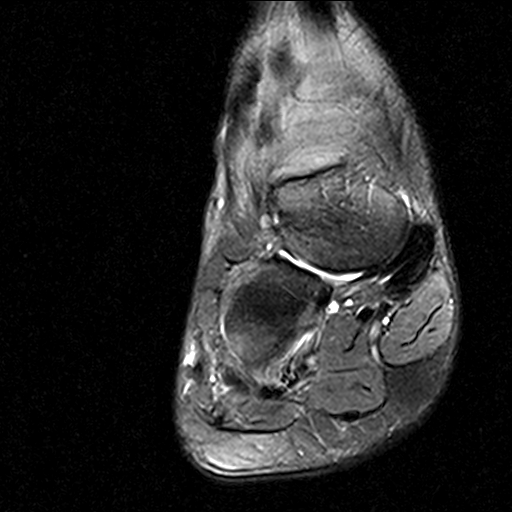

[Series 5: T1 · coronal · 4.0mm · 0.38mm/px · 3 of 37 slices shown]
[im 6/37]
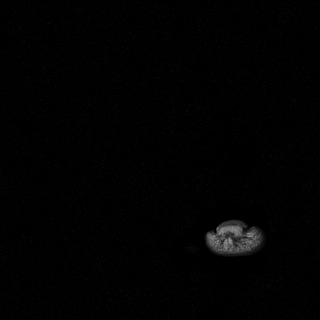
[im 21/37]
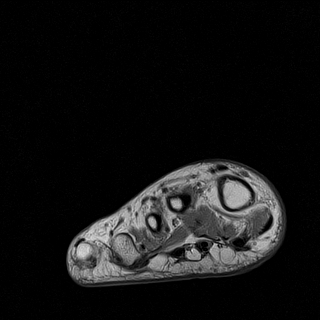
[im 31/37]
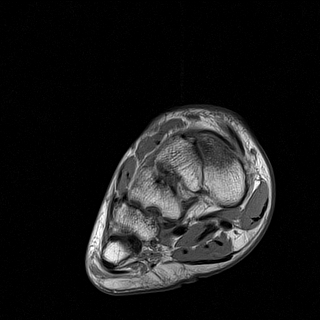

[Series 7: T2 fat-sat · axial · 2.5mm · 0.35mm/px · z∈[-33,+44]mm · 6 of 31 slices shown (2 of 3)]
[im 1/31]
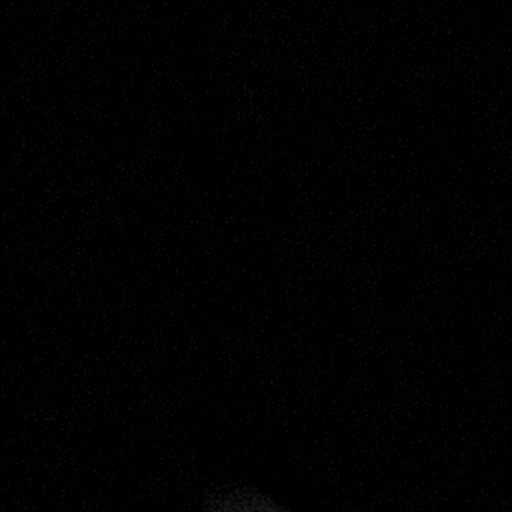
[im 7/31]
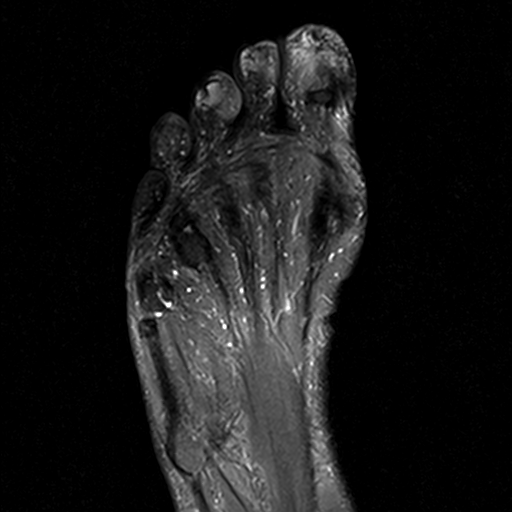
[im 13/31]
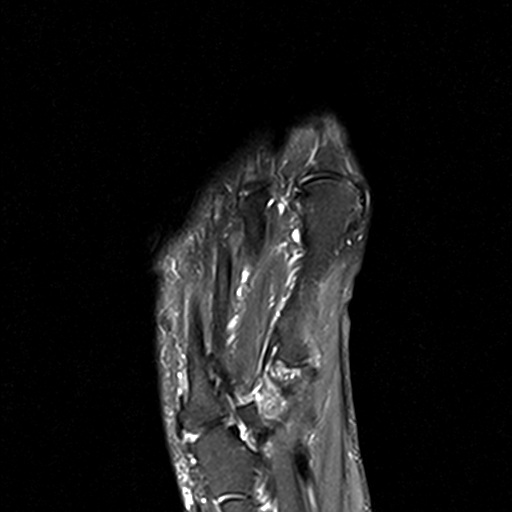
[im 19/31]
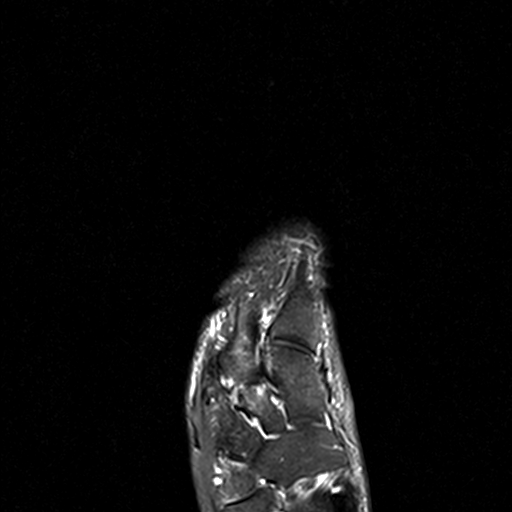
[im 25/31]
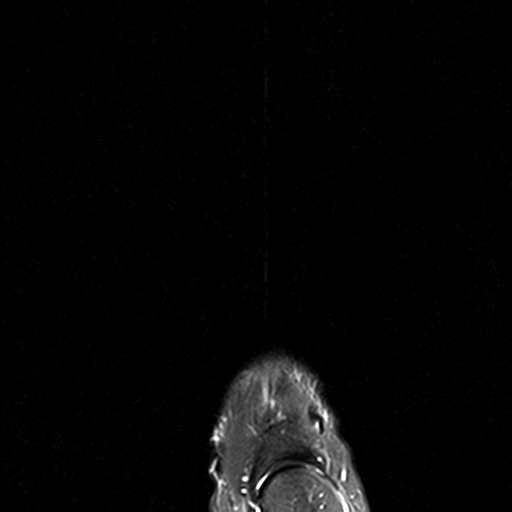
[im 31/31]
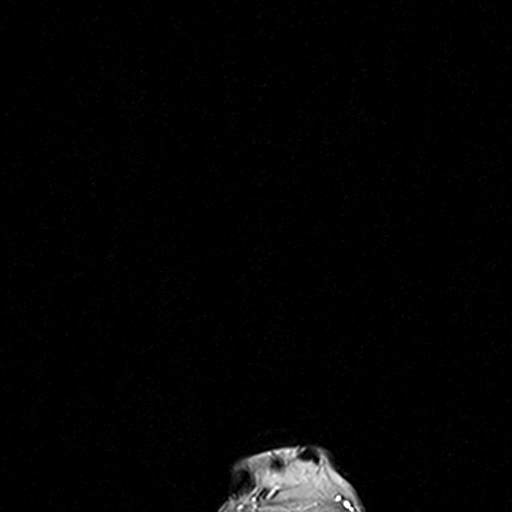

[Series 10: T2 fat-sat · sagittal · 3.0mm · 0.39mm/px · 3 of 29 slices shown (3 of 3)]
[im 6/29]
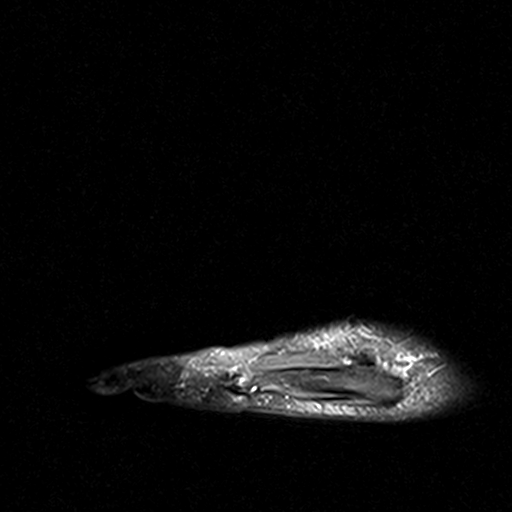
[im 17/29]
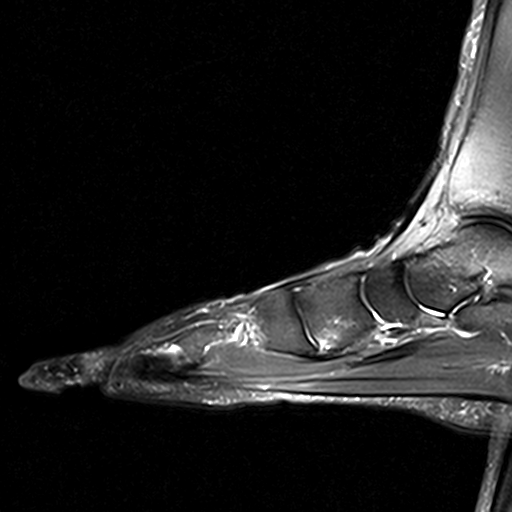
[im 29/29]
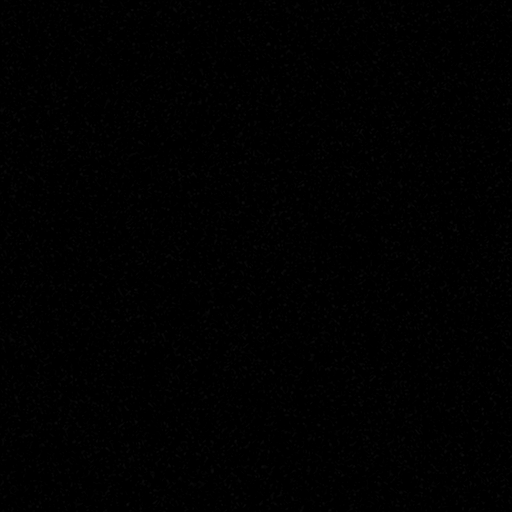

[20 of 40 positions shown; findings below may reference images not displayed]

FINDINGS: Bones/Joint/Cartilage

No acute fracture or dislocation. Normal alignment. No joint
effusion.

Mild osteoarthritis of the first and second tarsometatarsal joints.

Moderate osteoarthritis of the third tarsometatarsal joint.

Mild focal area of T2 hyperintensity of the base of the second
metatarsal likely reflecting sequela prior trauma or benign
chondroid lesion such as an enchondroma.

Mild osteoarthritis of the first MTP joint.

Marrow edema in the medial and lateral hallux sesamoids as can be
seen with sesamoiditis.

Small amount of fluid between the first and second and third and
fourth metatarsal heads as can be seen with intermetatarsal
bursitis.

Ligaments

Collateral ligaments are intact.  Intact Lisfranc ligament.

Muscles and Tendons
Flexor, peroneal and extensor compartment tendons are intact.
Muscles are normal. No muscle atrophy.

Soft tissue
No fluid collection or hematoma.  No soft tissue mass.
IMPRESSION: 1.  No acute osseous injury of the right foot.
2. Intermetatarsal bursitis between the first and second metatarsal
heads. Intermetatarsal bursitis between the third and fourth
metatarsal heads.
3. Marrow edema in the medial and lateral hallux sesamoids as can be
seen with sesamoiditis.
4. Moderate osteoarthritis of the third tarsometatarsal joint.
5. Mild osteoarthritis of the first and second tarsometatarsal
joints.

## 2018-01-08 ENCOUNTER — Encounter: Payer: Self-pay | Admitting: Internal Medicine

## 2018-01-08 ENCOUNTER — Ambulatory Visit: Payer: 59 | Admitting: Internal Medicine

## 2018-01-08 VITALS — BP 122/78 | HR 82 | Ht 67.0 in | Wt 207.2 lb

## 2018-01-08 DIAGNOSIS — E669 Obesity, unspecified: Secondary | ICD-10-CM | POA: Diagnosis not present

## 2018-01-08 DIAGNOSIS — Z1211 Encounter for screening for malignant neoplasm of colon: Secondary | ICD-10-CM

## 2018-01-08 DIAGNOSIS — K219 Gastro-esophageal reflux disease without esophagitis: Secondary | ICD-10-CM | POA: Diagnosis not present

## 2018-01-08 MED ORDER — PANTOPRAZOLE SODIUM 40 MG PO TBEC: 40.0000 mg | DELAYED_RELEASE_TABLET | Freq: Every day | ORAL | 0 refills | Status: DC

## 2018-01-08 NOTE — Patient Instructions (Signed)
  We have sent the following medications to your pharmacy for you to pick up at your convenience: Pantoprazole   Today we are giving you GERD information to read.   You may look into getting a wedge to sleep on from one of the medical supply stores.   We will see you July 10th at 10:30AM for a follow up visit.    I appreciate the opportunity to care for you. Stan Head, MD, [facility]

## 2018-01-08 NOTE — Progress Notes (Signed)
MACKENZEE BECVAR 57 y.o. 1960-12-30 [id number] Self-referred  Assessment & Plan:   Encounter Diagnoses  Name Primary?  . Gastroesophageal reflux disease, esophagitis presence not specified Yes  . Obesity (BMI 30.0-34.9)   . Colon cancer screening    Her symptoms seem very much like gastroesophageal reflux disease that is not controlled.  We discussed endoscopic evaluation versus treatment with MAC.  She prefers and I think it makes sense to treat with PPI, since she has had some success with pantoprazole we will try that but treat at suppertime prior to that given the preponderance of symptoms at night.  40 mg daily before supper prescription #90 no refills.  We reviewed and a handout regarding GERD lifestyle treatments were provided.  A wedge to sleep on may be of benefit.  She understands that she is overweight/obese and is trying to lose.  She is inclined to schedule a colonoscopy later this year for screening purposes.  I anticipate seeing her back in about 2 months to make sure she is doing well if she is not responding or other other questions in the interim she is advised to contact me by phone or my chart.   I appreciate the opportunity to care for this patient. 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  About 6 or 9 months ago she started having some intermittent heartburn symptoms something that is really happened over the years but it seemed to come back, she was often having symptoms at night.  She has borrowed some of her husband's pantoprazole and thinks that is helpful.  More lately especially in the past week she has had an increase in symptoms with a lot of belching when she lies on her left side but feels better on her right though she indicates she knows that is atypical, and  citric juices, spicy foods seem to make it worse.  She is been having regurgitation to the point where she is a little fearful of sleeping and actually has some coughing with this.  She said some upper abdominal discomfort as well.  Caffeine is 4 drinks a day.  0-2 alcoholic beverages a day and she is not a smoker.  She says her weight is up some and she realizes that and is inclined to try to lose.  She is thinking about that.  She is also been intermittently nauseous over the past week.  She does have daytime symptoms but most of the time she is bothered in the evening and at night when she is trying to sleep.  No dysphagia no bleeding, and no unintentional weight loss.  GI review of systems is otherwise negative.  She does says she was told she had gallstones 20 years ago.  No biliary colic symptoms.  No body aches myalgias etc. fevers lately with the flare in symptoms.  She is aware she has not had a screening colonoscopy and wants to do that this year. Allergies  Allergen Reactions  . Clarithromycin     REACTION: diarrhea  . Doxycycline Nausea Only   Current Meds  Medication Sig  . traMADol (ULTRAM) 50 MG tablet 1 PO AT BEDTIME FOR RESTLESS LEG (Patient taking differently: every 6 (six) hours as needed. 1 PO AT BEDTIME FOR RESTLESS LEG)   Past Medical History:  Diagnosis Date  .  Allergy   . Asthma   . Gallstones   . IBS (irritable bowel syndrome)   . Sinusitis    Past Surgical History:  Procedure Laterality Date  . KNEE SURGERY Left 07/2016   Social History   Social History Narrative   orig fr [place]   hh of 2    1 pet dog.   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   Review of Systems Snoring and allergy and sinus problems.  All other review of systems negative  or as per HPI  Objective:   Physical Exam _0  122/78   Pulse 82   Ht _1  (1.702 m)   Wt 207 lb 4 oz (94 kg)   BMI 32.46 kg/m @  General:  Well-developed, well-nourished and in no acute distress Eyes:  anicteric. ENT:   Mouth and posterior pharynx free of lesions.  Lungs: Clear to auscultation bilaterally. Heart:  S1S2, no rubs, murmurs, gallops. Abdomen:  soft, non-tender, no hepatosplenomegaly, hernia, or mass and BS+.  Lymph:  no cervical or supraclavicular adenopathy. Extremities:   no cyanosis or clubbing Neuro:  A&O x 3.  Psych:  appropriate mood and  Affect.   Data Reviewed: See HPI.  I reviewed primary care notes labs in the computer.

## 2018-01-13 NOTE — Progress Notes (Signed)
Chief Complaint  Patient presents with  . Sinus Problem    Pt c/o cough, pain in right face, pressure behind eye and in ear, head congestion, runny nose x 1.5 weeks. Denies fever, sneezing. Pt has tried OTC Ibuprofen and saline nasal spray. Pt states that her Flonase is too drying and makes pain worse.    HPI: Rachel Huffman 57 y.o.  SDA  fora bove     Right sided face pain and pressure  And bad.  Not responding to saline  And afrin  Onset like a cold no fever  almoset 2 weeks. And then 3 days pain shot through temple   And then ibuprofen    Nose running but no blowing .     Cough pnd  No sob  Or wheeze  Pain persiste over 3 days getting worse  ROS: See pertinent positives and negatives per HPI.  Past Medical History:  Diagnosis Date  . Allergy   . Asthma   . Gallstones   . IBS (irritable bowel syndrome)   . Sinusitis     Family History  Problem Relation Age of Onset  . Hyperlipidemia Mother        HT  . Heart disease Father        stents died of cva     Social History   Socioeconomic History  . Marital status: Married    Spouse name: Not on file  . Number of children: 2  . Years of education: Not on file  . Highest education level: Not on file  Occupational History  . Occupation: Futures trader  Social Needs  . Financial resource strain: Not on file  . Food insecurity:    Worry: Not on file    Inability: Not on file  . Transportation needs:    Medical: Not on file    Non-medical: Not on file  Tobacco Use  . Smoking status: Never Smoker  . Smokeless tobacco: Never Used  Substance and Sexual Activity  . Alcohol use: Yes    Comment: ocassionally  . Drug use: No  . Sexual activity: Yes    Partners: Male  Lifestyle  . Physical activity:    Days per week: Not on file    Minutes per session: Not on file  . Stress: Not on file  Relationships  . Social connections:    Talks on phone: Not on file    Gets together: Not on file    Attends religious service:  Not on file    Active member of club or organization: Not on file    Attends meetings of clubs or organizations: Not on file    Relationship status: Not on file  Other Topics Concern  . Not on file  Social History Narrative   orig fr [place]   hh of 2    1 pet dog.   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 pantoprazole (PROTONIX) 40 MG tablet Take 1 tablet (40 mg total) by mouth daily before supper. About 30-60 mins before 90 tablet 0  . traMADol (ULTRAM) 50 MG tablet 1 PO AT BEDTIME FOR RESTLESS LEG (Patient taking differently: every 6 (six) hours as needed. 1 PO AT BEDTIME FOR RESTLESS LEG) 30 tablet 1  No facility-administered medications prior to visit.      EXAM:  BP 138/82 (BP Location: Right Arm, Patient Position: Sitting, Cuff Size: Normal)   Pulse 78   Temp 98.2 F (36.8 C) (Oral)   Wt 206 lb 14.4 oz (93.8 kg)   BMI 32.41 kg/m   Body mass index is 32.41 kg/m. WDWN in NAD  quiet respirations; mod congested  somewhat hoarse. Non toxic . HEENT: Normocephalic ;atraumatic , Eyes;  PERRL, EOMs  Full, lids and conjunctiva clear,,Ears: no deformities, canals nl, TM landmarks normal, Nose: no deformity or discharge but congested;face right max 2+ tender and mild frontal tender Mouth : OP clear without lesion or edema . Neck: Supple without adenopathy or masses or bruits Chest:  Clear to A&P without wheezes rales or rhonchi CV:  S1-S2 no gallops or murmurs peripheral perfusion is normal Skin :nl perfusion and no acute rashes   ASSESSMENT AND PLAN:  Discussed the following assessment and plan:  Acute recurrent sinusitis, unspecified location - r maxilla unrespund to local measurres  add antibiotic and 3-5 days pred    Expectant management. And fu  If not responding to  therapy  -Patient advised to return or notify health care team  if symptoms worsen ,persist or new concerns arise.  Patient Instructions  Treatment for sinusitis   Warm compresses  In addition  Continue the saline .   Antibiotic and  3-4 days  of prednisone   Cough med for comfort  Chest exam  Is clear today   Neta Mends. Aldous Housel M.D.

## 2018-01-14 ENCOUNTER — Telehealth: Payer: Self-pay | Admitting: Internal Medicine

## 2018-01-14 ENCOUNTER — Ambulatory Visit: Payer: 59 | Admitting: Internal Medicine

## 2018-01-14 ENCOUNTER — Encounter: Payer: Self-pay | Admitting: Internal Medicine

## 2018-01-14 VITALS — BP 138/82 | HR 78 | Temp 98.2°F | Wt 206.9 lb

## 2018-01-14 DIAGNOSIS — J0191 Acute recurrent sinusitis, unspecified: Secondary | ICD-10-CM

## 2018-01-14 MED ORDER — AMOXICILLIN-POT CLAVULANATE 875-125 MG PO TABS: 1.0000 | ORAL_TABLET | Freq: Two times a day (BID) | ORAL | 0 refills | Status: DC

## 2018-01-14 MED ORDER — PROMETHAZINE-DM 6.25-15 MG/5ML PO SYRP: 5.0000 mL | ORAL_SOLUTION | Freq: Four times a day (QID) | ORAL | 0 refills | Status: DC | PRN

## 2018-01-14 MED ORDER — PREDNISONE 20 MG PO TABS: 20.0000 mg | ORAL_TABLET | Freq: Two times a day (BID) | ORAL | 0 refills | Status: DC

## 2018-01-14 NOTE — Patient Instructions (Signed)
Treatment for sinusitis   Warm compresses  In addition  Continue the saline .   Antibiotic and  3-4 days  of prednisone   Cough med for comfort  Chest exam  Is clear today

## 2018-01-14 NOTE — Telephone Encounter (Signed)
Copied from CRM [id number]. Topic: Quick Communication - See Telephone Encounter >> Jan 14, 2018 11:02 AM Lorrine Kin, NT wrote: CRM for notification. See Telephone encounter for: 01/14/18. Patient calling and states that the pharmacy states that the promethazine-dextromethorphan (PROMETHAZINE-DM) 6.25-15 MG/5ML syrup is on back order and they are not sure when they will have the medication back in stock. Patient states that she would like to try the hydromet(?) that she mentioned to Dr Fabian Sharp that she used to use. Please advise.  CB#: [phone] [id number] - [place],  - [address] AT [facility] [address]

## 2018-01-14 NOTE — Telephone Encounter (Signed)
Please advise Dr Panosh, thanks.   

## 2018-01-15 MED ORDER — HYDROCODONE-HOMATROPINE 5-1.5 MG/5ML PO SYRP: 5.0000 mL | ORAL_SOLUTION | Freq: Four times a day (QID) | ORAL | 0 refills | Status: DC | PRN

## 2018-01-15 NOTE — Telephone Encounter (Signed)
Will send in hydrocodone cough med

## 2018-01-15 NOTE — Telephone Encounter (Signed)
Pt aware that Rx was sent to pharmacy.  Nothing further needed.

## 2018-01-20 DIAGNOSIS — M1712 Unilateral primary osteoarthritis, left knee: Secondary | ICD-10-CM | POA: Diagnosis not present

## 2018-01-27 DIAGNOSIS — M1712 Unilateral primary osteoarthritis, left knee: Secondary | ICD-10-CM | POA: Diagnosis not present

## 2018-01-30 ENCOUNTER — Encounter: Payer: Self-pay | Admitting: Internal Medicine

## 2018-01-30 MED ORDER — TRAMADOL HCL 50 MG PO TABS: ORAL_TABLET | ORAL | 0 refills | Status: DC

## 2018-01-30 NOTE — Telephone Encounter (Signed)
Please advise Dr Panosh, thanks.   

## 2018-01-30 NOTE — Telephone Encounter (Signed)
asthyn ok to refill medication     Early   \Please  send in  30  Refill to make it work .   thanks

## 2018-01-30 NOTE — Telephone Encounter (Signed)
I cannot sign off on Tramadol. Rx is pending to be signed.  Please advise Dr Fabian Sharp, thanks.

## 2018-02-04 DIAGNOSIS — M1712 Unilateral primary osteoarthritis, left knee: Secondary | ICD-10-CM | POA: Diagnosis not present

## 2018-02-15 ENCOUNTER — Other Ambulatory Visit: Payer: Self-pay | Admitting: Internal Medicine

## 2018-02-24 ENCOUNTER — Encounter: Payer: 59 | Admitting: Internal Medicine

## 2018-03-03 ENCOUNTER — Ambulatory Visit (INDEPENDENT_AMBULATORY_CARE_PROVIDER_SITE_OTHER): Payer: 59 | Admitting: Internal Medicine

## 2018-03-03 ENCOUNTER — Encounter: Payer: Self-pay | Admitting: Internal Medicine

## 2018-03-03 VITALS — BP 151/93 | HR 73 | Temp 97.9°F | Ht 67.0 in | Wt 212.0 lb

## 2018-03-03 DIAGNOSIS — E669 Obesity, unspecified: Secondary | ICD-10-CM

## 2018-03-03 DIAGNOSIS — J309 Allergic rhinitis, unspecified: Secondary | ICD-10-CM

## 2018-03-03 DIAGNOSIS — G2581 Restless legs syndrome: Secondary | ICD-10-CM

## 2018-03-03 DIAGNOSIS — E785 Hyperlipidemia, unspecified: Secondary | ICD-10-CM | POA: Diagnosis not present

## 2018-03-03 DIAGNOSIS — Z79899 Other long term (current) drug therapy: Secondary | ICD-10-CM | POA: Diagnosis not present

## 2018-03-03 DIAGNOSIS — R030 Elevated blood-pressure reading, without diagnosis of hypertension: Secondary | ICD-10-CM

## 2018-03-03 DIAGNOSIS — Z0000 Encounter for general adult medical examination without abnormal findings: Secondary | ICD-10-CM

## 2018-03-03 DIAGNOSIS — R739 Hyperglycemia, unspecified: Secondary | ICD-10-CM

## 2018-03-03 LAB — CBC WITH DIFFERENTIAL/PLATELET
Basophils Absolute: 0.1 10*3/uL (ref 0.0–0.1)
Basophils Relative: 1 % (ref 0.0–3.0)
EOS PCT: 3.6 % (ref 0.0–5.0)
Eosinophils Absolute: 0.3 10*3/uL (ref 0.0–0.7)
HCT: 41.9 % (ref 36.0–46.0)
HEMOGLOBIN: 14.1 g/dL (ref 12.0–15.0)
Lymphocytes Relative: 19.8 % (ref 12.0–46.0)
Lymphs Abs: 1.4 10*3/uL (ref 0.7–4.0)
MCHC: 33.6 g/dL (ref 30.0–36.0)
MCV: 89.1 fl (ref 78.0–100.0)
MONO ABS: 0.4 10*3/uL (ref 0.1–1.0)
Monocytes Relative: 6.2 % (ref 3.0–12.0)
Neutro Abs: 5 10*3/uL (ref 1.4–7.7)
Neutrophils Relative %: 69.4 % (ref 43.0–77.0)
Platelets: 283 10*3/uL (ref 150.0–400.0)
RBC: 4.7 Mil/uL (ref 3.87–5.11)
RDW: 12.8 % (ref 11.5–15.5)
WBC: 7.1 10*3/uL (ref 4.0–10.5)

## 2018-03-03 LAB — LIPID PANEL
Cholesterol: 256 mg/dL — ABNORMAL HIGH (ref 0–200)
HDL: 72.4 mg/dL (ref >39.00)
LDL Cholesterol: 150 mg/dL — ABNORMAL HIGH (ref 0–99)
NONHDL: 183.17
Total CHOL/HDL Ratio: 4
Triglycerides: 166 mg/dL — ABNORMAL HIGH (ref 0.0–149.0)
VLDL: 33.2 mg/dL (ref 0.0–40.0)

## 2018-03-03 LAB — TSH: TSH: 1.87 u[IU]/mL (ref 0.35–4.50)

## 2018-03-03 LAB — HEPATIC FUNCTION PANEL
ALK PHOS: 94 U/L (ref 39–117)
ALT: 21 U/L (ref 0–35)
AST: 18 U/L (ref 0–37)
Albumin: 4.1 g/dL (ref 3.5–5.2)
BILIRUBIN TOTAL: 0.7 mg/dL (ref 0.2–1.2)
Bilirubin, Direct: 0.1 mg/dL (ref 0.0–0.3)
Total Protein: 6.5 g/dL (ref 6.0–8.3)

## 2018-03-03 LAB — BASIC METABOLIC PANEL
BUN: 15 mg/dL (ref 6–23)
CO2: 28 mEq/L (ref 19–32)
Calcium: 9.5 mg/dL (ref 8.4–10.5)
Chloride: 104 mEq/L (ref 96–112)
Creatinine, Ser: 0.8 mg/dL (ref 0.40–1.20)
GFR: 78.51 mL/min (ref >60.00)
Glucose, Bld: 100 mg/dL — ABNORMAL HIGH (ref 70–99)
POTASSIUM: 4.3 meq/L (ref 3.5–5.1)
Sodium: 140 mEq/L (ref 135–145)

## 2018-03-03 LAB — HEMOGLOBIN A1C: HEMOGLOBIN A1C: 6 % (ref 4.6–6.5)

## 2018-03-03 MED ORDER — TRAMADOL HCL 50 MG PO TABS: ORAL_TABLET | ORAL | 1 refills | Status: DC

## 2018-03-03 NOTE — Progress Notes (Signed)
Chief Complaint  Patient presents with  . Annual Exam    HPI: Patient  Rachel Huffman  57 y.o. comes in today for [place] visit  And med evaluations   Head sinus Congestion and sharp pains and going to dentist and  Not teeth  Antibiotic samoxicillin  7 per  And  Took prednisone   And some help received the pain .   Still tender  Right cheeck  aftin helped   tramadol at night for rls.  Still helps   bp usually good  Just got back from vacation not eating healthy and planning to get back on better eating  utd mammo  To get colon this fall  Health Maintenance  Topic Date Due  . Hepatitis C Screening  01-17-1961  . HIV Screening  12/04/1975  . COLONOSCOPY  12/04/2010  . PAP SMEAR  09/15/2015  . INFLUENZA VACCINE  04/02/2018  . MAMMOGRAM  11/11/2019  . 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     ROS:  Ur congestion as above some fatigue GEN/ HEENT: No fever, significant weight changes sweats headaches vision problems hearing changes, CV/ PULM; No chest pain shortness of breath cough, syncope,edema  change in exercise tolerance. GI /GU: No adominal pain, vomiting, change in bowel habits. No blood in the stool. No significant GU symptoms. SKIN/HEME: ,no acute skin rashes suspicious lesions or bleeding. No lymphadenopathy, nodules, masses.  NEURO/ PSYCH:  No neurologic signs such as weakness numbness. No depression anxiety. IMM/ Allergy: No unusual infections.  Allergy .   REST of 12 system review negative except as per HPI   Past Medical History:  Diagnosis Date  . Allergy   . Asthma   . Gallstones   . IBS (irritable bowel syndrome)   . Sinusitis     Past Surgical History:  Procedure Laterality Date  . KNEE SURGERY Left 07/2016    Family  History  Problem Relation Age of Onset  . Hyperlipidemia Mother        HT  . Heart disease Father        stents died of cva     Social History   Socioeconomic History  . Marital status: Married    Spouse name: Not on file  . Number of children: 2  . Years of education: Not on file  . Highest education level: Not on file  Occupational History  . Occupation: Agricultural engineer  Social Needs  . Financial resource strain: Not on file  . Food insecurity:    Worry: Not on file    Inability: Not on file  . Transportation needs:    Medical: Not on file    Non-medical: Not on file  Tobacco Use  . Smoking status: Never Smoker  . Smokeless tobacco: Never Used  Substance and Sexual Activity  . Alcohol use: Yes    Comment: ocassionally  . Drug use: No  . Sexual activity: Yes    Partners: Male  Lifestyle  . Physical activity:    Days per week: Not on file    Minutes per session: Not on file  . Stress: Not on file  Relationships  . Social connections:    Talks on phone: Not  on file    Gets together: Not on file    Attends religious service: Not on file    Active member of club or organization: Not on file    Attends meetings of clubs or organizations: Not on file    Relationship status: Not on file  Other Topics Concern  . Not on file  Social History Narrative   orig fr [place]   hh of 2    1 pet dog.   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 pantoprazole (PROTONIX) 40 MG tablet Take 1 tablet (40 mg total) by mouth daily before supper. About 30-60 mins before (Patient taking differently: Take 40 mg by mouth as needed. About 30-60 mins before) 90 tablet 0  . traMADol (ULTRAM) 50 MG tablet 1 PO AT BEDTIME FOR RESTLESS LEG 30 tablet 0  . amoxicillin-clavulanate (AUGMENTIN)  875-125 MG tablet Take 1 tablet by mouth every 12 (twelve) hours. (Patient not taking: Reported on 03/03/2018) 14 tablet 0  . HYDROcodone-homatropine (HYCODAN) 5-1.5 MG/5ML syrup Take 5 mLs by mouth every 6 (six) hours as needed for cough. (Patient not taking: Reported on 03/03/2018) 90 mL 0  . predniSONE (DELTASONE) 20 MG tablet Take 1 tablet (20 mg total) by mouth 2 (two) times daily with a meal. (Patient not taking: Reported on 03/03/2018) 10 tablet 0  . promethazine-dextromethorphan (PROMETHAZINE-DM) 6.25-15 MG/5ML syrup Take 5 mLs by mouth 4 (four) times daily as needed for cough. At night (Patient not taking: Reported on 03/03/2018) 118 mL 0   No facility-administered medications prior to visit.      EXAM:  BP (!) 151/93   Pulse 73   Temp 97.9 F (36.6 C)   Ht 5' 7" (1.702 m)   Wt 212 lb (96.2 kg)   BMI 33.20 kg/m   Body mass index is 33.2 kg/m. Wt Readings from Last 3 Encounters:  03/03/18 212 lb (96.2 kg)  01/14/18 206 lb 14.4 oz (93.8 kg)  01/08/18 207 lb 4 oz (94 kg)    Physical Exam: Vital signs reviewed CBS:WHQP is a well-developed well-nourished alert cooperative    who appearsr stated age in no acute distress.  HEENT: normocephalic atraumatic , Eyes: PERRL EOM's full, conjunctiva clear, Nares: paten,t no deformity discharge but congestedd  mild righg face  tenderness., Ears: no deformity EAC's clear TMs with normal landmarks. Mouth: clear OP, no lesions, edema.  Moist mucous membranes. Dentition in adequate repair. NECK: supple without masses, thyromegaly or bruits. CHEST/PULM:  Clear to auscultation and percussion breath sounds equal no wheeze , rales or rhonchi. No chest wall deformities or tenderness. Breast: normal by inspection . No dimpling, discharge, masses, tenderness or discharge . CV: PMI is nondisplaced, S1 S2 no gallops, murmurs, rubs. Peripheral pulses are full without delay.No JVD .  ABDOMEN: Bowel sounds normal nontender  No guard or rebound, no hepato  splenomegal no CVA tenderness.  No hernia. Extremtities:  No clubbing cyanosis or edema, no acute joint swelling or redness no focal atrophy NEURO:  Oriented x3, cranial nerves 3-12 appear to be intact, no obvious focal weakness,gait within normal limits no abnormal reflexes or asymmetrical SKIN: No acute rashes normal turgor, color, no bruising or petechiae. PSYCH: Oriented, good eye contact, no obvious depression anxiety,  cognition and judgment appear normal. LN: no cervical axillary inguinal adenopathy    BP Readings from Last 3 Encounters:  03/03/18 (!) 151/93  01/14/18 138/82  01/08/18 122/78   ASSESSMENT AND PLAN:  Discussed the following assessment and plan:  Visit for preventive health examination - Plan: Basic metabolic panel, CBC with Differential/Platelet, Hemoglobin A1c, Hepatic function panel, Lipid panel, TSH  Hyperlipidemia, unspecified hyperlipidemia type - Plan: Basic metabolic panel, CBC with Differential/Platelet, Hemoglobin A1c, Hepatic function panel, Lipid panel, TSH  Allergic sinusitis - Plan: Basic metabolic panel, CBC with Differential/Platelet, Hemoglobin A1c, Hepatic function panel, Lipid panel, TSH  Medication management - Plan: Basic metabolic panel, CBC with Differential/Platelet, Hemoglobin A1c, Hepatic function panel, Lipid panel, TSH  Restless legs syndrome (RLS) - Plan: Basic metabolic panel, CBC with Differential/Platelet, Hemoglobin A1c, Hepatic function panel, Lipid panel, TSH  Elevated blood pressure reading - recheck document  send i readings on  my chart   Obesity (BMI 30.0-34.9)  Controlled restless legs syndrome - refill med benefoit more thana risk   Hyperglycemia - rcnet pred  will check a1c   Patient Care Team: Panosh, Standley Brooking, MD as PCP - General (Internal Medicine) Allyn Kenner, DO as Consulting Physician (Obstetrics and Gynecology) Patient Instructions   See ENT for the face pain and sinus congestion.   Continueattention  lifestyle intervention healthy eating and exercise . dont eat lat at night   Get enough sleep  And avoid processed carbohydrates .   bp goal is 120/80 range .  Take blood pressure readings twice a day for 7- 10 days and then periodically   .Send in readings     healthy eating ( DASH diet) can help lower BP  But if continued up we can add medication or other intervnetion  ROV in 6 months med checkor depending on  Labs and how doing.   Health Maintenance, Female Adopting a healthy lifestyle and getting preventive care can go a long way to promote health and wellness. Talk with your health care provider about what schedule of regular examinations is right for you. This is a good chance for you to check in with your provider about disease prevention and staying healthy. In between checkups, there are plenty of things you can do on your own. Experts have done a lot of research about which lifestyle changes and preventive measures are most likely to keep you healthy. Ask your health care provider for more information. Weight and diet Eat a healthy diet  Be sure to include plenty of vegetables, fruits, low-fat dairy products, and lean protein.  Do not eat a lot of foods high in solid fats, added sugars, or salt.  Get regular exercise. This is one of the most important things you can do for your health. ? Most adults should exercise for at least 150 minutes each week. The exercise should increase your heart rate and make you sweat (moderate-intensity exercise). ? Most adults should also do strengthening exercises at least twice a week. This is in addition to the moderate-intensity exercise.  Maintain a healthy weight  Body mass index (BMI) is a measurement that can be used to identify possible weight problems. It estimates body fat based on height and weight. Your health care provider can help determine your BMI and help you achieve or maintain a healthy weight.  For females 26 years of age and  older: ? A BMI below 18.5 is considered underweight. ? A BMI of 18.5 to 24.9 is normal. ? A BMI  of 25 to 29.9 is considered overweight. ? A BMI of 30 and above is considered obese.  Watch levels of cholesterol and blood lipids  You should start having your blood tested for lipids and cholesterol at 57 years of age, then have this test every 5 years.  You may need to have your cholesterol levels checked more often if: ? Your lipid or cholesterol levels are high. ? You are older than 57 years of age. ? You are at high risk for heart disease.  Cancer screening Lung Cancer  Lung cancer screening is recommended for adults 62-50 years old who are at high risk for lung cancer because of a history of smoking.  A yearly low-dose CT scan of the lungs is recommended for people who: ? Currently smoke. ? Have quit within the past 15 years. ? Have at least a 30-pack-year history of smoking. A pack year is smoking an average of one pack of cigarettes a day for 1 year.  Yearly screening should continue until it has been 15 years since you quit.  Yearly screening should stop if you develop a health problem that would prevent you from having lung cancer treatment.  Breast Cancer  Practice breast self-awareness. This means understanding how your breasts normally appear and feel.  It also means doing regular breast self-exams. Let your health care provider know about any changes, no matter how small.  If you are in your 20s or 30s, you should have a clinical breast exam (CBE) by a health care provider every 1-3 years as part of a regular health exam.  If you are 41 or older, have a CBE every year. Also consider having a breast X-ray (mammogram) every year.  If you have a family history of breast cancer, talk to your health care provider about genetic screening.  If you are at high risk for breast cancer, talk to your health care provider about having an MRI and a mammogram every year.  Breast  cancer gene (BRCA) assessment is recommended for women who have family members with BRCA-related cancers. BRCA-related cancers include: ? Breast. ? Ovarian. ? Tubal. ? Peritoneal cancers.  Results of the assessment will determine the need for genetic counseling and BRCA1 and BRCA2 testing.  Cervical Cancer Your health care provider may recommend that you be screened regularly for cancer of the pelvic organs (ovaries, uterus, and vagina). This screening involves a pelvic examination, including checking for microscopic changes to the surface of your cervix (Pap test). You may be encouraged to have this screening done every 3 years, beginning at age 74.  For women ages 10-65, health care providers may recommend pelvic exams and Pap testing every 3 years, or they may recommend the Pap and pelvic exam, combined with testing for human papilloma virus (HPV), every 5 years. Some types of HPV increase your risk of cervical cancer. Testing for HPV may also be done on women of any age with unclear Pap test results.  Other health care providers may not recommend any screening for nonpregnant women who are considered low risk for pelvic cancer and who do not have symptoms. Ask your health care provider if a screening pelvic exam is right for you.  If you have had past treatment for cervical cancer or a condition that could lead to cancer, you need Pap tests and screening for cancer for at least 20 years after your treatment. If Pap tests have been discontinued, your risk factors (such as having a new sexual partner)  need to be reassessed to determine if screening should resume. Some women have medical problems that increase the chance of getting cervical cancer. In these cases, your health care provider may recommend more frequent screening and Pap tests.  Colorectal Cancer  This type of cancer can be detected and often prevented.  Routine colorectal cancer screening usually begins at 57 years of age and  continues through 57 years of age.  Your health care provider may recommend screening at an earlier age if you have risk factors for colon cancer.  Your health care provider may also recommend using home test kits to check for hidden blood in the stool.  A small camera at the end of a tube can be used to examine your colon directly (sigmoidoscopy or colonoscopy). This is done to check for the earliest forms of colorectal cancer.  Routine screening usually begins at age 109.  Direct examination of the colon should be repeated every 5-10 years through 57 years of age. However, you may need to be screened more often if early forms of precancerous polyps or small growths are found.  Skin Cancer  Check your skin from head to toe regularly.  Tell your health care provider about any new moles or changes in moles, especially if there is a change in a mole's shape or color.  Also tell your health care provider if you have a mole that is larger than the size of a pencil eraser.  Always use sunscreen. Apply sunscreen liberally and repeatedly throughout the day.  Protect yourself by wearing long sleeves, pants, a wide-brimmed hat, and sunglasses whenever you are outside.  Heart disease, diabetes, and high blood pressure  High blood pressure causes heart disease and increases the risk of stroke. High blood pressure is more likely to develop in: ? People who have blood pressure in the high end of the normal range (130-139/85-89 mm Hg). ? People who are overweight or obese. ? People who are African American.  If you are 21-67 years of age, have your blood pressure checked every 3-5 years. If you are 104 years of age or older, have your blood pressure checked every year. You should have your blood pressure measured twice-once when you are at a hospital or clinic, and once when you are not at a hospital or clinic. Record the average of the two measurements. To check your blood pressure when you are not  at a hospital or clinic, you can use: ? An automated blood pressure machine at a pharmacy. ? A home blood pressure monitor.  If you are between 21 years and 64 years old, ask your health care provider if you should take aspirin to prevent strokes.  Have regular diabetes screenings. This involves taking a blood sample to check your fasting blood sugar level. ? If you are at a normal weight and have a low risk for diabetes, have this test once every three years after 57 years of age. ? If you are overweight and have a high risk for diabetes, consider being tested at a younger age or more often. Preventing infection Hepatitis B  If you have a higher risk for hepatitis B, you should be screened for this virus. You are considered at high risk for hepatitis B if: ? You were born in a country where hepatitis B is common. Ask your health care provider which countries are considered high risk. ? Your parents were born in a high-risk country, and you have not been immunized against hepatitis  B (hepatitis B vaccine). ? You have HIV or AIDS. ? You use needles to inject street drugs. ? You live with someone who has hepatitis B. ? You have had sex with someone who has hepatitis B. ? You get hemodialysis treatment. ? You take certain medicines for conditions, including cancer, organ transplantation, and autoimmune conditions.  Hepatitis C  Blood testing is recommended for: ? Everyone born from [birth year] through 1965. ? Anyone with known risk factors for hepatitis C.  Sexually transmitted infections (STIs)  You should be screened for sexually transmitted infections (STIs) including gonorrhea and chlamydia if: ? You are sexually active and are younger than 57 years of age. ? You are older than 57 years of age and your health care provider tells you that you are at risk for this type of infection. ? Your sexual activity has changed since you were last screened and you are at an increased risk for chlamydia  or gonorrhea. Ask your health care provider if you are at risk.  If you do not have HIV, but are at risk, it may be recommended that you take a prescription medicine daily to prevent HIV infection. This is called pre-exposure prophylaxis (PrEP). You are considered at risk if: ? You are sexually active and do not regularly use condoms or know the HIV status of your partner(s). ? You take drugs by injection. ? You are sexually active with a partner who has HIV.  Talk with your health care provider about whether you are at high risk of being infected with HIV. If you choose to begin PrEP, you should first be tested for HIV. You should then be tested every 3 months for as long as you are taking PrEP. Pregnancy  If you are premenopausal and you may become pregnant, ask your health care provider about preconception counseling.  If you may become pregnant, take 400 to 800 micrograms (mcg) of folic acid every day.  If you want to prevent pregnancy, talk to your health care provider about birth control (contraception). Osteoporosis and menopause  Osteoporosis is a disease in which the bones lose minerals and strength with aging. This can result in serious bone fractures. Your risk for osteoporosis can be identified using a bone density scan.  If you are 53 years of age or older, or if you are at risk for osteoporosis and fractures, ask your health care provider if you should be screened.  Ask your health care provider whether you should take a calcium or vitamin D supplement to lower your risk for osteoporosis.  Menopause may have certain physical symptoms and risks.  Hormone replacement therapy may reduce some of these symptoms and risks. Talk to your health care provider about whether hormone replacement therapy is right for you. Follow these instructions at home:  Schedule regular health, dental, and eye exams.  Stay current with your immunizations.  Do not use any tobacco products  including cigarettes, chewing tobacco, or electronic cigarettes.  If you are pregnant, do not drink alcohol.  If you are breastfeeding, limit how much and how often you drink alcohol.  Limit alcohol intake to no more than 1 drink per day for nonpregnant women. One drink equals 12 ounces of beer, 5 ounces of wine, or 1 ounces of hard liquor.  Do not use street drugs.  Do not share needles.  Ask your health care provider for help if you need support or information about quitting drugs.  Tell your health care provider if you often  feel depressed.  Tell your health care provider if you have ever been abused or do not feel safe at home. This information is not intended to replace advice given to you by your health care provider. Make sure you discuss any questions you have with your health care provider. Document Released: 03/04/2011 Document Revised: 01/25/2016 Document Reviewed: 05/23/2015 Elsevier Interactive Patient Education  2018 [employer]. Karee Forge M.D.

## 2018-03-03 NOTE — Patient Instructions (Signed)
See ENT for the face pain and sinus congestion.   Continueattention lifestyle intervention healthy eating and exercise . dont eat lat at night   Get enough sleep  And avoid processed carbohydrates .   bp goal is 120/80 range .  Take blood pressure readings twice a day for 7- 10 days and then periodically   .Send in readings     healthy eating ( DASH diet) can help lower BP  But if continued up we can add medication or other intervnetion  ROV in 6 months med checkor depending on  Labs and how doing.   Health Maintenance, Female Adopting a healthy lifestyle and getting preventive care can go a long way to promote health and wellness. Talk with your health care provider about what schedule of regular examinations is right for you. This is a good chance for you to check in with your provider about disease prevention and staying healthy. In between checkups, there are plenty of things you can do on your own. Experts have done a lot of research about which lifestyle changes and preventive measures are most likely to keep you healthy. Ask your health care provider for more information. Weight and diet Eat a healthy diet  Be sure to include plenty of vegetables, fruits, low-fat dairy products, and lean protein.  Do not eat a lot of foods high in solid fats, added sugars, or salt.  Get regular exercise. This is one of the most important things you can do for your health. ? Most adults should exercise for at least 150 minutes each week. The exercise should increase your heart rate and make you sweat (moderate-intensity exercise). ? Most adults should also do strengthening exercises at least twice a week. This is in addition to the moderate-intensity exercise.  Maintain a healthy weight  Body mass index (BMI) is a measurement that can be used to identify possible weight problems. It estimates body fat based on height and weight. Your health care provider can help determine your BMI and help you  achieve or maintain a healthy weight.  For females 30 years of age and older: ? A BMI below 18.5 is considered underweight. ? A BMI of 18.5 to 24.9 is normal. ? A BMI of 25 to 29.9 is considered overweight. ? A BMI of 30 and above is considered obese.  Watch levels of cholesterol and blood lipids  You should start having your blood tested for lipids and cholesterol at 57 years of age, then have this test every 5 years.  You may need to have your cholesterol levels checked more often if: ? Your lipid or cholesterol levels are high. ? You are older than 57 years of age. ? You are at high risk for heart disease.  Cancer screening Lung Cancer  Lung cancer screening is recommended for adults 25-23 years old who are at high risk for lung cancer because of a history of smoking.  A yearly low-dose CT scan of the lungs is recommended for people who: ? Currently smoke. ? Have quit within the past 15 years. ? Have at least a 30-pack-year history of smoking. A pack year is smoking an average of one pack of cigarettes a day for 1 year.  Yearly screening should continue until it has been 15 years since you quit.  Yearly screening should stop if you develop a health problem that would prevent you from having lung cancer treatment.  Breast Cancer  Practice breast self-awareness. This means understanding how your breasts  normally appear and feel.  It also means doing regular breast self-exams. Let your health care provider know about any changes, no matter how small.  If you are in your 20s or 30s, you should have a clinical breast exam (CBE) by a health care provider every 1-3 years as part of a regular health exam.  If you are 26 or older, have a CBE every year. Also consider having a breast X-ray (mammogram) every year.  If you have a family history of breast cancer, talk to your health care provider about genetic screening.  If you are at high risk for breast cancer, talk to your health  care provider about having an MRI and a mammogram every year.  Breast cancer gene (BRCA) assessment is recommended for women who have family members with BRCA-related cancers. BRCA-related cancers include: ? Breast. ? Ovarian. ? Tubal. ? Peritoneal cancers.  Results of the assessment will determine the need for genetic counseling and BRCA1 and BRCA2 testing.  Cervical Cancer Your health care provider may recommend that you be screened regularly for cancer of the pelvic organs (ovaries, uterus, and vagina). This screening involves a pelvic examination, including checking for microscopic changes to the surface of your cervix (Pap test). You may be encouraged to have this screening done every 3 years, beginning at age 74.  For women ages 30-65, health care providers may recommend pelvic exams and Pap testing every 3 years, or they may recommend the Pap and pelvic exam, combined with testing for human papilloma virus (HPV), every 5 years. Some types of HPV increase your risk of cervical cancer. Testing for HPV may also be done on women of any age with unclear Pap test results.  Other health care providers may not recommend any screening for nonpregnant women who are considered low risk for pelvic cancer and who do not have symptoms. Ask your health care provider if a screening pelvic exam is right for you.  If you have had past treatment for cervical cancer or a condition that could lead to cancer, you need Pap tests and screening for cancer for at least 20 years after your treatment. If Pap tests have been discontinued, your risk factors (such as having a new sexual partner) need to be reassessed to determine if screening should resume. Some women have medical problems that increase the chance of getting cervical cancer. In these cases, your health care provider may recommend more frequent screening and Pap tests.  Colorectal Cancer  This type of cancer can be detected and often  prevented.  Routine colorectal cancer screening usually begins at 57 years of age and continues through 57 years of age.  Your health care provider may recommend screening at an earlier age if you have risk factors for colon cancer.  Your health care provider may also recommend using home test kits to check for hidden blood in the stool.  A small camera at the end of a tube can be used to examine your colon directly (sigmoidoscopy or colonoscopy). This is done to check for the earliest forms of colorectal cancer.  Routine screening usually begins at age 73.  Direct examination of the colon should be repeated every 5-10 years through 57 years of age. However, you may need to be screened more often if early forms of precancerous polyps or small growths are found.  Skin Cancer  Check your skin from head to toe regularly.  Tell your health care provider about any new moles or changes in moles,  especially if there is a change in a mole's shape or color.  Also tell your health care provider if you have a mole that is larger than the size of a pencil eraser.  Always use sunscreen. Apply sunscreen liberally and repeatedly throughout the day.  Protect yourself by wearing long sleeves, pants, a wide-brimmed hat, and sunglasses whenever you are outside.  Heart disease, diabetes, and high blood pressure  High blood pressure causes heart disease and increases the risk of stroke. High blood pressure is more likely to develop in: ? People who have blood pressure in the high end of the normal range (130-139/85-89 mm Hg). ? People who are overweight or obese. ? People who are African American.  If you are 51-25 years of age, have your blood pressure checked every 3-5 years. If you are 39 years of age or older, have your blood pressure checked every year. You should have your blood pressure measured twice-once when you are at a hospital or clinic, and once when you are not at a hospital or clinic.  Record the average of the two measurements. To check your blood pressure when you are not at a hospital or clinic, you can use: ? An automated blood pressure machine at a pharmacy. ? A home blood pressure monitor.  If you are between 19 years and 71 years old, ask your health care provider if you should take aspirin to prevent strokes.  Have regular diabetes screenings. This involves taking a blood sample to check your fasting blood sugar level. ? If you are at a normal weight and have a low risk for diabetes, have this test once every three years after 57 years of age. ? If you are overweight and have a high risk for diabetes, consider being tested at a younger age or more often. Preventing infection Hepatitis B  If you have a higher risk for hepatitis B, you should be screened for this virus. You are considered at high risk for hepatitis B if: ? You were born in a country where hepatitis B is common. Ask your health care provider which countries are considered high risk. ? Your parents were born in a high-risk country, and you have not been immunized against hepatitis B (hepatitis B vaccine). ? You have HIV or AIDS. ? You use needles to inject street drugs. ? You live with someone who has hepatitis B. ? You have had sex with someone who has hepatitis B. ? You get hemodialysis treatment. ? You take certain medicines for conditions, including cancer, organ transplantation, and autoimmune conditions.  Hepatitis C  Blood testing is recommended for: ? Everyone born from [birth year] through 1965. ? Anyone with known risk factors for hepatitis C.  Sexually transmitted infections (STIs)  You should be screened for sexually transmitted infections (STIs) including gonorrhea and chlamydia if: ? You are sexually active and are younger than 57 years of age. ? You are older than 57 years of age and your health care provider tells you that you are at risk for this type of infection. ? Your sexual  activity has changed since you were last screened and you are at an increased risk for chlamydia or gonorrhea. Ask your health care provider if you are at risk.  If you do not have HIV, but are at risk, it may be recommended that you take a prescription medicine daily to prevent HIV infection. This is called pre-exposure prophylaxis (PrEP). You are considered at risk if: ? You are sexually  active and do not regularly use condoms or know the HIV status of your partner(s). ? You take drugs by injection. ? You are sexually active with a partner who has HIV.  Talk with your health care provider about whether you are at high risk of being infected with HIV. If you choose to begin PrEP, you should first be tested for HIV. You should then be tested every 3 months for as long as you are taking PrEP. Pregnancy  If you are premenopausal and you may become pregnant, ask your health care provider about preconception counseling.  If you may become pregnant, take 400 to 800 micrograms (mcg) of folic acid every day.  If you want to prevent pregnancy, talk to your health care provider about birth control (contraception). Osteoporosis and menopause  Osteoporosis is a disease in which the bones lose minerals and strength with aging. This can result in serious bone fractures. Your risk for osteoporosis can be identified using a bone density scan.  If you are 30 years of age or older, or if you are at risk for osteoporosis and fractures, ask your health care provider if you should be screened.  Ask your health care provider whether you should take a calcium or vitamin D supplement to lower your risk for osteoporosis.  Menopause may have certain physical symptoms and risks.  Hormone replacement therapy may reduce some of these symptoms and risks. Talk to your health care provider about whether hormone replacement therapy is right for you. Follow these instructions at home:  Schedule regular health, dental,  and eye exams.  Stay current with your immunizations.  Do not use any tobacco products including cigarettes, chewing tobacco, or electronic cigarettes.  If you are pregnant, do not drink alcohol.  If you are breastfeeding, limit how much and how often you drink alcohol.  Limit alcohol intake to no more than 1 drink per day for nonpregnant women. One drink equals 12 ounces of beer, 5 ounces of wine, or 1 ounces of hard liquor.  Do not use street drugs.  Do not share needles.  Ask your health care provider for help if you need support or information about quitting drugs.  Tell your health care provider if you often feel depressed.  Tell your health care provider if you have ever been abused or do not feel safe at home. This information is not intended to replace advice given to you by your health care provider. Make sure you discuss any questions you have with your health care provider. Document Released: 03/04/2011 Document Revised: 01/25/2016 Document Reviewed: 05/23/2015 Elsevier Interactive Patient Education  [employer].

## 2018-03-06 ENCOUNTER — Other Ambulatory Visit: Payer: Self-pay | Admitting: *Deleted

## 2018-03-06 ENCOUNTER — Encounter: Payer: Self-pay | Admitting: Internal Medicine

## 2018-03-06 DIAGNOSIS — E785 Hyperlipidemia, unspecified: Secondary | ICD-10-CM

## 2018-03-09 DIAGNOSIS — J329 Chronic sinusitis, unspecified: Secondary | ICD-10-CM | POA: Diagnosis not present

## 2018-03-11 ENCOUNTER — Ambulatory Visit: Payer: 59 | Admitting: Internal Medicine

## 2018-04-06 ENCOUNTER — Other Ambulatory Visit: Payer: Self-pay

## 2018-04-06 MED ORDER — PANTOPRAZOLE SODIUM 40 MG PO TBEC: 40.0000 mg | DELAYED_RELEASE_TABLET | Freq: Every day | ORAL | 0 refills | Status: DC

## 2018-04-24 DIAGNOSIS — J329 Chronic sinusitis, unspecified: Secondary | ICD-10-CM | POA: Diagnosis not present

## 2018-04-24 DIAGNOSIS — R6884 Jaw pain: Secondary | ICD-10-CM | POA: Diagnosis not present

## 2018-04-24 DIAGNOSIS — J320 Chronic maxillary sinusitis: Secondary | ICD-10-CM | POA: Diagnosis not present

## 2018-05-08 DIAGNOSIS — J320 Chronic maxillary sinusitis: Secondary | ICD-10-CM | POA: Diagnosis not present

## 2018-05-08 DIAGNOSIS — R6884 Jaw pain: Secondary | ICD-10-CM | POA: Diagnosis not present

## 2018-05-22 DIAGNOSIS — J343 Hypertrophy of nasal turbinates: Secondary | ICD-10-CM | POA: Diagnosis not present

## 2018-05-22 DIAGNOSIS — J320 Chronic maxillary sinusitis: Secondary | ICD-10-CM | POA: Diagnosis not present

## 2018-05-26 DIAGNOSIS — M25561 Pain in right knee: Secondary | ICD-10-CM | POA: Diagnosis not present

## 2018-05-26 DIAGNOSIS — M25562 Pain in left knee: Secondary | ICD-10-CM | POA: Diagnosis not present

## 2018-05-26 DIAGNOSIS — M1711 Unilateral primary osteoarthritis, right knee: Secondary | ICD-10-CM | POA: Diagnosis not present

## 2018-06-29 ENCOUNTER — Other Ambulatory Visit: Payer: Self-pay

## 2018-06-29 MED ORDER — PANTOPRAZOLE SODIUM 40 MG PO TBEC: 40.0000 mg | DELAYED_RELEASE_TABLET | Freq: Every day | ORAL | 1 refills | Status: DC

## 2018-06-29 NOTE — Telephone Encounter (Signed)
Pantoprazole refilled as pharmacy requested. 

## 2018-07-08 DIAGNOSIS — M1711 Unilateral primary osteoarthritis, right knee: Secondary | ICD-10-CM | POA: Diagnosis not present

## 2018-07-10 ENCOUNTER — Ambulatory Visit (INDEPENDENT_AMBULATORY_CARE_PROVIDER_SITE_OTHER): Payer: 59

## 2018-07-10 DIAGNOSIS — Z23 Encounter for immunization: Secondary | ICD-10-CM | POA: Diagnosis not present

## 2018-07-15 DIAGNOSIS — M1711 Unilateral primary osteoarthritis, right knee: Secondary | ICD-10-CM | POA: Diagnosis not present

## 2018-07-22 DIAGNOSIS — M1711 Unilateral primary osteoarthritis, right knee: Secondary | ICD-10-CM | POA: Diagnosis not present

## 2018-08-10 NOTE — Telephone Encounter (Signed)
Please advise Dr Panosh, thanks.   

## 2018-08-11 MED ORDER — TRAMADOL HCL 50 MG PO TABS: ORAL_TABLET | ORAL | 0 refills | Status: DC

## 2018-09-04 ENCOUNTER — Encounter: Payer: Self-pay | Admitting: Physician Assistant

## 2018-09-04 ENCOUNTER — Ambulatory Visit: Payer: 59 | Admitting: Physician Assistant

## 2018-09-04 VITALS — BP 140/80 | HR 81 | Temp 98.6°F | Ht 67.0 in | Wt 213.0 lb

## 2018-09-04 DIAGNOSIS — J029 Acute pharyngitis, unspecified: Secondary | ICD-10-CM

## 2018-09-04 LAB — POCT MONO (EPSTEIN BARR VIRUS): Mono, POC: NEGATIVE

## 2018-09-04 LAB — POCT RAPID STREP A (OFFICE): Rapid Strep A Screen: NEGATIVE

## 2018-09-04 MED ORDER — PREDNISONE 20 MG PO TABS: 40.0000 mg | ORAL_TABLET | Freq: Every day | ORAL | 0 refills | Status: DC

## 2018-09-04 NOTE — Progress Notes (Signed)
Rachel Huffman is a 58 y.o. female here for a new problem.  I acted as a Neurosurgeon for [employer], PA-C Rachel Mull, LPN  History of Present Illness:   Chief Complaint  Patient presents with  . Sore Throat    Sore Throat   This is a new problem. Episode onset: Started a week ago. The problem has been gradually worsening. The pain is worse on the left side. There has been no fever. The pain is at a severity of 8/10. The pain is severe. Associated symptoms include ear pain (left), headaches, neck pain (stiffness), swollen glands (Left >Right) and trouble swallowing. Pertinent negatives include no congestion, coughing or shortness of breath. She has had exposure to strep. She has tried NSAIDs for the symptoms. The treatment provided mild relief.   Grandchildren with very similar symptoms.   Past Medical History:  Diagnosis Date  . Allergy   . Asthma   . Gallstones   . IBS (irritable bowel syndrome)   . Sinusitis      Social History   Socioeconomic History  . Marital status: Married    Spouse name: Not on file  . Number of children: 2  . Years of education: Not on file  . Highest education level: Not on file  Occupational History  . Occupation: Futures trader  Social Needs  . Financial resource strain: Not on file  . Food insecurity:    Worry: Not on file    Inability: Not on file  . Transportation needs:    Medical: Not on file    Non-medical: Not on file  Tobacco Use  . Smoking status: Never Smoker  . Smokeless tobacco: Never Used  Substance and Sexual Activity  . Alcohol use: Yes    Comment: ocassionally  . Drug use: No  . Sexual activity: Yes    Partners: Male  Lifestyle  . Physical activity:    Days per week: Not on file    Minutes per session: Not on file  . Stress: Not on file  Relationships  . Social connections:    Talks on phone: Not on file    Gets together: Not on file    Attends religious service: Not on file    Active member of club or  organization: Not on file    Attends meetings of clubs or organizations: Not on file    Relationship status: Not on file  . Intimate partner violence:    Fear of current or ex partner: Not on file    Emotionally abused: Not on file    Physically abused: Not on file    Forced sexual activity: Not on file  Other Topics Concern  . Not on file  Social History Narrative   orig fr [place]   hh of 2    1 pet dog.   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 KNEE SURGERY Left 07/2016    Family History  Problem Relation Age of Onset  . Hyperlipidemia Mother        HT  . Heart disease Father        stents died of cva     Allergies  Allergen Reactions  . Clarithromycin     REACTION: diarrhea  . Doxycycline Nausea Only  Current Medications:   Current Outpatient Medications:  .  pantoprazole (PROTONIX) 40 MG tablet, Take 1 tablet (40 mg total) by mouth daily before supper. About 30-60 mins before, Disp: 90 tablet, Rfl: 1 .  traMADol (ULTRAM) 50 MG tablet, 1 PO AT BEDTIME FOR RESTLESS LEG, Disp: 90 tablet, Rfl: 0 .  predniSONE (DELTASONE) 20 MG tablet, Take 2 tablets (40 mg total) by mouth daily., Disp: 10 tablet, Rfl: 0   Review of Systems:   Review of Systems  HENT: Positive for ear pain (left) and trouble swallowing. Negative for congestion.   Respiratory: Negative for cough and shortness of breath.   Musculoskeletal: Positive for neck pain (stiffness).  Neurological: Positive for headaches.    Vitals:   Vitals:   09/04/18 1357  BP: 140/80  Pulse: 81  Temp: 98.6 F (37 C)  TempSrc: Oral  SpO2: 96%  Weight: 213 lb (96.6 kg)  Height: 5\' 7"  (1.702 m)     Body mass index is 33.36 kg/m.  Physical Exam:   Physical Exam Vitals signs and nursing note reviewed.  Constitutional:       General: She is not in acute distress.    Appearance: She is well-developed. She is not ill-appearing or toxic-appearing.  HENT:     Head: Normocephalic and atraumatic.     Jaw: Swelling (mild, L >> R) present. No trismus.     Right Ear: Tympanic membrane, ear canal and external ear normal. Tympanic membrane is not erythematous, retracted or bulging.     Left Ear: Tympanic membrane, ear canal and external ear normal. Tympanic membrane is not erythematous, retracted or bulging.     Nose: Nose normal.     Right Sinus: No maxillary sinus tenderness or frontal sinus tenderness.     Left Sinus: No maxillary sinus tenderness or frontal sinus tenderness.     Mouth/Throat:     Lips: Lesions present.     Mouth: Mucous membranes are moist.     Pharynx: Uvula midline. Posterior oropharyngeal erythema present.     Tonsils: No tonsillar exudate. Swelling: 2+ on the right. 2+ on the left.  Eyes:     General: Lids are normal.     Conjunctiva/sclera: Conjunctivae normal.  Neck:     Musculoskeletal: Full passive range of motion without pain and normal range of motion.     Trachea: Trachea normal.  Cardiovascular:     Rate and Rhythm: Normal rate and regular rhythm.     Heart sounds: Normal heart sounds, S1 normal and S2 normal.  Pulmonary:     Effort: Pulmonary effort is normal.     Breath sounds: Normal breath sounds. No decreased breath sounds, wheezing, rhonchi or rales.  Lymphadenopathy:     Cervical: Cervical adenopathy present.  Skin:    General: Skin is warm and dry.  Neurological:     Mental Status: She is alert.  Psychiatric:        Speech: Speech normal.        Behavior: Behavior normal. Behavior is cooperative.       Results for orders placed or performed in visit on 09/04/18  POCT rapid strep A  Result Value Ref Range   Rapid Strep A Screen Negative Negative  POCT Mono (Epstein Barr Virus)  Result Value Ref Range   Mono, POC Negative Negative    Assessment and Plan:    Rachel Huffman was seen today for sore throat.  Diagnoses and all orders for this visit:  Sore throat No red  flags on exam.  Negative mono and strep. Will initiate oral prednisone per orders to help with inflammation. Discussed taking medications as prescribed. Reviewed return precautions including worsening fever, SOB, worsening cough or other concerns. Push fluids and rest. I recommend that patient follow-up if symptoms worsen or persist despite treatment x 7-10 days, sooner if needed. -     POCT rapid strep A -     POCT Mono (Epstein Barr Virus)  Other orders -     predniSONE (DELTASONE) 20 MG tablet; Take 2 tablets (40 mg total) by mouth daily.   . Reviewed expectations re: course of current medical issues. . Discussed self-management of symptoms. . Outlined signs and symptoms indicating need for more acute intervention. . Patient verbalized understanding and all questions were answered. . See orders for this visit as documented in the electronic medical record. . Patient received an After-Visit Summary.  CMA or LPN served as scribe during this visit. History, Physical, and Plan performed by medical provider. The above documentation has been reviewed and is accurate and complete.  Jarold Motto, PA-C

## 2018-09-04 NOTE — Patient Instructions (Signed)
It was great to see you!  You have a viral upper respiratory infection. Antibiotics are not needed for this.  Viral infections usually take 7-10 days to resolve.  The cough can last a few weeks to go away.  Use medication as prescribed: oral prednisone  Push fluids and get plenty of rest. Please return if you are not improving as expected, or if you have high fevers (>101.5) or difficulty swallowing or worsening productive cough.  Call clinic with questions.  I hope you start feeling better soon!

## 2018-09-08 ENCOUNTER — Encounter: Payer: Self-pay | Admitting: Family Medicine

## 2018-09-08 ENCOUNTER — Other Ambulatory Visit: Payer: 59

## 2018-09-08 ENCOUNTER — Ambulatory Visit: Payer: 59 | Admitting: Family Medicine

## 2018-09-08 VITALS — BP 144/92 | HR 58 | Temp 98.2°F | Ht 67.0 in | Wt 214.8 lb

## 2018-09-08 DIAGNOSIS — R030 Elevated blood-pressure reading, without diagnosis of hypertension: Secondary | ICD-10-CM | POA: Diagnosis not present

## 2018-09-08 DIAGNOSIS — J329 Chronic sinusitis, unspecified: Secondary | ICD-10-CM

## 2018-09-08 DIAGNOSIS — B9689 Other specified bacterial agents as the cause of diseases classified elsewhere: Secondary | ICD-10-CM | POA: Diagnosis not present

## 2018-09-08 MED ORDER — AZITHROMYCIN 250 MG PO TABS: ORAL_TABLET | ORAL | 0 refills | Status: DC

## 2018-09-08 NOTE — Patient Instructions (Signed)
Let's start azithromycin (would normally use augmentin but you dont tolerate from stomach perspective) for sinusitis. This would also cover for strep throat - which we want to cover with sore throat plus the exposures you have had.   If you are not doing better by early next week or symptoms worsen- please see Dr. Fabian Sharp back

## 2018-09-08 NOTE — Progress Notes (Signed)
Subjective:  Rachel Huffman is a 58 y.o. year old very pleasant female patient who presents for/with See problem oriented charting ROS-no fever or chills.  Does have some nausea and fatigue.  Complains of worsening sinus pressure.  Still with sore throat but mildly improved.  Past Medical History-  Patient Active Problem List   Diagnosis Date Noted  . Gastroesophageal reflux disease 01/08/2018  . Obesity (BMI 30.0-34.9) 01/08/2018  . Menopausal symptom 07/01/2017  . Nasal turbinate hypertrophy 04/07/2017  . Recurrent sinusitis 10/18/2014  . Visit for preventive health examination 06/10/2014  . Hyperlipidemia 06/10/2014  . Controlled restless legs syndrome 05/14/2014  . Medication management 05/14/2014  . Medication side effect 03/11/2014  . Encounter to establish care with new doctor 03/11/2014  . Cough, persistent 03/11/2014  . Postmenopausal symptoms 06/10/2013  . Right knee pain 06/10/2013  . IBS (irritable bowel syndrome) 06/10/2013  . Right ankle pain 04/06/2012  . Plantar fasciitis of right foot 04/06/2012  . Tendinitis of right ankle 04/06/2012  . RESTLESS LEG SYNDROME 07/24/2009  . ALLERGIC RHINITIS 05/13/2007  . ASTHMA 05/13/2007   Medications- reviewed and updated Current Outpatient Medications  Medication Sig Dispense Refill  . pantoprazole (PROTONIX) 40 MG tablet Take 1 tablet (40 mg total) by mouth daily before supper. About 30-60 mins before 90 tablet 1  . traMADol (ULTRAM) 50 MG tablet 1 PO AT BEDTIME FOR RESTLESS LEG 90 tablet 0   Objective: BP (!) 144/92   Pulse (!) 58   Temp 98.2 F (36.8 C) (Oral)   Ht 5\' 7"  (1.702 m)   Wt 214 lb 12.8 oz (97.4 kg)   SpO2 96%   BMI 33.64 kg/m  Gen: NAD, resting comfortably She has very tender maxillary sinuses and moderately tender frontal sinuses bilaterally.  Nasal turbinates erythematous with clear discharge.  Pharynx mildly erythematous. CV: RRR no murmurs rubs or gallops Lungs: CTAB no crackles, wheeze,  rhonchi Ext: no edema Skin: warm, dry Neuro: Speech normal, moves all extremities  Assessment/Plan:  Bacterial sinusitis S: Patient was seen 4 days ago at our office- Monospot and rapid strep were negative.  Likely viral pharyngitis-was given a course of prednisone and advised follow-up in 7 to 10 days if not improved  Daughter and son and law were both positive for strep and she was with them all over the holidays and recently. She tends to get a lot of sinus infections but this initially was different- She is starting to have increasing sinus pressure over the last 3 days. Having tiredness and mild nausea with as well.  She has been taking Motrin for sinus pressure. Noted some bad breath  She states with prednisone felt achy all over and finally stopped taking this yesterday. Almost 2 weeks of symptoms total but throat has actually worsened. Minimal discharge. No coughing. Has had ear ache and neck ache. Fatigue, tiredness, sinus pressure, and sore throat most prominent symptoms at present but sore throat is improving slightly.  A/P: Patient previously with viral pharyngitis and URI likely- with symptoms over 2 weeks and now worsening sinus pressure over the last 3 days-concerning for bacterial sinusitis.  She is concerned about strep throat and possible false negative rapid strep- we opted to treat her with azithromycin which would cover strep throat and give a reasonable chance of coverage for bacterial sinusitis.  I initially recommended Augmentin but patient states she does not tolerate this from a stomach perspective.  Elevated blood pressure S: Blood pressure poorly controlled on no antihypertensive-but  is on Motrin for sinus pressure. States blood pressure usually 120/80 BP Readings from Last 3 Encounters:  09/08/18 (!) 144/92  09/04/18 140/80  03/03/18 (!) 151/93- was on afrin  A/P: We discussed blood pressure goal of <140/90. I believe her blood pressure is elevated today due to had  the Motrin she is using.  I did repeat the blood pressure and it remained elevated at 144/92.  Luckily she has follow-up later this month with Dr. Fabian Sharp and we can see how her blood pressure is doing off the Motrin and when she is feeling better.  Recommended Tylenol as alternative for pain control  Future Appointments  Date Time Provider Department Center  09/23/2018  8:00 AM [facility]  09/25/2018  9:45 AM Panosh, Neta Mends, MD [facility]   Meds ordered this encounter  Medications  . azithromycin (ZITHROMAX) 250 MG tablet    Sig: Take 2 tabs on day 1, then 1 tab daily until finished    Dispense:  6 tablet    Refill:  0   Return precautions advised.  Tana Conch, MD

## 2018-09-10 ENCOUNTER — Ambulatory Visit: Payer: 59 | Admitting: Internal Medicine

## 2018-09-23 ENCOUNTER — Other Ambulatory Visit (INDEPENDENT_AMBULATORY_CARE_PROVIDER_SITE_OTHER): Payer: 59

## 2018-09-23 DIAGNOSIS — E785 Hyperlipidemia, unspecified: Secondary | ICD-10-CM | POA: Diagnosis not present

## 2018-09-23 LAB — LIPID PANEL
CHOL/HDL RATIO: 4
CHOLESTEROL: 240 mg/dL — AB (ref 0–200)
HDL: 67.1 mg/dL (ref >39.00)
LDL Cholesterol: 139 mg/dL — ABNORMAL HIGH (ref 0–99)
NonHDL: 172.86
TRIGLYCERIDES: 167 mg/dL — AB (ref 0.0–149.0)
VLDL: 33.4 mg/dL (ref 0.0–40.0)

## 2018-09-23 LAB — HEMOGLOBIN A1C: Hgb A1c MFr Bld: 5.9 % (ref 4.6–6.5)

## 2018-09-25 ENCOUNTER — Encounter: Payer: Self-pay | Admitting: Internal Medicine

## 2018-09-25 ENCOUNTER — Ambulatory Visit (INDEPENDENT_AMBULATORY_CARE_PROVIDER_SITE_OTHER): Payer: 59

## 2018-09-25 ENCOUNTER — Ambulatory Visit: Payer: Self-pay

## 2018-09-25 ENCOUNTER — Ambulatory Visit: Payer: 59 | Admitting: Internal Medicine

## 2018-09-25 VITALS — BP 142/80 | HR 110 | Temp 98.7°F | Wt 214.7 lb

## 2018-09-25 DIAGNOSIS — R51 Headache: Secondary | ICD-10-CM

## 2018-09-25 DIAGNOSIS — R05 Cough: Secondary | ICD-10-CM

## 2018-09-25 DIAGNOSIS — Z79899 Other long term (current) drug therapy: Secondary | ICD-10-CM

## 2018-09-25 DIAGNOSIS — J9811 Atelectasis: Secondary | ICD-10-CM | POA: Diagnosis not present

## 2018-09-25 DIAGNOSIS — R0602 Shortness of breath: Secondary | ICD-10-CM | POA: Diagnosis not present

## 2018-09-25 DIAGNOSIS — R058 Other specified cough: Secondary | ICD-10-CM

## 2018-09-25 DIAGNOSIS — R739 Hyperglycemia, unspecified: Secondary | ICD-10-CM

## 2018-09-25 DIAGNOSIS — R519 Headache, unspecified: Secondary | ICD-10-CM

## 2018-09-25 DIAGNOSIS — E785 Hyperlipidemia, unspecified: Secondary | ICD-10-CM

## 2018-09-25 MED ORDER — PREDNISONE 20 MG PO TABS: 20.0000 mg | ORAL_TABLET | Freq: Two times a day (BID) | ORAL | 0 refills | Status: DC

## 2018-09-25 MED ORDER — ALBUTEROL SULFATE HFA 108 (90 BASE) MCG/ACT IN AERS: 2.0000 | INHALATION_SPRAY | Freq: Four times a day (QID) | RESPIRATORY_TRACT | 0 refills | Status: DC | PRN

## 2018-09-25 MED ORDER — HYDROCODONE-HOMATROPINE 5-1.5 MG/5ML PO SYRP: 5.0000 mL | ORAL_SOLUTION | Freq: Three times a day (TID) | ORAL | 0 refills | Status: DC | PRN

## 2018-09-25 MED ORDER — LEVOFLOXACIN 500 MG PO TABS: 500.0000 mg | ORAL_TABLET | Freq: Every day | ORAL | 0 refills | Status: AC

## 2018-09-25 MED ORDER — KETOROLAC TROMETHAMINE 60 MG/2ML IM SOLN
60.0000 mg | Freq: Once | INTRAMUSCULAR | Status: AC
  Administered 2018-09-25: 60 mg via INTRAMUSCULAR

## 2018-09-25 NOTE — Telephone Encounter (Signed)
Patient called and says she received a shot in the office for a migraine and wants to know what else she can take. I advised that since she's on prednisone, to try taking Tylenol and no ibuprofen while on prednisone. She says Ibuprofen doesn't help and she will try Tylenol. Advised if the headache is not relieved with Tylenol and/or is worse to go to UC, she verbalized understanding.  Reason for Disposition . Health Information question, no triage required and triager able to answer question  Protocols used: INFORMATION ONLY CALL-A-AH

## 2018-09-25 NOTE — Patient Instructions (Addendum)
This could be an intervneting  Viral respiratory bronchitis   On toip of  Previous infections    You had z pack 2 weeks ago ?   Which as not that great for sinuses but good for  PNA   So changing antibiotic   Cough med  For comfort   X ray shows bronchitis   Can try inhaler  If needed  Not curative.  pred  As tolerated   Headache seems like  Sinus or  migriane type.  But if sinus we have given you medication for this   toradol  Today .  Push fluids   Expect improvement     In then in the next 48 -72 hours   contact us next week about how doing

## 2018-09-25 NOTE — Progress Notes (Signed)
Chief Complaint  Patient presents with  . Cough    cough and congestion x 2-3 days. Productive with green mucous. Headache started on Monday 1/20. SOB , wheezing and chest tightness. pt is having to sit up to sleep. Pt reports having flu vaccine. Seen by Dr Durene Cal about 2 weeks ago and was given a Zpak - tested neg for Strep and Mono. Pt feels that she never fully got rid of this the first time.     HPI: Rachel Huffman 58 y.o. come in for bblood suager and lipid fu but has been sick off and on in the past months management    3 dental surgeies  In past 6 mos  Sinus surgery right sinus 5 mos ago and  Since still pain  Root canal near sinus.  3 mos ago   Then still pain and cracked tooth. Extraction  .  Bact to Tuesday .   Onset Monday had inc right face pain at dentist ?   And given  amox icillin. On 3-4 daya nd has a wicked cough and chest pain middle  Had Monday .   This week cough .  Was wheezing  Last night.  With sob No fever reported  in am .  Feels miserable and sicjk   Report says  Had z pack  Jan 3   Declined augmenting cause of se  Poss .was mioslty better but not all   Has se of pred and some meds    ROS: See pertinent positives and negatives per HPI.  Past Medical History:  Diagnosis Date  . Allergy   . Asthma   . Gallstones   . IBS (irritable bowel syndrome)   . Sinusitis     Family History  Problem Relation Age of Onset  . Hyperlipidemia Mother        HT  . Heart disease Father        stents died of cva     Social History   Socioeconomic History  . Marital status: Married    Spouse name: Not on file  . Number of children: 2  . Years of education: Not on file  . Highest education level: Not on file  Occupational History  . Occupation: Futures trader  Social Needs  . Financial resource strain: Not on file  . Food insecurity:    Worry: Not on file    Inability: Not on file  . Transportation needs:    Medical: Not on file    Non-medical: Not on  file  Tobacco Use  . Smoking status: Never Smoker  . Smokeless tobacco: Never Used  Substance and Sexual Activity  . Alcohol use: Yes    Comment: ocassionally  . Drug use: No  . Sexual activity: Yes    Partners: Male  Lifestyle  . Physical activity:    Days per week: Not on file    Minutes per session: Not on file  . Stress: Not on file  Relationships  . Social connections:    Talks on phone: Not on file    Gets together: Not on file    Attends religious service: Not on file    Active member of club or organization: Not on file    Attends meetings of clubs or organizations: Not on file    Relationship status: Not on file  Other Topics Concern  . Not on file  Social History Narrative   orig fr [place]   hh of 2  1 pet dog.   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 pantoprazole (PROTONIX) 40 MG tablet Take 1 tablet (40 mg total) by mouth daily before supper. About 30-60 mins before 90 tablet 1  . traMADol (ULTRAM) 50 MG tablet 1 PO AT BEDTIME FOR RESTLESS LEG 90 tablet 0  . azithromycin (ZITHROMAX) 250 MG tablet Take 2 tabs on day 1, then 1 tab daily until finished (Patient not taking: Reported on 09/25/2018) 6 tablet 0   No facility-administered medications prior to visit.      EXAM:  BP (!) 142/80 (BP Location: Left Arm)   Pulse (!) 110   Temp 98.7 F (37.1 C)   Wt 214 lb 11.2 oz (97.4 kg)   SpO2 95%   BMI 33.63 kg/m   Body mass index is 33.63 kg/m.  GENERAL: vitals reviewed and listed above, alert, oriented, appears well hydrated and in mild disress cough and develop major HA during visit  bifromtal  And nausea  Deep bronchial cough  HEENT: atraumatic, conjunctiva  clear, no obvious abnormalities on inspection of external nose and ears tm clear very  congested OP : no lesion edema or exudate  NECK: no obvious masses on inspection palpation  LUNGS: clear to auscultation bilaterally, no wheezes, rales or rhonchi,CV: HRRR, no clubbing cyanosis or  peripheral edema nl cap refill  Abdomen:  Sof,t normal bowel sounds without hepatosplenomegaly, no guarding rebound or masses no CVA tenderness nuero non focal  Neuro no nfocal  MS: moves all extremities without noticeable focal  abnormality Lab Results  Component Value Date   WBC 7.1 03/03/2018   HGB 14.1 03/03/2018   HCT 41.9 03/03/2018   PLT 283.0 03/03/2018   GLUCOSE 100 (H) 03/03/2018   CHOL 240 (H) 09/23/2018   TRIG 167.0 (H) 09/23/2018   HDL 67.10 09/23/2018   LDLCALC 139 (H) 09/23/2018   ALT 21 03/03/2018   AST 18 03/03/2018   NA 140 03/03/2018   K 4.3 03/03/2018   CL 104 03/03/2018   CREATININE 0.80 03/03/2018   BUN 15 03/03/2018   CO2 28 03/03/2018   TSH 1.87 03/03/2018   HGBA1C 5.9 09/23/2018   BP Readings from Last 3 Encounters:  09/25/18 (!) 142/80  09/08/18 (!) 144/92  09/04/18 140/80    ASSESSMENT AND PLAN:  Discussed the following assessment and plan:  Respiratory tract congestion with cough - Plan: DG Chest 2 View  Shortness of breath - Plan: DG Chest 2 View  Headache, unspecified headache type - Plan: ketorolac (TORADOL) injection 60 mg  Medication management  Hyperlipidemia, unspecified hyperlipidemia type  Hyperglycemia Basically ongoing or for relapsing versus new illness respiratory wise with history of sinus teeth surgery previous treatment with a Z-Pak earlier in the month for upper respiratory symptoms and now above.  She feels quite ill and I am uncertain if this is viral or bacterial sinusitis.  She has a history of sinus surgery.  X-rays reassuring we gave her IM Toradol for severe headache that she never has and she denies migraines and has some relief before she left. Discussed options prednisone as tolerated change her amoxicillin to  Levaquin since she does not tolerate the Augmentin although if this is viral we will not make a difference. Cough  medicine on request albuterol inhaler as needed because of her symptoms although her x-ray is normal she may benefit this as she had significant wheezing and shortness of breath last night. In regard to her lipids and blood sugar reviewed her results we will plan follow-up at her normal preventive time in 6 months or earlier as indicated. Total visit > 50% spent counseling and coordinating care observation in clinic  Face to face .    as indicated in above note and in instructions to patient .   Risk benefit of medication discussed.  -Patient advised to return or notify health care team  if  new concerns arise.  Patient Instructions  This could be an intervneting  Viral respiratory bronchitis   On toip of  Previous infections    You had z pack 2 weeks ago ?   Which as not that great for sinuses but good for  PNA   So changing antibiotic   Cough med  For comfort   X ray shows bronchitis   Can try inhaler  If needed  Not curative.  pred  As tolerated   Headache seems like  Sinus or  migriane type.  But if sinus we have given you medication for this   toradol  Today .  Push fluids   Expect improvement     In then in the next 48 -72 hours   contact us next week about how doing         Burna Mortimer K. Jenipher Havel M.D.

## 2018-09-28 ENCOUNTER — Telehealth: Payer: Self-pay | Admitting: Internal Medicine

## 2018-09-28 NOTE — Telephone Encounter (Signed)
Copied from CRM [id number]. Topic: General - Other >> Sep 28, 2018  7:59 AM Leafy Ro wrote:  Reason for CRM: pt saw dr Fabian Sharp on Friday 09-25-2018 and was given levofloxacin . Pt is having upset stomach and she can smell and tell the pill is strong and would like to know if she can switch to amoxicillin 500 mg pt has enough med for three times a day for next 2 days. Pt has amoxicillin on hand

## 2018-09-30 ENCOUNTER — Other Ambulatory Visit: Payer: Self-pay

## 2018-10-02 ENCOUNTER — Other Ambulatory Visit: Payer: Self-pay

## 2018-10-02 MED ORDER — HYDROCODONE-HOMATROPINE 5-1.5 MG/5ML PO SYRP: 5.0000 mL | ORAL_SOLUTION | Freq: Three times a day (TID) | ORAL | 0 refills | Status: DC | PRN

## 2018-10-02 NOTE — Telephone Encounter (Signed)
Pt states she thinks she is slowly getting better. But does need a refill on cough medicine

## 2018-10-02 NOTE — Telephone Encounter (Signed)
Called pt and she states she is slowly getting better but does need a refill on Cough medication  Please advise

## 2018-10-02 NOTE — Telephone Encounter (Signed)
Apology for late response ?  And saw yesterdays  message  The cough is  Most likely reactive   I would stick out the antibiotic   Inhaler as needed  We can send in  A refill on the cough med  ifneeded  Expect mprovement  After the weekend   Plan rov next week if not getting better .

## 2018-10-14 NOTE — Telephone Encounter (Signed)
May take a while because you had this for so long   instead of more prednisone I would  Ask  You do   flonase nose spray ( cortisone  ) 2 psray each nostril every day   For the next month  And then as flar  Up for   Nose and sinusies

## 2018-11-05 ENCOUNTER — Other Ambulatory Visit: Payer: Self-pay

## 2018-11-06 MED ORDER — TRAMADOL HCL 50 MG PO TABS: ORAL_TABLET | ORAL | 0 refills | Status: DC

## 2018-11-06 NOTE — Telephone Encounter (Signed)
Last filled:08/11/18 Last OV:09/25/2018 Upcoming appt:03/23/2019

## 2018-11-24 DIAGNOSIS — M25561 Pain in right knee: Secondary | ICD-10-CM | POA: Diagnosis not present

## 2018-11-24 DIAGNOSIS — M25562 Pain in left knee: Secondary | ICD-10-CM | POA: Diagnosis not present

## 2018-12-16 DIAGNOSIS — M1712 Unilateral primary osteoarthritis, left knee: Secondary | ICD-10-CM | POA: Diagnosis not present

## 2018-12-21 ENCOUNTER — Other Ambulatory Visit: Payer: Self-pay | Admitting: Internal Medicine

## 2018-12-23 DIAGNOSIS — M1712 Unilateral primary osteoarthritis, left knee: Secondary | ICD-10-CM | POA: Diagnosis not present

## 2018-12-30 DIAGNOSIS — M1712 Unilateral primary osteoarthritis, left knee: Secondary | ICD-10-CM | POA: Diagnosis not present

## 2019-01-18 DIAGNOSIS — M25562 Pain in left knee: Secondary | ICD-10-CM | POA: Diagnosis not present

## 2019-01-18 DIAGNOSIS — M1712 Unilateral primary osteoarthritis, left knee: Secondary | ICD-10-CM | POA: Diagnosis not present

## 2019-01-21 MED ORDER — TRAMADOL HCL 50 MG PO TABS: ORAL_TABLET | ORAL | 0 refills | Status: DC

## 2019-01-21 NOTE — Telephone Encounter (Signed)
I sent in refill electronically. 

## 2019-01-27 DIAGNOSIS — M25562 Pain in left knee: Secondary | ICD-10-CM | POA: Diagnosis not present

## 2019-03-23 ENCOUNTER — Encounter: Payer: 59 | Admitting: Internal Medicine

## 2019-03-26 DIAGNOSIS — M1712 Unilateral primary osteoarthritis, left knee: Secondary | ICD-10-CM | POA: Insufficient documentation

## 2019-03-31 MED ORDER — TRAMADOL HCL 50 MG PO TABS: ORAL_TABLET | ORAL | 0 refills | Status: DC

## 2019-03-31 NOTE — Telephone Encounter (Signed)
Sent in electronically .  

## 2019-04-05 ENCOUNTER — Encounter (HOSPITAL_COMMUNITY): Payer: Self-pay | Admitting: Family Medicine

## 2019-04-05 ENCOUNTER — Inpatient Hospital Stay (HOSPITAL_COMMUNITY)
Admission: EM | Admit: 2019-04-05 | Discharge: 2019-04-09 | DRG: 502 | Disposition: A | Discharge status: Home Health | Payer: 59 | Attending: Specialist | Admitting: Specialist

## 2019-04-05 DIAGNOSIS — K589 Irritable bowel syndrome without diarrhea: Secondary | ICD-10-CM | POA: Diagnosis present

## 2019-04-05 DIAGNOSIS — M25562 Pain in left knee: Secondary | ICD-10-CM | POA: Diagnosis present

## 2019-04-05 DIAGNOSIS — Z8249 Family history of ischemic heart disease and other diseases of the circulatory system: Secondary | ICD-10-CM | POA: Diagnosis not present

## 2019-04-05 DIAGNOSIS — Z8349 Family history of other endocrine, nutritional and metabolic diseases: Secondary | ICD-10-CM

## 2019-04-05 DIAGNOSIS — J45909 Unspecified asthma, uncomplicated: Secondary | ICD-10-CM | POA: Diagnosis present

## 2019-04-05 DIAGNOSIS — Z6832 Body mass index (BMI) 32.0-32.9, adult: Secondary | ICD-10-CM | POA: Diagnosis not present

## 2019-04-05 DIAGNOSIS — M00062 Staphylococcal arthritis, left knee: Principal | ICD-10-CM | POA: Diagnosis present

## 2019-04-05 DIAGNOSIS — Z881 Allergy status to other antibiotic agents status: Secondary | ICD-10-CM | POA: Diagnosis not present

## 2019-04-05 DIAGNOSIS — E669 Obesity, unspecified: Secondary | ICD-10-CM | POA: Diagnosis present

## 2019-04-05 DIAGNOSIS — E785 Hyperlipidemia, unspecified: Secondary | ICD-10-CM | POA: Diagnosis present

## 2019-04-05 DIAGNOSIS — Z978 Presence of other specified devices: Secondary | ICD-10-CM | POA: Diagnosis not present

## 2019-04-05 DIAGNOSIS — B9561 Methicillin susceptible Staphylococcus aureus infection as the cause of diseases classified elsewhere: Secondary | ICD-10-CM | POA: Diagnosis present

## 2019-04-05 DIAGNOSIS — Z20828 Contact with and (suspected) exposure to other viral communicable diseases: Secondary | ICD-10-CM | POA: Diagnosis present

## 2019-04-05 DIAGNOSIS — G2581 Restless legs syndrome: Secondary | ICD-10-CM | POA: Diagnosis present

## 2019-04-05 DIAGNOSIS — M009 Pyogenic arthritis, unspecified: Secondary | ICD-10-CM

## 2019-04-05 MED ORDER — HYDROCODONE-ACETAMINOPHEN 7.5-325 MG PO TABS: 1.0000 | ORAL_TABLET | ORAL | Status: DC | PRN

## 2019-04-05 MED ORDER — ONDANSETRON HCL 4 MG/2ML IJ SOLN
4.0000 mg | Freq: Four times a day (QID) | INTRAMUSCULAR | Status: DC | PRN
  Administered 2019-04-06: 4 mg via INTRAVENOUS
  Filled 2019-04-05: qty 2

## 2019-04-05 MED ORDER — ONDANSETRON HCL 4 MG/2ML IJ SOLN
4.0000 mg | Freq: Once | INTRAMUSCULAR | Status: AC
  Administered 2019-04-05: 23:00:00 4 mg via INTRAVENOUS
  Filled 2019-04-05: qty 2

## 2019-04-05 MED ORDER — HYDROMORPHONE HCL 1 MG/ML IJ SOLN
0.5000 mg | INTRAMUSCULAR | Status: DC | PRN
  Administered 2019-04-06 (×6): 1 mg via INTRAVENOUS
  Filled 2019-04-05 (×6): qty 1

## 2019-04-05 MED ORDER — METHOCARBAMOL 500 MG PO TABS: 500.0000 mg | ORAL_TABLET | Freq: Four times a day (QID) | ORAL | Status: DC | PRN

## 2019-04-05 MED ORDER — PANTOPRAZOLE SODIUM 40 MG PO TBEC
40.0000 mg | DELAYED_RELEASE_TABLET | Freq: Every day | ORAL | Status: DC
  Administered 2019-04-07 – 2019-04-09 (×3): 40 mg via ORAL
  Filled 2019-04-05 (×3): qty 1

## 2019-04-05 MED ORDER — SODIUM CHLORIDE 0.9 % IV SOLN
INTRAVENOUS | Status: DC
  Administered 2019-04-05: via INTRAVENOUS

## 2019-04-05 MED ORDER — METHOCARBAMOL 1000 MG/10ML IJ SOLN
500.0000 mg | Freq: Four times a day (QID) | INTRAVENOUS | Status: DC | PRN
  Administered 2019-04-06: 500 mg via INTRAVENOUS
  Filled 2019-04-05 (×2): qty 5

## 2019-04-05 MED ORDER — POLYETHYLENE GLYCOL 3350 17 G PO PACK: 17.0000 g | PACK | Freq: Every day | ORAL | Status: DC | PRN

## 2019-04-05 MED ORDER — HYDROCODONE-ACETAMINOPHEN 5-325 MG PO TABS: 1.0000 | ORAL_TABLET | ORAL | Status: DC | PRN

## 2019-04-05 MED ORDER — BISACODYL 10 MG RE SUPP: 10.0000 mg | Freq: Every day | RECTAL | Status: DC | PRN

## 2019-04-05 MED ORDER — ACETAMINOPHEN 325 MG PO TABS: 325.0000 mg | ORAL_TABLET | Freq: Four times a day (QID) | ORAL | Status: DC | PRN

## 2019-04-05 MED ORDER — DIPHENHYDRAMINE HCL 12.5 MG/5ML PO ELIX: 12.5000 mg | ORAL_SOLUTION | ORAL | Status: DC | PRN

## 2019-04-05 MED ORDER — TRAMADOL HCL 50 MG PO TABS: 50.0000 mg | ORAL_TABLET | Freq: Every evening | ORAL | Status: DC | PRN

## 2019-04-05 MED ORDER — SODIUM CHLORIDE 0.9 % IV BOLUS
1000.0000 mL | Freq: Once | INTRAVENOUS | Status: AC
  Administered 2019-04-05: 1000 mL via INTRAVENOUS

## 2019-04-05 MED ORDER — HYDROMORPHONE HCL 1 MG/ML IJ SOLN
1.0000 mg | Freq: Once | INTRAMUSCULAR | Status: AC
  Administered 2019-04-05: 23:00:00 1 mg via INTRAVENOUS
  Filled 2019-04-05: qty 1

## 2019-04-05 MED ORDER — ONDANSETRON HCL 4 MG PO TABS: 4.0000 mg | ORAL_TABLET | Freq: Four times a day (QID) | ORAL | Status: DC | PRN

## 2019-04-05 MED ORDER — SODIUM CHLORIDE 0.9 % IV SOLN
1.0000 g | INTRAVENOUS | Status: DC
  Administered 2019-04-05: 1 g via INTRAVENOUS
  Filled 2019-04-05: qty 10

## 2019-04-05 MED ORDER — ACETAMINOPHEN 325 MG PO TABS
650.0000 mg | ORAL_TABLET | Freq: Once | ORAL | Status: AC
  Administered 2019-04-06: 650 mg via ORAL
  Filled 2019-04-05: qty 2

## 2019-04-05 MED ORDER — MAGNESIUM CITRATE PO SOLN: 1.0000 | Freq: Once | ORAL | Status: DC | PRN

## 2019-04-05 MED ORDER — DOCUSATE SODIUM 100 MG PO CAPS
100.0000 mg | ORAL_CAPSULE | Freq: Two times a day (BID) | ORAL | Status: DC
  Administered 2019-04-05: 100 mg via ORAL
  Filled 2019-04-05: qty 1

## 2019-04-05 MED ORDER — TRAMADOL HCL 50 MG PO TABS
50.0000 mg | ORAL_TABLET | Freq: Four times a day (QID) | ORAL | Status: DC
  Administered 2019-04-05: 50 mg via ORAL
  Filled 2019-04-05: qty 1

## 2019-04-05 MED ORDER — METHOCARBAMOL 1000 MG/10ML IJ SOLN
500.0000 mg | Freq: Once | INTRAMUSCULAR | Status: AC
  Administered 2019-04-06: 500 mg via INTRAMUSCULAR
  Filled 2019-04-05 (×2): qty 5

## 2019-04-05 MED ORDER — ALBUTEROL SULFATE (2.5 MG/3ML) 0.083% IN NEBU: 3.0000 mL | INHALATION_SOLUTION | Freq: Four times a day (QID) | RESPIRATORY_TRACT | Status: DC | PRN

## 2019-04-05 MED ORDER — VANCOMYCIN HCL 10 G IV SOLR
2000.0000 mg | Freq: Once | INTRAVENOUS | Status: AC
  Administered 2019-04-06: 01:00:00 2000 mg via INTRAVENOUS
  Filled 2019-04-05: qty 2000

## 2019-04-05 NOTE — ED Provider Notes (Addendum)
[place] DEPT Provider Note   CSN: [id number] Arrival date & time: 04/05/19  2152    History   Chief Complaint Chief Complaint  Patient presents with   Knee Pain    HPI Rachel Huffman is a 58 y.o. female with a history of obesity who presents to the emergency department with a chief complaint of left knee pain.  The patient reports that she is established with Dr. Theda Sers at [facility].  The patient reports that she recently had surgery to the left knee for partial medial and lateral meniscectomy and chondroplasty.  She reports that on Wednesday, 7/29 she noticed some swelling and drainage from the knee.  She reports that she began having severe pain on Saturday, 8/1 to the point that she was unable to put weight on her left leg.    She reports that she was seen by Dr. Theda Sers in the clinic and there was concern for infection versus gout.  She reports that she had fluid drained off of the knee with no improvement in her symptoms.  She also had blood work performed in the office.    She reports that she has been taking Keflex at home and pain medication without any improvement in her symptoms.  She was scheduled for surgery with Dr. Theda Sers for a washout tomorrow, but was advised to come to the ER for pain control after her pain significantly worsened tonight.  She states that the intensity of the pain is making it difficult to breathe.  She denies fever or chills, numbness or weakness.  No left ankle or hip pain.  Last dose of pain medication was 2 tablets of hydrocodone earlier this evening.     The history is provided by the patient. No language interpreter was used.    Past Medical History:  Diagnosis Date   Allergy    Asthma    Gallstones    IBS (irritable bowel syndrome)    Sinusitis     Patient Active Problem List   Diagnosis Date Noted   Septic joint of left knee joint ([place]) 04/05/2019   Osteoarthritis of left knee 03/26/2019     Gastroesophageal reflux disease 01/08/2018   Obesity (BMI 30.0-34.9) 01/08/2018   Menopausal symptom 07/01/2017   Nasal turbinate hypertrophy 04/07/2017   Recurrent sinusitis 10/18/2014   Visit for preventive health examination 06/10/2014   Hyperlipidemia 06/10/2014   Controlled restless legs syndrome 05/14/2014   Medication management 05/14/2014   Medication side effect 03/11/2014   Encounter to establish care with new doctor 03/11/2014   Cough, persistent 03/11/2014   Postmenopausal symptoms 06/10/2013   Right knee pain 06/10/2013   IBS (irritable bowel syndrome) 06/10/2013   Right ankle pain 04/06/2012   Plantar fasciitis of right foot 04/06/2012   Tendinitis of right ankle 04/06/2012   RESTLESS LEG SYNDROME 07/24/2009   ALLERGIC RHINITIS 05/13/2007   ASTHMA 05/13/2007    Past Surgical History:  Procedure Laterality Date   KNEE ARTHROSCOPY Left 04/06/2019   Procedure: IRRIGATION AND DEBRIDEMENT knee scope;  Surgeon: Sydnee Cabal, MD;  Location: [facility];  Service: Orthopedics;  Laterality: Left;   KNEE SURGERY Left 07/2016     OB History    Gravida  2   Para  2   Term  2   Preterm      AB      Living        SAB      TAB      Ectopic  Multiple      Live Births               Home Medications    Prior to Admission medications   Medication Sig Start Date End Date Taking? Authorizing Provider  albuterol (PROVENTIL HFA;VENTOLIN HFA) 108 (90 Base) MCG/ACT inhaler Inhale 2 puffs into the lungs every 6 (six) hours as needed for wheezing or shortness of breath. 09/25/18 09/25/19 Yes Panosh, Standley Brooking, MD  pantoprazole (PROTONIX) 40 MG tablet TAKE 1 TABLET BY MOUTH DAILY ABOUT 30 TO 60 MINUTES BEFORE SUPPER Patient taking differently: Take 40 mg by mouth daily.  12/21/18  Yes Gatha Mayer, MD  ceFAZolin (ANCEF) IVPB Inject 2 g into the vein every 8 (eight) hours for 26 days. Indication:  septic joint (Left Knee) Last Day of  Therapy:  05/05/2019 Labs - Once weekly:  CBC/D and BMP, ESR and CRP 04/09/19 05/05/19  Haus, Margarita Mail R, PA  celecoxib (CELEBREX) 200 MG capsule Take 1 capsule (200 mg total) by mouth daily. 04/09/19 05/09/19  Drue Novel, PA  cyclobenzaprine (FLEXERIL) 10 MG tablet Take 1 tablet (10 mg total) by mouth 3 (three) times daily as needed for muscle spasms. 04/09/19 05/09/19  Drue Novel, PA  gabapentin (NEURONTIN) 300 MG capsule Take 1 capsule (300 mg total) by mouth 2 (two) times daily. 04/09/19 05/09/19  Drue Novel, PA  oxyCODONE-acetaminophen (PERCOCET) 5-325 MG tablet Take 1-2 tablets by mouth every 6 (six) hours as needed for up to 7 days for severe pain. 04/09/19 04/16/19  Drue Novel, PA    Family History Family History  Problem Relation Age of Onset   Hyperlipidemia Mother        HT   Heart disease Father        stents died of cva     Social History Social History   Tobacco Use   Smoking status: Never Smoker   Smokeless tobacco: Never Used  Substance Use Topics   Alcohol use: Yes    Comment: ocassionally   Drug use: No     Allergies   Clarithromycin and Doxycycline   Review of Systems Review of Systems  Constitutional: Negative for activity change, chills and fever.  Respiratory: Negative for shortness of breath.   Cardiovascular: Negative for chest pain.  Gastrointestinal: Negative for abdominal pain, diarrhea, nausea and vomiting.  Genitourinary: Negative for dysuria.  Musculoskeletal: Positive for arthralgias and myalgias. Negative for back pain, neck pain and neck stiffness.  Skin: Positive for wound. Negative for rash.  Allergic/Immunologic: Negative for immunocompromised state.  Neurological: Negative for weakness, numbness and headaches.  Psychiatric/Behavioral: Negative for confusion.     Physical Exam Updated Vital Signs BP 126/83 (BP Location: Left Wrist)    Pulse (!) 107    Temp 99 F (37.2 C)    Resp 14    Ht '5\' 7"'  (1.702 m)    Wt 95.3 kg    SpO2  96%    BMI 32.89 kg/m   Physical Exam Vitals signs and nursing note reviewed.  Constitutional:      General: She is in acute distress.     Appearance: She is not ill-appearing, toxic-appearing or diaphoretic.     Comments: She appears uncomfortable and in distress secondary to pain  HENT:     Head: Normocephalic.  Eyes:     Conjunctiva/sclera: Conjunctivae normal.  Neck:     Musculoskeletal: Neck supple.  Cardiovascular:     Rate and Rhythm: Regular rhythm. Tachycardia  present.     Heart sounds: No murmur. No friction rub. No gallop.   Pulmonary:     Effort: Pulmonary effort is normal. No respiratory distress.     Breath sounds: No stridor. No wheezing, rhonchi or rales.  Chest:     Chest wall: No tenderness.  Abdominal:     General: There is no distension.     Palpations: Abdomen is soft.  Musculoskeletal:        General: Swelling and tenderness present.     Right lower leg: No edema.     Left lower leg: No edema.     Comments: Left knee as warm to the touch and edematous.  Minimal erythema.  Surgical scars are well-healed.   DP and PT pulses are 2+ and symmetric.  Sensation is intact and equal throughout the bilateral lower extremities.  Good capillary refill of the digits on the left toes.  Decreased strength against resistance secondary to pain.   Skin:    General: Skin is warm.     Findings: No rash.  Neurological:     Mental Status: She is alert.  Psychiatric:        Behavior: Behavior normal.      ED Treatments / Results  Labs (all labs ordered are listed, but only abnormal results are displayed) Labs Reviewed  SURGICAL PCR SCREEN - Abnormal; Notable for the following components:      Result Value   Staphylococcus aureus POSITIVE (*)    All other components within normal limits  CBC - Abnormal; Notable for the following components:   WBC 12.4 (*)    All other components within normal limits  BASIC METABOLIC PANEL - Abnormal; Notable for the following  components:   Potassium 3.4 (*)    Glucose, Bld 133 (*)    All other components within normal limits  SEDIMENTATION RATE - Abnormal; Notable for the following components:   Sed Rate 43 (*)    All other components within normal limits  C-REACTIVE PROTEIN - Abnormal; Notable for the following components:   CRP 34.5 (*)    All other components within normal limits  SYNOVIAL CELL COUNT + DIFF, W/ CRYSTALS - Abnormal; Notable for the following components:   Color, Synovial AMBER (*)    Appearance-Synovial TURBID (*)    Neutrophil, Synovial 88 (*)    Monocyte-Macrophage-Synovial Fluid 10 (*)    All other components within normal limits  SARS CORONAVIRUS 2 (HOSPITAL ORDER, [facility])  AEROBIC/ANAEROBIC CULTURE (SURGICAL/DEEP WOUND)  CULTURE, BLOOD (ROUTINE X 2)  CULTURE, BLOOD (ROUTINE X 2)  HIV ANTIBODY (ROUTINE TESTING W REFLEX)  CREATININE, SERUM    EKG None  Radiology No results found.  Procedures .Critical Care Performed by: Joanne Gavel, PA-C Authorized by: Joanne Gavel, PA-C   Critical care provider statement:    Critical care time (minutes):  45   Critical care time was exclusive of:  Separately billable procedures and treating other patients and teaching time   Critical care was necessary to treat or prevent imminent or life-threatening deterioration of the following conditions: septic joint.   Critical care was time spent personally by me on the following activities:  Ordering and performing treatments and interventions, ordering and review of laboratory studies, ordering and review of radiographic studies, discussions with consultants, evaluation of patient's response to treatment, examination of patient, review of old charts, re-evaluation of patient's condition, pulse oximetry, development of treatment plan with patient or surrogate and  obtaining history from patient or surrogate   I assumed direction of critical care for this patient  from another provider in my specialty: no     (including critical care time)  Medications Ordered in ED Medications  vancomycin (VANCOCIN) 1-5 GM/200ML-% IVPB (has no administration in time range)  scopolamine (TRANSDERM-SCOP) 1 MG/3DAYS (has no administration in time range)  ketorolac (TORADOL) 30 MG/ML injection (has no administration in time range)  HYDROmorphone (DILAUDID) 1 MG/ML injection (has no administration in time range)  methocarbamol (ROBAXIN) 500 MG/50 ML IVPB (has no administration in time range)  HYDROmorphone (DILAUDID) injection 1 mg (1 mg Intravenous Given 04/05/19 2232)  ondansetron (ZOFRAN) injection 4 mg (4 mg Intravenous Given 04/05/19 2232)  vancomycin (VANCOCIN) 2,000 mg in sodium chloride 0.9 % 500 mL IVPB (2,000 mg Intravenous New Bag/Given 04/06/19 0105)  sodium chloride 0.9 % bolus 1,000 mL (0 mLs Intravenous Stopped 04/06/19 0104)  acetaminophen (TYLENOL) tablet 650 mg (650 mg Oral Given 04/06/19 0024)  methocarbamol (ROBAXIN) injection 500 mg (500 mg Intramuscular Given 04/06/19 0024)  HYDROmorphone (DILAUDID) injection 1 mg (1 mg Intravenous Given 04/06/19 0935)  ketorolac (TORADOL) 30 MG/ML injection 30 mg (30 mg Intravenous Given 04/06/19 1552)  lip balm (CARMEX) ointment (1 application  Given 02/04/77 0837)  heparin lock flush 100 unit/mL (250 Units Intracatheter Given 04/09/19 1223)  dexamethasone (DECADRON) 10 MG/ML injection (has no administration in time range)  ondansetron (ZOFRAN) 4 MG/2ML injection (has no administration in time range)  midazolam (VERSED) 2 MG/2ML injection (has no administration in time range)  fentaNYL (SUBLIMAZE) 250 MCG/5ML injection (has no administration in time range)  fentaNYL (SUBLIMAZE) 100 MCG/2ML injection (has no administration in time range)  acetaminophen (OFIRMEV) 10 MG/ML IV (has no administration in time range)  bupivacaine (MARCAINE) 0.25 % (with pres) injection (has no administration in time range)     Initial Impression /  Assessment and Plan / ED Course  I have reviewed the triage vital signs and the nursing notes.  Pertinent labs & imaging results that were available during my care of the patient were reviewed by me and considered in my medical decision making (see chart for details).  Clinical Course as of Apr 14 842  Mon Apr 05, 2019  2350 Patient recheck.  She reports the pain is still severe.  She exclaims "they tell me when I came that I would get a dose of pain medicine, and I would not be hurting anymore.  I should have even come.  I should have just waited until my surgery tomorrow.  Patient reassurance through education.  She is persistently tachycardic and only had an oral temp of 99.6.  Will order Tylenol, Robaxin, and IV fluids.  We will place the patient on a pure wick.  Orders to elevate the extremity and apply an ice pack have also been ordered.  We will also continue to closely monitor the patient's SaO2.   [MM]    Clinical Course User Index [MM] Navon Kotowski A, PA-C       58 year old female with history of obesity presenting from home with left knee warmth and swelling after recently having surgery.  Patient was scheduled for washout with Dr. Theda Sers tomorrow, but was advised to come to the ER for admission and further pain control after her home pain medication was not improving her symptoms.  On arrival, patient with oral temp of 99.6 and tachycardic in the 130s.  She is normotensive without tachypnea or hypoxia.  She was given  1 mg of Dilaudid on arrival, 650 mg of Tylenol, and 1 L of fluids with improvement of her heart rate in the 130s to low 100s.  - Received a message from charge nurse after she spoke with Dr. Theda Sers.  Dr. Theda Sers had advised EDP to speak with Dr. Alvan Dame for admission.   - On re-evaluation, patient reported no improvement in her pain following Dilaudid.  She states " they told me if I came here and I got 1 dose of pain medicine that all my pain would go away.  I do not  even know a cane.  I should have just stayed at home until my surgery tomorrow if I was going to be hurting this much I could have just been at home."  Patient education regarding pain medication and respiratory status was discussed.  IV Robaxin, ice pack, and elevation of the left extremity have also been ordered.  Discussed with the patient and her husband that I would call Dr. Alvan Dame from [facility] for admission. All questions answered.   - Spoke with Dr. Alvan Dame from [facility] who was not aware of the patient.  He requested I put in a temporary admission orders, hold antibiotics, and start pain medication, but while I was on the phone with him, admission orders, labs, and home medications were placed by Dr. Theda Sers.  COVID-19 test has been ordered and is pending. Labs ordered by admitting team are pending at this time.    The patient appears reasonably stabilized for admission considering the current resources, flow, and capabilities available in the ED at this time, and I doubt any other [facility] requiring further screening and/or treatment in the ED prior to admission.  Final Clinical Impressions(s) / ED Diagnoses   Final diagnoses:  Pyogenic arthritis of left knee joint, due to unspecified organism [facility])    ED Discharge Orders         Ordered    Home infusion instructions Alvarado May follow [place] Dosing Protocol; May administer Cathflo as needed to maintain patency of vascular access device.; Flushing of vascular access device: per [facility] Protocol: 0.9% NaCl pre/post medica...     04/09/19 0912    ceFAZolin (ANCEF) IVPB  Every 8 hours     04/09/19 0912    oxyCODONE-acetaminophen (PERCOCET) 5-325 MG tablet  Every 6 hours PRN     04/09/19 0912    celecoxib (CELEBREX) 200 MG capsule  Daily     04/09/19 0912    gabapentin (NEURONTIN) 300 MG capsule  2 times daily     04/09/19 0912    cyclobenzaprine (FLEXERIL) 10 MG tablet  3 times daily PRN     04/09/19 0912    [facility]  rolling     04/09/19 0912    Call MD / Call 911    Comments: If you experience chest pain or shortness of breath, CALL 911 and be transported to the hospital emergency room.  If you develope a fever above 101 F, pus (white drainage) or increased drainage or redness at the wound, or calf pain, call your surgeon's office.   04/09/19 0912    Diet - low sodium heart healthy     04/09/19 0912    Constipation Prevention    Comments: Drink plenty of fluids.  Prune juice may be helpful.  You may use a stool softener, such as Colace (over the counter) 100 mg twice a day.  Use MiraLax (over the counter) for constipation as needed.  04/09/19 0912    Increase activity slowly as tolerated     04/09/19 0912    Diet general     04/09/19 0912           Joline Maxcy A, PA-C 04/06/19 0940    Emika Tiano A, PA-C 04/15/19 8242    Charlesetta Shanks, MD 04/24/19 2217

## 2019-04-05 NOTE — ED Triage Notes (Signed)
Patient is complaining of left knee pain. She recently had surgery on it and is suppose to have another knee surgery tomorrow as scheduled. She was directed to come on for pain management tonight. She reported to EMS she has already taken Hydrocodone x 2 tablets.

## 2019-04-05 NOTE — H&P (Signed)
Rachel Huffman is an 58 y.o. female.    Chief Complaint:  Septic left knee  Procedure planned:Left knee arthroscopic I&D   HPI: Pt is a 58 y.o. female complaining of left knee pain and swelling.  Dr. Thomasena Edisollins performed a left knee arthroscopy with partial medial and lateral meniscectomy and chondroplasty.  Eight days post-op she had no erythema, swelling or warmth. She did have a little clear synovial fluid drainage from her lateral portal for which she was given Keflex.   Medial portals well sealed. She has full extension of the knee with further flexion to 100. No calf tenderness no cords and a negative Homans sign.  Thirteen days post-op she did return to the clinic for a large effusion, but only 8 cc of fluid were obtained. On Saturday 04/03/2019 she started to experience significant pain and swelling of the left knee. She has not been able to stand on her own and is needing help to get up and around. Her knee was also constantly spasming. She had to be brought to the clinic on 04/05/2019 by ambulance.  Various options are discussed with the patient. Risks, benefits and expectations were discussed with the patient. Patient understand the risks, benefits and expectations and wishes to proceed with surgery.    PCP: Madelin HeadingsPanosh, Wanda K, MD  D/C Plans:       Home   Post-op Meds:       No Rx given    PMH: Past Medical History:  Diagnosis Date  . Allergy   . Asthma   . Gallstones   . IBS (irritable bowel syndrome)   . Sinusitis     PSH: Past Surgical History:  Procedure Laterality Date  . KNEE SURGERY Left 07/2016    Social History:  reports that she has never smoked. She has never used smokeless tobacco. She reports current alcohol use. She reports that she does not use drugs.  Allergies:  Allergies  Allergen Reactions  . Clarithromycin     REACTION: diarrhea  . Doxycycline Nausea Only    Medications: Current Facility-Administered Medications  Medication Dose Route Frequency  Provider Last Rate Last Dose  . 0.9 %  sodium chloride infusion   Intravenous Continuous Lanney GinsBabish, Duel Conrad, PA-C      . [START ON 04/06/2019] acetaminophen (TYLENOL) tablet 325-650 mg  325-650 mg Oral Q6H PRN Lanney GinsBabish, Eilidh Marcano, PA-C      . albuterol (VENTOLIN HFA) 108 (90 Base) MCG/ACT inhaler 2 puff  2 puff Inhalation Q6H PRN Modell Fendrick, PA-C      . bisacodyl (DULCOLAX) suppository 10 mg  10 mg Rectal Daily PRN Sydny Schnitzler, PA-C      . cefTRIAXone (ROCEPHIN) 1 g in sodium chloride 0.9 % 100 mL IVPB  1 g Intravenous Q24H Shaynna Husby, PA-C      . diphenhydrAMINE (BENADRYL) 12.5 MG/5ML elixir 12.5-25 mg  12.5-25 mg Oral Q4H PRN Alonie Gazzola, PA-C      . docusate sodium (COLACE) capsule 100 mg  100 mg Oral BID Lanney GinsBabish, Devlon Dosher, PA-C      . HYDROcodone-acetaminophen (NORCO) 7.5-325 MG per tablet 1-2 tablet  1-2 tablet Oral Q4H PRN Lanney GinsBabish, Bali Lyn, PA-C      . HYDROcodone-acetaminophen (NORCO/VICODIN) 5-325 MG per tablet 1-2 tablet  1-2 tablet Oral Q4H PRN Lanney GinsBabish, Bishop Vanderwerf, PA-C      . HYDROmorphone (DILAUDID) injection 0.5-1 mg  0.5-1 mg Intravenous Q2H PRN Lanney GinsBabish, Vedder Brittian, PA-C      . magnesium citrate solution 1 Bottle  1  Bottle Oral Once PRN Danae Orleans, PA-C      . methocarbamol (ROBAXIN) tablet 500 mg  500 mg Oral Q6H PRN Danae Orleans, PA-C       Or  . methocarbamol (ROBAXIN) 500 mg in dextrose 5 % 50 mL IVPB  500 mg Intravenous Q6H PRN Jackqueline Aquilar, PA-C      . ondansetron (ZOFRAN) tablet 4 mg  4 mg Oral Q6H PRN Danae Orleans, PA-C       Or  . ondansetron (ZOFRAN) injection 4 mg  4 mg Intravenous Q6H PRN Danae Orleans, PA-C      . [START ON 04/06/2019] pantoprazole (PROTONIX) EC tablet 40 mg  40 mg Oral Daily Lurae Hornbrook, PA-C      . polyethylene glycol (MIRALAX / GLYCOLAX) packet 17 g  17 g Oral Daily PRN Danae Orleans, PA-C      . [START ON 04/06/2019] traMADol (ULTRAM) tablet 50 mg  50 mg Oral Q6H Maryjayne Kleven, PA-C      . traMADol Veatrice Bourbon) tablet 50 mg  50 mg  Oral QHS PRN Danae Orleans, PA-C       Current Outpatient Medications  Medication Sig Dispense Refill  . albuterol (PROVENTIL HFA;VENTOLIN HFA) 108 (90 Base) MCG/ACT inhaler Inhale 2 puffs into the lungs every 6 (six) hours as needed for wheezing or shortness of breath. 1 Inhaler 0  . cephALEXin (KEFLEX) 500 MG capsule Take 500 mg by mouth 4 (four) times daily.    Marland Kitchen HYDROcodone-acetaminophen (NORCO/VICODIN) 5-325 MG tablet Take 1 tablet by mouth every 8 (eight) hours as needed.    . pantoprazole (PROTONIX) 40 MG tablet TAKE 1 TABLET BY MOUTH DAILY ABOUT 30 TO 60 MINUTES BEFORE SUPPER (Patient taking differently: Take 40 mg by mouth daily. ) 90 tablet 1       Review of Systems  Constitutional: Negative.   HENT: Negative.   Eyes: Negative.   Respiratory: Negative.   Cardiovascular: Negative.   Gastrointestinal: Positive for heartburn.  Genitourinary: Negative.   Musculoskeletal: Positive for joint pain.  Skin: Negative.   Neurological: Negative.   Endo/Heme/Allergies: Positive for environmental allergies.  Psychiatric/Behavioral: Negative.        Physical Exam  Constitutional: She is oriented to person, place, and time. She appears well-developed.  HENT:  Head: Normocephalic.  Eyes: Pupils are equal, round, and reactive to light.  Neck: Neck supple. No JVD present. No tracheal deviation present. No thyromegaly present.  Cardiovascular: Normal rate, regular rhythm and intact distal pulses.  Respiratory: Effort normal and breath sounds normal. No respiratory distress. She has no wheezes.  GI: Soft. There is no abdominal tenderness. There is no guarding.  Musculoskeletal:     Left knee: She exhibits decreased range of motion, swelling and effusion. She exhibits no ecchymosis, no deformity and no laceration (previous surgical incisions). Tenderness found.  Lymphadenopathy:    She has no cervical adenopathy.  Neurological: She is alert and oriented to person, place, and time.   Skin: Skin is warm and dry.  Psychiatric: She has a normal mood and affect.       Assessment/Plan Assessment: Septic left knee   Plan: Patient will undergo a left knee arthroscopic I&D on 04/06/2019 per Dr. Theda Sers at [facility]. Risks benefits and expectations were discussed with the patient. Patient understand risks, benefits and expectations and wishes to proceed.     [place] Pugh Aamira Bischoff   PA-C  04/05/2019, 11:12 PM

## 2019-04-05 NOTE — Progress Notes (Signed)
Pharmacy Antibiotic Note  Rachel Huffman is a 58 y.o. female admitted on 04/05/2019 with septic left knee.  Pharmacy has been consulted for vancomycin dosing.  Plan: Vancomycin 2 Gm x1 then 750 mg IV q12h for est AUC = 522 Goal AUC = 400-550 Use Vd = 0.5, Scr = 0.84 F/u scr/cultures/levels  Height: 5\' 7"  (170.2 cm) Weight: 200 lb (90.7 kg) IBW/kg (Calculated) : 61.6  Temp (24hrs), Avg:99.6 F (37.6 C), Min:99.6 F (37.6 C), Max:99.6 F (37.6 C)  No results for input(s): WBC, CREATININE, LATICACIDVEN, VANCOTROUGH, VANCOPEAK, VANCORANDOM, GENTTROUGH, GENTPEAK, GENTRANDOM, TOBRATROUGH, TOBRAPEAK, TOBRARND, AMIKACINPEAK, AMIKACINTROU, AMIKACIN in the last 168 hours.  CrCl cannot be calculated (Patient's most recent lab result is older than the maximum 21 days allowed.).    Allergies  Allergen Reactions  . Clarithromycin     REACTION: diarrhea  . Doxycycline Nausea Only    Antimicrobials this admission: 8/3 vancomycin >>  8/3 rocephin >>   Dose adjustments this admission:   Microbiology results:  BCx:   UCx:    Sputum:    MRSA PCR:   Thank you for allowing pharmacy to be a part of this patient's care.  Dorrene German 04/05/2019 11:19 PM

## 2019-04-06 ENCOUNTER — Encounter (HOSPITAL_COMMUNITY): Payer: Self-pay | Admitting: Registered Nurse

## 2019-04-06 ENCOUNTER — Inpatient Hospital Stay: Admit: 2019-04-06 | Payer: 59 | Admitting: Specialist

## 2019-04-06 ENCOUNTER — Inpatient Hospital Stay (HOSPITAL_COMMUNITY): Payer: 59 | Admitting: Anesthesiology

## 2019-04-06 ENCOUNTER — Other Ambulatory Visit: Payer: Self-pay

## 2019-04-06 ENCOUNTER — Encounter (HOSPITAL_COMMUNITY)
Admission: EM | Disposition: A | Discharge status: Home Health | Payer: Self-pay | Source: Home / Self Care | Attending: Specialist

## 2019-04-06 DIAGNOSIS — Z978 Presence of other specified devices: Secondary | ICD-10-CM

## 2019-04-06 DIAGNOSIS — B9561 Methicillin susceptible Staphylococcus aureus infection as the cause of diseases classified elsewhere: Secondary | ICD-10-CM

## 2019-04-06 DIAGNOSIS — M00062 Staphylococcal arthritis, left knee: Principal | ICD-10-CM

## 2019-04-06 DIAGNOSIS — Z881 Allergy status to other antibiotic agents status: Secondary | ICD-10-CM

## 2019-04-06 HISTORY — PX: KNEE ARTHROSCOPY: SHX127

## 2019-04-06 LAB — CBC
HCT: 44.1 % (ref 36.0–46.0)
Hemoglobin: 14.4 g/dL (ref 12.0–15.0)
MCH: 29.5 pg (ref 26.0–34.0)
MCHC: 32.7 g/dL (ref 30.0–36.0)
MCV: 90.4 fL (ref 80.0–100.0)
Platelets: 275 10*3/uL (ref 150–400)
RBC: 4.88 MIL/uL (ref 3.87–5.11)
RDW: 12.9 % (ref 11.5–15.5)
WBC: 12.4 10*3/uL — ABNORMAL HIGH (ref 4.0–10.5)
nRBC: 0 % (ref 0.0–0.2)

## 2019-04-06 LAB — BASIC METABOLIC PANEL
Anion gap: 10 (ref 5–15)
BUN: 13 mg/dL (ref 6–20)
CO2: 22 mmol/L (ref 22–32)
Calcium: 9 mg/dL (ref 8.9–10.3)
Chloride: 105 mmol/L (ref 98–111)
Creatinine, Ser: 0.84 mg/dL (ref 0.44–1.00)
GFR calc Af Amer: >60 mL/min (ref >60)
GFR calc non Af Amer: >60 mL/min (ref >60)
Glucose, Bld: 133 mg/dL — ABNORMAL HIGH (ref 70–99)
Potassium: 3.4 mmol/L — ABNORMAL LOW (ref 3.5–5.1)
Sodium: 137 mmol/L (ref 135–145)

## 2019-04-06 LAB — SEDIMENTATION RATE: Sed Rate: 43 mm/hr — ABNORMAL HIGH (ref 0–22)

## 2019-04-06 LAB — SARS CORONAVIRUS 2 BY RT PCR (HOSPITAL ORDER, PERFORMED IN ~~LOC~~ HOSPITAL LAB): SARS Coronavirus 2: NEGATIVE

## 2019-04-06 LAB — SYNOVIAL CELL COUNT + DIFF, W/ CRYSTALS
Crystals, Fluid: NONE SEEN
Eosinophils-Synovial: 0 % (ref 0–1)
Lymphocytes-Synovial Fld: 2 % (ref 0–20)
Monocyte-Macrophage-Synovial Fluid: 10 % — ABNORMAL LOW (ref 50–90)
Neutrophil, Synovial: 88 % — ABNORMAL HIGH (ref 0–25)
WBC, Synovial: UNDETERMINED /mm3 (ref 0–200)

## 2019-04-06 LAB — SURGICAL PCR SCREEN
MRSA, PCR: NEGATIVE
Staphylococcus aureus: POSITIVE — AB

## 2019-04-06 LAB — C-REACTIVE PROTEIN: CRP: 34.5 mg/dL — ABNORMAL HIGH (ref <1.0)

## 2019-04-06 LAB — HIV ANTIBODY (ROUTINE TESTING W REFLEX): HIV Screen 4th Generation wRfx: NONREACTIVE

## 2019-04-06 SURGERY — ARTHROSCOPY, KNEE
Anesthesia: General | Laterality: Left

## 2019-04-06 MED ORDER — PHENYLEPHRINE HCL (PRESSORS) 10 MG/ML IV SOLN
INTRAVENOUS | Status: DC | PRN
  Administered 2019-04-06 (×2): 60 ug via INTRAVENOUS

## 2019-04-06 MED ORDER — SCOPOLAMINE 1 MG/3DAYS TD PT72
MEDICATED_PATCH | TRANSDERMAL | Status: DC | PRN
  Administered 2019-04-06: 1 via TRANSDERMAL

## 2019-04-06 MED ORDER — FENTANYL CITRATE (PF) 250 MCG/5ML IJ SOLN
INTRAMUSCULAR | Status: AC
  Filled 2019-04-06: qty 5

## 2019-04-06 MED ORDER — PROPOFOL 10 MG/ML IV BOLUS
INTRAVENOUS | Status: DC | PRN
  Administered 2019-04-06: 220 mg via INTRAVENOUS

## 2019-04-06 MED ORDER — BUPIVACAINE HCL 0.25 % IJ SOLN
INTRAMUSCULAR | Status: AC
  Filled 2019-04-06: qty 1

## 2019-04-06 MED ORDER — ONDANSETRON HCL 4 MG/2ML IJ SOLN: 4.0000 mg | Freq: Four times a day (QID) | INTRAMUSCULAR | Status: DC | PRN

## 2019-04-06 MED ORDER — ACETAMINOPHEN 10 MG/ML IV SOLN
INTRAVENOUS | Status: AC
  Filled 2019-04-06: qty 100

## 2019-04-06 MED ORDER — HYDROMORPHONE HCL 1 MG/ML IJ SOLN
1.0000 mg | Freq: Once | INTRAMUSCULAR | Status: AC
  Administered 2019-04-06: 10:00:00 1 mg via INTRAVENOUS
  Filled 2019-04-06: qty 1

## 2019-04-06 MED ORDER — METHOCARBAMOL 500 MG IVPB - SIMPLE MED
INTRAVENOUS | Status: AC
  Filled 2019-04-06: qty 50

## 2019-04-06 MED ORDER — DEXAMETHASONE SODIUM PHOSPHATE 10 MG/ML IJ SOLN
INTRAMUSCULAR | Status: AC
  Filled 2019-04-06: qty 1

## 2019-04-06 MED ORDER — HYDROMORPHONE HCL 1 MG/ML IJ SOLN
1.0000 mg | INTRAMUSCULAR | Status: DC | PRN
  Administered 2019-04-08 – 2019-04-09 (×5): 1 mg via INTRAVENOUS
  Filled 2019-04-06 (×6): qty 1

## 2019-04-06 MED ORDER — MEPERIDINE HCL 50 MG/ML IJ SOLN: 6.2500 mg | INTRAMUSCULAR | Status: DC | PRN

## 2019-04-06 MED ORDER — OXYCODONE HCL 5 MG PO TABS
5.0000 mg | ORAL_TABLET | ORAL | Status: DC | PRN
  Administered 2019-04-06 (×2): 5 mg via ORAL
  Administered 2019-04-07 – 2019-04-09 (×14): 10 mg via ORAL
  Filled 2019-04-06 (×15): qty 2

## 2019-04-06 MED ORDER — VANCOMYCIN HCL 1000 MG IV SOLR
1000.0000 mg | Freq: Two times a day (BID) | INTRAVENOUS | Status: DC
  Administered 2019-04-06: 1000 mg via INTRAVENOUS
  Filled 2019-04-06 (×4): qty 1000

## 2019-04-06 MED ORDER — KETOROLAC TROMETHAMINE 30 MG/ML IJ SOLN
30.0000 mg | Freq: Once | INTRAMUSCULAR | Status: AC | PRN
  Administered 2019-04-06: 30 mg via INTRAVENOUS

## 2019-04-06 MED ORDER — VANCOMYCIN HCL IN DEXTROSE 1-5 GM/200ML-% IV SOLN
INTRAVENOUS | Status: AC
  Filled 2019-04-06: qty 200

## 2019-04-06 MED ORDER — KETOROLAC TROMETHAMINE 30 MG/ML IJ SOLN
INTRAMUSCULAR | Status: AC
  Filled 2019-04-06: qty 1

## 2019-04-06 MED ORDER — BUPIVACAINE HCL 0.25 % IJ SOLN
INTRAMUSCULAR | Status: DC | PRN
  Administered 2019-04-06: 20 mL

## 2019-04-06 MED ORDER — METHOCARBAMOL 1000 MG/10ML IJ SOLN
750.0000 mg | Freq: Four times a day (QID) | INTRAVENOUS | Status: DC | PRN
  Filled 2019-04-06: qty 7.5

## 2019-04-06 MED ORDER — ONDANSETRON HCL 4 MG/2ML IJ SOLN
INTRAMUSCULAR | Status: DC | PRN
  Administered 2019-04-06: 4 mg via INTRAVENOUS

## 2019-04-06 MED ORDER — HYDROMORPHONE HCL 1 MG/ML IJ SOLN
INTRAMUSCULAR | Status: AC
  Filled 2019-04-06: qty 2

## 2019-04-06 MED ORDER — LACTATED RINGERS IV SOLN
INTRAVENOUS | Status: DC | PRN
  Administered 2019-04-06 (×2): via INTRAVENOUS

## 2019-04-06 MED ORDER — SODIUM CHLORIDE 0.9 % IV SOLN
2.0000 g | INTRAVENOUS | Status: DC
  Filled 2019-04-06: qty 20

## 2019-04-06 MED ORDER — PROMETHAZINE HCL 25 MG/ML IJ SOLN: 6.2500 mg | INTRAMUSCULAR | Status: DC | PRN

## 2019-04-06 MED ORDER — DOCUSATE SODIUM 100 MG PO CAPS
100.0000 mg | ORAL_CAPSULE | Freq: Two times a day (BID) | ORAL | Status: DC
  Administered 2019-04-06 – 2019-04-09 (×6): 100 mg via ORAL
  Filled 2019-04-06 (×6): qty 1

## 2019-04-06 MED ORDER — HYDROMORPHONE HCL 1 MG/ML IJ SOLN
0.2500 mg | INTRAMUSCULAR | Status: DC | PRN
  Administered 2019-04-06 (×4): 0.5 mg via INTRAVENOUS

## 2019-04-06 MED ORDER — LACTATED RINGERS IR SOLN
Status: DC | PRN
  Administered 2019-04-06: 3000 mL
  Administered 2019-04-06: 6000 mL

## 2019-04-06 MED ORDER — MIDAZOLAM HCL 2 MG/2ML IJ SOLN
INTRAMUSCULAR | Status: AC
  Filled 2019-04-06: qty 2

## 2019-04-06 MED ORDER — METOCLOPRAMIDE HCL 5 MG PO TABS: 5.0000 mg | ORAL_TABLET | Freq: Three times a day (TID) | ORAL | Status: DC | PRN

## 2019-04-06 MED ORDER — LIDOCAINE HCL (CARDIAC) PF 50 MG/5ML IV SOSY
PREFILLED_SYRINGE | INTRAVENOUS | Status: DC | PRN
  Administered 2019-04-06: 100 mg via INTRAVENOUS

## 2019-04-06 MED ORDER — METHOCARBAMOL 500 MG PO TABS
750.0000 mg | ORAL_TABLET | Freq: Four times a day (QID) | ORAL | Status: DC | PRN
  Administered 2019-04-07 – 2019-04-09 (×8): 750 mg via ORAL
  Filled 2019-04-06 (×11): qty 2

## 2019-04-06 MED ORDER — ONDANSETRON HCL 4 MG PO TABS: 4.0000 mg | ORAL_TABLET | Freq: Four times a day (QID) | ORAL | Status: DC | PRN

## 2019-04-06 MED ORDER — ONDANSETRON HCL 4 MG/2ML IJ SOLN
INTRAMUSCULAR | Status: AC
  Filled 2019-04-06: qty 2

## 2019-04-06 MED ORDER — VANCOMYCIN HCL IN DEXTROSE 750-5 MG/150ML-% IV SOLN
750.0000 mg | Freq: Two times a day (BID) | INTRAVENOUS | Status: DC
  Administered 2019-04-07 – 2019-04-08 (×4): 750 mg via INTRAVENOUS
  Filled 2019-04-06 (×5): qty 150

## 2019-04-06 MED ORDER — FENTANYL CITRATE (PF) 100 MCG/2ML IJ SOLN
INTRAMUSCULAR | Status: DC | PRN
  Administered 2019-04-06: 50 ug via INTRAVENOUS
  Administered 2019-04-06 (×2): 25 ug via INTRAVENOUS
  Administered 2019-04-06 (×4): 50 ug via INTRAVENOUS

## 2019-04-06 MED ORDER — FENTANYL CITRATE (PF) 100 MCG/2ML IJ SOLN
INTRAMUSCULAR | Status: AC
  Filled 2019-04-06: qty 2

## 2019-04-06 MED ORDER — LACTATED RINGERS IV SOLN: INTRAVENOUS | Status: DC

## 2019-04-06 MED ORDER — MIDAZOLAM HCL 5 MG/5ML IJ SOLN
INTRAMUSCULAR | Status: DC | PRN
  Administered 2019-04-06: 2 mg via INTRAVENOUS

## 2019-04-06 MED ORDER — SCOPOLAMINE 1 MG/3DAYS TD PT72
MEDICATED_PATCH | TRANSDERMAL | Status: AC
  Filled 2019-04-06: qty 1

## 2019-04-06 MED ORDER — ACETAMINOPHEN 325 MG PO TABS
650.0000 mg | ORAL_TABLET | Freq: Four times a day (QID) | ORAL | Status: DC
  Administered 2019-04-06 – 2019-04-09 (×12): 650 mg via ORAL
  Filled 2019-04-06 (×12): qty 2

## 2019-04-06 MED ORDER — ACETAMINOPHEN 10 MG/ML IV SOLN
INTRAVENOUS | Status: DC | PRN
  Administered 2019-04-06: 1000 mg via INTRAVENOUS

## 2019-04-06 MED ORDER — METOCLOPRAMIDE HCL 5 MG/ML IJ SOLN: 5.0000 mg | Freq: Three times a day (TID) | INTRAMUSCULAR | Status: DC | PRN

## 2019-04-06 SURGICAL SUPPLY — 41 items
BLADE EXCALIBUR 5.0MM X 13CM (MISCELLANEOUS) ×1
BLADE EXCALIBUR 5.0X13 (MISCELLANEOUS) ×2 IMPLANT
BLADE GREAT WHITE 4.2 (BLADE) IMPLANT
BLADE GREAT WHITE 4.2MM (BLADE)
BNDG ELASTIC 6X5.8 VLCR STR LF (GAUZE/BANDAGES/DRESSINGS) ×6 IMPLANT
BNDG GAUZE ELAST 4 BULKY (GAUZE/BANDAGES/DRESSINGS) ×12 IMPLANT
CONT SPEC 4OZ CLIKSEAL STRL BL (MISCELLANEOUS) ×3 IMPLANT
COVER SURGICAL LIGHT HANDLE (MISCELLANEOUS) ×3 IMPLANT
COVER WAND RF STERILE (DRAPES) IMPLANT
CUFF TOURN SGL QUICK 34 (TOURNIQUET CUFF)
CUFF TRNQT CYL 34X4.125X (TOURNIQUET CUFF) IMPLANT
DECANTER SPIKE VIAL GLASS SM (MISCELLANEOUS) IMPLANT
DRAPE INCISE IOBAN 66X45 STRL (DRAPES) ×3 IMPLANT
DRSG PAD ABDOMINAL 8X10 ST (GAUZE/BANDAGES/DRESSINGS) ×6 IMPLANT
DURAPREP 26ML APPLICATOR (WOUND CARE) ×3 IMPLANT
DW OUTFLOW CASSETTE/TUBE SET (MISCELLANEOUS) ×3 IMPLANT
EVACUATOR 1/8 PVC DRAIN (DRAIN) ×3 IMPLANT
GAUZE SPONGE 4X4 12PLY STRL (GAUZE/BANDAGES/DRESSINGS) ×3 IMPLANT
GAUZE XEROFORM 1X8 LF (GAUZE/BANDAGES/DRESSINGS) ×3 IMPLANT
GLOVE BIOGEL PI IND STRL 8 (GLOVE) ×2 IMPLANT
GLOVE BIOGEL PI INDICATOR 8 (GLOVE) ×4
GLOVE SURG ORTHO 8.0 STRL STRW (GLOVE) ×3 IMPLANT
GLOVE SURG SS PI 7.5 STRL IVOR (GLOVE) ×3 IMPLANT
GOWN STRL REUS W/TWL XL LVL3 (GOWN DISPOSABLE) ×6 IMPLANT
HOLDER FOLEY CATH W/STRAP (MISCELLANEOUS) ×3 IMPLANT
KIT BASIN OR (CUSTOM PROCEDURE TRAY) ×3 IMPLANT
KIT TURNOVER KIT A (KITS) IMPLANT
MANIFOLD NEPTUNE II (INSTRUMENTS) ×3 IMPLANT
MINI VAC (SURGICAL WAND) IMPLANT
NEEDLE HYPO 22GX1.5 SAFETY (NEEDLE) ×3 IMPLANT
PACK ARTHROSCOPY WL (CUSTOM PROCEDURE TRAY) ×3 IMPLANT
PAD MASON LEG HOLDER (PIN) ×3 IMPLANT
PROBE BIPOLAR 50 DEGREE SUCT (MISCELLANEOUS) IMPLANT
SLEEVE SCD COMPRESS KNEE MED (MISCELLANEOUS) ×3 IMPLANT
SUT ETHILON 4 0 PS 2 18 (SUTURE) ×6 IMPLANT
SYR CONTROL 10ML LL (SYRINGE) ×3 IMPLANT
TOWEL OR 17X26 10 PK STRL BLUE (TOWEL DISPOSABLE) ×3 IMPLANT
TRAY FOLEY MTR SLVR 14FR STAT (SET/KITS/TRAYS/PACK) ×3 IMPLANT
TUBING ARTHRO INFLOW-ONLY STRL (TUBING) IMPLANT
TUBING ARTHROSCOPY IRRIG 16FT (MISCELLANEOUS) ×3 IMPLANT
WRAP KNEE MAXI GEL POST OP (GAUZE/BANDAGES/DRESSINGS) IMPLANT

## 2019-04-06 NOTE — Anesthesia Preprocedure Evaluation (Signed)
Anesthesia Evaluation  Patient identified by MRN, date of birth, ID band Patient awake    Reviewed: Allergy & Precautions, NPO status , Patient's Chart, lab work & pertinent test results  Airway Mallampati: I       Dental no notable dental hx. (+) Teeth Intact   Pulmonary asthma ,    Pulmonary exam normal breath sounds clear to auscultation       Cardiovascular negative cardio ROS Normal cardiovascular exam Rhythm:Regular Rate:Normal     Neuro/Psych negative neurological ROS  negative psych ROS   GI/Hepatic Neg liver ROS, GERD  Medicated and Controlled,  Endo/Other    Renal/GU negative Renal ROS  negative genitourinary   Musculoskeletal  (+) Arthritis , Osteoarthritis,    Abdominal (+) + obese,   Peds  Hematology negative hematology ROS (+)   Anesthesia Other Findings   Reproductive/Obstetrics                             Anesthesia Physical Anesthesia Plan  ASA: II  Anesthesia Plan: General   Post-op Pain Management:    Induction:   PONV Risk Score and Plan: 4 or greater and Ondansetron, Dexamethasone and Midazolam  Airway Management Planned: LMA  Additional Equipment: None  Intra-op Plan:   Post-operative Plan: Extubation in OR  Informed Consent: I have reviewed the patients History and Physical, chart, labs and discussed the procedure including the risks, benefits and alternatives for the proposed anesthesia with the patient or authorized representative who has indicated his/her understanding and acceptance.     Dental advisory given  Plan Discussed with:   Anesthesia Plan Comments:         Anesthesia Quick Evaluation

## 2019-04-06 NOTE — Op Note (Signed)
Dictated number Preop diagnosis left knee septic arthritis Postoperative diagnosis same Procedure left knee arthroscopic irrigation and debridement with intraoperative synovial fluid analysis. Surgeon Hart Robinsons, MD Assistant Ronald Lobo PA-C Anesthesia with intraoperative knee block Drains 1 medium Hemovac Tourniquet time none Complications none Disposition PACU stable Surgical findings thick yellow turbid fluid was removed from the knee sent for stat Gram stain culture to follow his cell count differential and crystals arthroscopic evaluation revealed areas of fibrinous exudate but no apparent extra-articular cartilage damage.  Procedure patient counseling holding area correct side identified marked signed appropriately.  Take the operating room placed in supine position general anesthesia requested Foley catheter was placed viewed by the OR circulating nurse.  Of suture all sutures were removed from left knee.  Left posterior elevated prepped with DuraPrep and draped in sterile fashion timeout done confirm the left side possible lateral portal was established and a cup of fluid was sent for pathologic review.  Large inflow cannula placed through here partial placement inferolateral anoscope inferomedial to this point time she underwent copious irrigation of the knee with multiple liters of lactated Ringer's the shaver was utilized to remove fibrinous exudate.  Following 9 L of irrigation and medial Hemovac drain was placed the proximal lateral portal site sutured into place other 2 portals were closed.  I also note at the beginning procedure she had initially been drained out of the inferolateral portal site I excised the old portal in its entirety.  Also removed the type of fat necrosis fat necrosis from the area.  25 cc of 0.25% Marcaine was injected into the portal sites knee joint for postoperative pain sterile dressing was applied transferred to suction she is awakened taken from the  operating room to PACU stable condition.  Should be stabilized in PACU and admitted continue admission PICC line IV antibiotics infectious disease consult.  Patient's husband was contacted via telephone postoperatively.  No complications or problems.

## 2019-04-06 NOTE — ED Notes (Signed)
Patient having difficulty urinating. Hasn't urinated in 8 hours. Order for cath verbal Dr. Stark Jock.

## 2019-04-06 NOTE — Anesthesia Procedure Notes (Signed)
Procedure Name: LMA Insertion Date/Time: 04/06/2019 2:20 PM Performed by: Lissa Morales, CRNA Pre-anesthesia Checklist: Patient identified, Emergency Drugs available, Suction available and Patient being monitored Patient Re-evaluated:Patient Re-evaluated prior to induction Oxygen Delivery Method: Circle system utilized Preoxygenation: Pre-oxygenation with 100% oxygen Induction Type: IV induction LMA: LMA with gastric port inserted LMA Size: 4.0 Tube type: Oral Number of attempts: 1 Airway Equipment and Method: Oral airway Placement Confirmation: positive ETCO2 Tube secured with: Tape Dental Injury: Teeth and Oropharynx as per pre-operative assessment

## 2019-04-06 NOTE — ED Notes (Signed)
Pt husband at bedside

## 2019-04-06 NOTE — Consult Note (Signed)
[facility]  Total days of antibiotics 1               Reason for Consult: left knee septic arthritis    Referring Physician: collins  Active Problems:   Septic joint of left knee joint (HCC)    HPI: Rachel Huffman is a 58 y.o. female with acute left knee swelling and pain who underwent left knee arthroscopy and partial medial-lateral meniscectomy and chondroplasty. Roughly a week after procedure started to have synovial fluid drainage from lateral portal. She was given cephalexin. On follow up, still had large effusion and underwent aspiration with count in 55K, gram stain showing gpc. She was admitted via the clinic due to worsening pain, swelling, and chills/fever on 8/3. She was started on ceftriaxone and vancomycin. She underwent I x D. OP note reports thick yellow turbid fluid in the joint. Admit wbc of 12.4. she is MSSA colonized.   Past Medical History:  Diagnosis Date  . Allergy   . Asthma   . Gallstones   . IBS (irritable bowel syndrome)   . Sinusitis     Allergies:  Allergies  Allergen Reactions  . Clarithromycin     REACTION: diarrhea  . Doxycycline Nausea Only    Current antibiotics:   MEDICATIONS: . docusate sodium  100 mg Oral BID  . HYDROmorphone      . ketorolac      . [MAR Hold] pantoprazole  40 mg Oral Daily  . scopolamine      . traMADol  50 mg Oral Q6H    Social History   Tobacco Use  . Smoking status: Never Smoker  . Smokeless tobacco: Never Used  Substance Use Topics  . Alcohol use: Yes    Comment: ocassionally  . Drug use: No    Family History  Problem Relation Age of Onset  . Hyperlipidemia Mother        HT  . Heart disease Father        stents died of cva     Review of Systems - Left knee pain, and subjective fevers. Otherwise 12 point ros is negative OBJECTIVE: Temp:  [98.2 F (36.8 C)-99.6 F (37.6 C)] 99 F (37.2 C) (08/04 1545) Pulse Rate:  [106-136] 118 (08/04 1224) Resp:  [11-20] 16  (08/04 1224) BP: (125-159)/(73-97) 151/79 (08/04 1224) SpO2:  [88 %-97 %] 94 % (08/04 1224) Weight:  [90.7 kg-95.3 kg] 95.3 kg (08/04 1103) Physical Exam  Constitutional:  oriented to person, place, and time. appears well-developed and well-nourished. No distress.  HENT: [place]/AT, PERRLA, no scleral icterus Mouth/Throat: Oropharynx is clear and moist. No oropharyngeal exudate.  Cardiovascular: Normal rate, regular rhythm and normal heart sounds. Exam reveals no gallop and no friction rub.  No murmur heard.  Pulmonary/Chest: Effort normal and breath sounds normal. No respiratory distress.  has no wheezes.  Neck = supple, no nuchal rigidity Abdominal: Soft. Bowel sounds are normal.  exhibits no distension. There is no tenderness.  Lymphadenopathy: no cervical adenopathy. No axillary adenopathy Ext: right knee is wrapped with accordian drain in place Neurological: alert and oriented to person, place, and time.  Skin: Skin is warm and dry. No rash noted. No erythema.  Psychiatric: a normal mood and affect.  behavior is normal.    LABS: Results for orders placed or performed during the hospital encounter of 04/05/19 (from the past 48 hour(s))  SARS Coronavirus 2 [facility](Hospital order, Performed in [facility] hospital lab)  Status: None   Collection Time: 04/05/19  9:52 PM  Result Value Ref Range   SARS Coronavirus 2 NEGATIVE NEGATIVE    Comment: (NOTE) If result is NEGATIVE SARS-CoV-2 target nucleic acids are NOT DETECTED. The SARS-CoV-2 RNA is generally detectable in upper and lower  respiratory specimens during the acute phase of infection. The lowest  concentration of SARS-CoV-2 viral copies this assay can detect is 250  copies / mL. A negative result does not preclude SARS-CoV-2 infection  and should not be used as the sole basis for treatment or other  patient management decisions.  A negative result may occur with  improper specimen collection / handling, submission of specimen other   than nasopharyngeal swab, presence of viral mutation(s) within the  areas targeted by this assay, and inadequate number of viral copies  (<250 copies / mL). A negative result must be combined with clinical  observations, patient history, and epidemiological information. If result is POSITIVE SARS-CoV-2 target nucleic acids are DETECTED. The SARS-CoV-2 RNA is generally detectable in upper and lower  respiratory specimens dur ing the acute phase of infection.  Positive  results are indicative of active infection with SARS-CoV-2.  Clinical  correlation with patient history and other diagnostic information is  necessary to determine patient infection status.  Positive results do  not rule out bacterial infection or co-infection with other viruses. If result is PRESUMPTIVE POSTIVE SARS-CoV-2 nucleic acids MAY BE PRESENT.   A presumptive positive result was obtained on the submitted specimen  and confirmed on repeat testing.  While 2019 novel coronavirus  (SARS-CoV-2) nucleic acids may be present in the submitted sample  additional confirmatory testing may be necessary for epidemiological  and / or clinical management purposes  to differentiate between  SARS-CoV-2 and other Sarbecovirus currently known to infect humans.  If clinically indicated additional testing with an alternate test  methodology [phone]) is advised. The SARS-CoV-2 RNA is generally  detectable in upper and lower respiratory sp ecimens during the acute  phase of infection. The expected result is Negative. Fact Sheet for Patients:  StrictlyIdeas.no Fact Sheet for Healthcare Providers: BankingDealers.co.za This test is not yet approved or cleared by the [place] FDA and has been authorized for detection and/or diagnosis of SARS-CoV-2 by FDA under an Emergency Use Authorization (EUA).  This EUA will remain in effect (meaning this test can be used) for the duration of the  COVID-19 declaration under Section 564(b)(1) of the Act, 21 U.S.C. section 360bbb-3(b)(1), unless the authorization is terminated or revoked sooner. Performed at [facility], [place] [address] [place], [place] [address]   HIV antibody (Routine Testing)     Status: None   Collection Time: 04/05/19 11:38 PM  Result Value Ref Range   HIV Screen 4th Generation wRfx Non Reactive Non Reactive    Comment: (NOTE) Performed At: [facility] [place], [place] [id number] Rush Farmer MD BH:[id number]   CBC     Status: Abnormal   Collection Time: 04/05/19 11:38 PM  Result Value Ref Range   WBC 12.4 (H) 4.0 - 10.5 K/uL   RBC 4.88 3.87 - 5.11 MIL/uL   Hemoglobin 14.4 12.0 - 15.0 g/dL   HCT 44.1 36.0 - 46.0 %   MCV 90.4 80.0 - 100.0 fL   MCH 29.5 26.0 - 34.0 pg   MCHC 32.7 30.0 - 36.0 g/dL   RDW 12.9 11.5 - 15.5 %   Platelets 275 150 - 400 K/uL   nRBC 0.0  0.0 - 0.2 %    Comment: Performed at [facility], [address] [place], [place] [address]  Basic metabolic panel     Status: Abnormal   Collection Time: 04/05/19 11:38 PM  Result Value Ref Range   Sodium 137 135 - 145 mmol/L   Potassium 3.4 (L) 3.5 - 5.1 mmol/L   Chloride 105 98 - 111 mmol/L   CO2 22 22 - 32 mmol/L   Glucose, Bld 133 (H) 70 - 99 mg/dL   BUN 13 6 - 20 mg/dL   Creatinine, Ser 1.910.84 0.44 - 1.00 mg/dL   Calcium 9.0 8.9 - 47.810.3 mg/dL   GFR calc non Af Amer >60 >60 mL/min   GFR calc Af Amer >60 >60 mL/min   Anion gap 10 5 - 15    Comment: Performed at [facility], [address] [place], [place] [address]  Sedimentation rate     Status: Abnormal   Collection Time: 04/05/19 11:38 PM  Result Value Ref Range   Sed Rate 43 (H) 0 - 22 mm/hr    Comment: Performed at [facility], [address] [place], [place] [address]  C-reactive protein     Status: Abnormal   Collection Time: 04/05/19 11:38 PM  Result Value Ref Range    CRP 34.5 (H) <1.0 mg/dL    Comment: Performed at [facility], [address] [place], [place] [address]    MICRO: pending IMAGING: No results found.  Assessment/Plan:  58yo F with left septic arthritis recently instrumented with lateral sinus tract drainage roughly 1 week prior to presentation that could have introduced bacteria into joint space. POD#) from I x D  -continue with vancomycin plus ceftriaxone for now - will need to follow up culture in order to narrow abtx spectrum - will get blood cx due to fever in pacu. To see if any bacteremia - recommend to wait at least 24 hr picc line placement - anticipate 4-6 wk of IV abtx  Health maintenance = recommend hiv and hep C ab testing

## 2019-04-06 NOTE — Transfer of Care (Signed)
Immediate Anesthesia Transfer of Care Note  Patient: Rachel Huffman  Procedure(s) Performed: IRRIGATION AND DEBRIDEMENT knee scope (Left )  Patient Location: PACU  Anesthesia Type:General  Level of Consciousness: awake, oriented and patient cooperative  Airway & Oxygen Therapy: Patient Spontanous Breathing and Patient connected to face mask oxygen  Post-op Assessment: Report given to RN and Post -op Vital signs reviewed and stable  Post vital signs: Reviewed and stable  Last Vitals:  Vitals Value Taken Time  BP    Temp    Pulse    Resp    SpO2      Last Pain:  Vitals:   04/06/19 1258  TempSrc:   PainSc: 9       Patients Stated Pain Goal: 7 (08/67/61 9509)  Complications: No apparent anesthesia complications

## 2019-04-06 NOTE — Plan of Care (Signed)
  Problem: Education: Goal: Knowledge of General Education information will improve Description: Including pain rating scale, medication(s)/side effects and non-pharmacologic comfort measures Outcome: Progressing   Problem: Health Behavior/Discharge Planning: Goal: Ability to manage health-related needs will improve Outcome: Progressing   Problem: Clinical Measurements: Goal: Will remain free from infection Outcome: Progressing Goal: Diagnostic test results will improve Outcome: Progressing Goal: Respiratory complications will improve Outcome: Progressing Goal: Cardiovascular complication will be avoided Outcome: Progressing   Problem: Activity: Goal: Risk for activity intolerance will decrease Outcome: Progressing   

## 2019-04-06 NOTE — ED Notes (Signed)
Pt remains in pain, reports pain medication is not helping. This nurse notified EDP

## 2019-04-06 NOTE — Plan of Care (Signed)
  Problem: Education: Goal: Knowledge of General Education information will improve Description: Including pain rating scale, medication(s)/side effects and non-pharmacologic comfort measures 04/06/2019 1834 by Hubert Azure, RN Outcome: Progressing 04/06/2019 1437 by Hubert Azure, RN Outcome: Progressing   Problem: Health Behavior/Discharge Planning: Goal: Ability to manage health-related needs will improve 04/06/2019 1834 by Hubert Azure, RN Outcome: Progressing 04/06/2019 1437 by Hubert Azure, RN Outcome: Progressing   Problem: Clinical Measurements: Goal: Ability to maintain clinical measurements within normal limits will improve Outcome: Progressing Goal: Will remain free from infection 04/06/2019 1834 by Hubert Azure, RN Outcome: Progressing 04/06/2019 1437 by Hubert Azure, RN Outcome: Progressing Goal: Diagnostic test results will improve 04/06/2019 1834 by Hubert Azure, RN Outcome: Progressing 04/06/2019 1437 by Hubert Azure, RN Outcome: Progressing Goal: Respiratory complications will improve 04/06/2019 1834 by Hubert Azure, RN Outcome: Progressing 04/06/2019 1437 by Hubert Azure, RN Outcome: Progressing Goal: Cardiovascular complication will be avoided 04/06/2019 1834 by Hubert Azure, RN Outcome: Progressing 04/06/2019 1437 by Hubert Azure, RN Outcome: Progressing   Problem: Activity: Goal: Risk for activity intolerance will decrease Outcome: Progressing

## 2019-04-06 NOTE — ED Notes (Signed)
Ortho at bedside.

## 2019-04-07 ENCOUNTER — Encounter (HOSPITAL_COMMUNITY): Payer: Self-pay | Admitting: Specialist

## 2019-04-07 DIAGNOSIS — M009 Pyogenic arthritis, unspecified: Secondary | ICD-10-CM

## 2019-04-07 MED ORDER — CEFAZOLIN SODIUM-DEXTROSE 2-4 GM/100ML-% IV SOLN
2.0000 g | Freq: Three times a day (TID) | INTRAVENOUS | Status: DC
  Administered 2019-04-07 – 2019-04-09 (×6): 2 g via INTRAVENOUS
  Filled 2019-04-07 (×8): qty 100

## 2019-04-07 MED ORDER — GABAPENTIN 300 MG PO CAPS
300.0000 mg | ORAL_CAPSULE | Freq: Two times a day (BID) | ORAL | Status: DC
  Administered 2019-04-07 – 2019-04-08 (×3): 300 mg via ORAL
  Filled 2019-04-07 (×3): qty 1

## 2019-04-07 MED ORDER — CELECOXIB 200 MG PO CAPS
400.0000 mg | ORAL_CAPSULE | Freq: Every day | ORAL | Status: DC
  Administered 2019-04-07 – 2019-04-09 (×3): 400 mg via ORAL
  Filled 2019-04-07 (×3): qty 2

## 2019-04-07 NOTE — Anesthesia Postprocedure Evaluation (Signed)
Anesthesia Post Note  Patient: Rachel Huffman  Procedure(s) Performed: IRRIGATION AND DEBRIDEMENT knee scope (Left )     Patient location during evaluation: PACU Anesthesia Type: General Level of consciousness: sedated Pain management: pain level controlled Vital Signs Assessment: post-procedure vital signs reviewed and stable Respiratory status: spontaneous breathing Cardiovascular status: stable Postop Assessment: no apparent nausea or vomiting Anesthetic complications: no    Last Vitals:  Vitals:   04/06/19 2053 04/07/19 0428  BP: (!) 142/76 (!) 142/84  Pulse: 99 97  Resp: 20 19  Temp: 36.6 C 37 C  SpO2: 98% 99%    Last Pain:  Vitals:   04/07/19 0428  TempSrc: Oral  PainSc:    Pain Goal: Patients Stated Pain Goal: 7 (04/07/19 0409)                 Huston Foley

## 2019-04-07 NOTE — Progress Notes (Signed)
Patient lab results from [facility] from 8/3 received in the office today.  Results show Moderate growth Staphylococcus aureus that is susceptible to oxacillin.

## 2019-04-07 NOTE — Progress Notes (Signed)
Subjective: 1 Day Post-Op Procedure(s) (LRB): IRRIGATION AND DEBRIDEMENT knee scope (Left) Patient reports pain as a 6 Continues to have pain but appears less than previously  Objective: Vital signs in last 24 hours: Temp:  [97.8 F (36.6 C)-99.6 F (37.6 C)] 98.6 F (37 C) (08/05 0428) Pulse Rate:  [87-118] 97 (08/05 0428) Resp:  [12-20] 19 (08/05 0428) BP: (121-159)/(66-97) 142/84 (08/05 0428) SpO2:  [88 %-100 %] 99 % (08/05 0428) Weight:  [95.3 kg] 95.3 kg (08/04 1103)  Intake/Output from previous day: 08/04 0701 - 08/05 0700 In: 3079.2 [P.O.:560; I.V.:1969.2; IV Piggyback:550] Out: 8891 [QXIHW:3888; Drains:20; Blood:30] Intake/Output this shift: No intake/output data recorded.  Recent Labs    04/05/19 2338  HGB 14.4   Recent Labs    04/05/19 2338  WBC 12.4*  RBC 4.88  HCT 44.1  PLT 275   Recent Labs    04/05/19 2338  NA 137  K 3.4*  CL 105  CO2 22  BUN 13  CREATININE 0.84  GLUCOSE 133*  CALCIUM 9.0   No results for input(s): LABPT, INR in the last 72 hours.  Neurologically intact Neurovascular intact Sensation intact distally Intact pulses distally Dorsiflexion/Plantar flexion intact Incision: dressing C/D/I No cellulitis present Compartment soft hemovac small amount of drainage  Assessment/Plan: 1 Day Post-Op Procedure(s) (LRB): IRRIGATION AND DEBRIDEMENT knee scope (Left) Continue ABX therapy due to Post-op infection Culture Pending   Appreciate DR Baxter Flattery help. Discussed with nurse will leave foley today and hold PIC line till tomorrow when afebrile. Will add celebrex and gabapentin for pain control. Asked and encoraged all questions.    Rachel Huffman 04/07/2019, 9:02 AM

## 2019-04-07 NOTE — Plan of Care (Signed)
  Problem: Education: Goal: Knowledge of General Education information will improve Description: Including pain rating scale, medication(s)/side effects and non-pharmacologic comfort measures Outcome: Progressing   Problem: Health Behavior/Discharge Planning: Goal: Ability to manage health-related needs will improve Outcome: Progressing   Problem: Clinical Measurements: Goal: Ability to maintain clinical measurements within normal limits will improve Outcome: Progressing Goal: Will remain free from infection Outcome: Progressing Goal: Diagnostic test results will improve Outcome: Progressing Goal: Respiratory complications will improve Outcome: Progressing Goal: Cardiovascular complication will be avoided Outcome: Progressing   Problem: Nutrition: Goal: Adequate nutrition will be maintained Outcome: Progressing   Problem: Elimination: Goal: Will not experience complications related to bowel motility Outcome: Progressing Goal: Will not experience complications related to urinary retention Outcome: Progressing   Problem: Safety: Goal: Ability to remain free from injury will improve Outcome: Progressing   Problem: Skin Integrity: Goal: Risk for impaired skin integrity will decrease Outcome: Progressing   Problem: Education: Goal: Required Educational Video(s) Outcome: Progressing   Problem: Clinical Measurements: Goal: Postoperative complications will be avoided or minimized Outcome: Progressing   Problem: Skin Integrity: Goal: Demonstration of wound healing without infection will improve Outcome: Progressing

## 2019-04-07 NOTE — Progress Notes (Signed)
    [name] for Infectious Disease    Date of Admission:  04/05/2019   Total days of antibiotics 3   ID: Rachel Huffman is a 58 y.o. female with  Septic arthritis of left knee Active Problems:   Septic joint of left knee joint (HCC)    Subjective: Afebrile, some discomfort to left knee  Medications:  . acetaminophen  650 mg Oral Q6H  . celecoxib  400 mg Oral Daily  . docusate sodium  100 mg Oral BID  . gabapentin  300 mg Oral BID  . pantoprazole  40 mg Oral Daily    Objective: Vital signs in last 24 hours: Temp:  [97.8 F (36.6 C)-98.6 F (37 C)] 98.6 F (37 C) (08/05 0428) Pulse Rate:  [88-99] 97 (08/05 0428) Resp:  [19-20] 19 (08/05 0428) BP: (128-142)/(68-84) 142/84 (08/05 0428) SpO2:  [98 %-99 %] 99 % (08/05 0428) Physical Exam  Constitutional:  oriented to person, place, and time. appears well-developed and well-nourished. No distress.  HENT: [place]/AT, PERRLA, no scleral icterus Mouth/Throat: Oropharynx is clear and moist. No oropharyngeal exudate.  Cardiovascular: Normal rate, regular rhythm and normal heart sounds. Exam reveals no gallop and no friction rub.  No murmur heard.  Pulmonary/Chest: Effort normal and breath sounds normal. No respiratory distress.  has no wheezes.  Neck = supple, no nuchal rigidity Abdominal: Soft. Bowel sounds are normal.  exhibits no distension. There is no tenderness.  Lymphadenopathy: no cervical adenopathy. No axillary adenopathy Neurological: alert and oriented to person, place, and time.  Skin: Skin is warm and dry. No rash noted. No erythema.  Psychiatric: a normal mood and affect.  behavior is normal.   Lab Results Recent Labs    04/05/19 2338  WBC 12.4*  HGB 14.4  HCT 44.1  NA 137  K 3.4*  CL 105  CO2 22  BUN 13  CREATININE 0.84   Sedimentation Rate Recent Labs    04/05/19 2338  ESRSEDRATE 43*   C-Reactive Protein Recent Labs    04/05/19 2338  CRP 34.5*    Microbiology: 8/4 + growth, too early to  count  Assessment/Plan: Septic arthritis of left knee = continue on vancomycin, and cefazolin for now. Can place picc line tomorrow if blood cx. Plan to treat for 4-6 wk.  [facility] for Infectious Diseases Cell: [phone] Pager: [phone]  04/07/2019, 5:30 PM

## 2019-04-08 ENCOUNTER — Inpatient Hospital Stay: Payer: Self-pay

## 2019-04-08 LAB — CREATININE, SERUM
Creatinine, Ser: 0.78 mg/dL (ref 0.44–1.00)
GFR calc Af Amer: >60 mL/min (ref >60)
GFR calc non Af Amer: >60 mL/min (ref >60)

## 2019-04-08 MED ORDER — SENNA 8.6 MG PO TABS
1.0000 | ORAL_TABLET | Freq: Every day | ORAL | Status: DC
  Administered 2019-04-08 – 2019-04-09 (×2): 8.6 mg via ORAL
  Filled 2019-04-08 (×2): qty 1

## 2019-04-08 MED ORDER — POLYETHYLENE GLYCOL 3350 17 G PO PACK: 17.0000 g | PACK | Freq: Every day | ORAL | Status: DC | PRN

## 2019-04-08 MED ORDER — GABAPENTIN 300 MG PO CAPS
300.0000 mg | ORAL_CAPSULE | Freq: Three times a day (TID) | ORAL | Status: DC
  Administered 2019-04-08 – 2019-04-09 (×3): 300 mg via ORAL
  Filled 2019-04-08 (×3): qty 1

## 2019-04-08 MED ORDER — SODIUM CHLORIDE 0.9% FLUSH: 10.0000 mL | INTRAVENOUS | Status: DC | PRN

## 2019-04-08 MED ORDER — SODIUM CHLORIDE 0.9% FLUSH
10.0000 mL | Freq: Two times a day (BID) | INTRAVENOUS | Status: DC
  Administered 2019-04-09: 10 mL

## 2019-04-08 NOTE — Progress Notes (Addendum)
    [facility] for Infectious Disease    Date of Admission:  04/05/2019   Total days of antibiotics 4           ID: Rachel Huffman is a 58 y.o. female with  Active Problems:   Septic joint of left knee joint (HCC)    Subjective: Afebrile, still having some knee pain. getting picc line placed Medications:  . acetaminophen  650 mg Oral Q6H  . celecoxib  400 mg Oral Daily  . docusate sodium  100 mg Oral BID  . gabapentin  300 mg Oral TID  . pantoprazole  40 mg Oral Daily  . senna  1 tablet Oral Daily    Objective: Vital signs in last 24 hours: Temp:  [97.5 F (36.4 C)-99.5 F (37.5 C)] 98.3 F (36.8 C) (08/06 1431) Pulse Rate:  [78-105] 101 (08/06 1431) Resp:  [16-20] 16 (08/06 1431) BP: (119-148)/(71-76) 119/76 (08/06 1431) SpO2:  [93 %-100 %] 96 % (08/06 1431) Unable to examine due to picc line placement   Lab Results Recent Labs    04/05/19 2338 04/07/19 2358  WBC 12.4*  --   HGB 14.4  --   HCT 44.1  --   NA 137  --   K 3.4*  --   CL 105  --   CO2 22  --   BUN 13  --   CREATININE 0.84 0.78   Liver Panel No results for input(s): PROT, ALBUMIN, AST, ALT, ALKPHOS, BILITOT, BILIDIR, IBILI in the last 72 hours. Sedimentation Rate Recent Labs    04/05/19 2338  ESRSEDRATE 43*   C-Reactive Protein Recent Labs    04/05/19 2338  CRP 34.5*    Microbiology: MSSA at outpatient, and staph aureus sensitivity pending Studies/Results: [place] Ekg Site Rite  Result Date: 04/08/2019 If Site Rite image not attached, placement could not be confirmed due to current cardiac rhythm.    Assessment/Plan: MSSA native joint septic arthritis of left knee = will plan to do 4 wk of cefazolin. Using 8/5 as day 1, and sep 2nd as End date  Will need weekly picc line care, cbc, bmp, and sed rate and crp.  Can d/c vancomycin  [facility] for Infectious Diseases Cell: [phone] Pager: [phone]  04/08/2019, 4:18 PM

## 2019-04-08 NOTE — Progress Notes (Signed)
PHARMACY CONSULT NOTE FOR:  OUTPATIENT  PARENTERAL ANTIBIOTIC THERAPY (OPAT)  Indication: septic joint (Left Knee) Regimen: cefazolin 2gm IV q8h End date: 05/05/2019  IV antibiotic discharge orders are pended. To discharging provider:  please sign these orders via discharge navigator,  Select New Orders & click on the button choice - Manage This Unsigned Work.     Thank you for allowing pharmacy to be a part of this patient's care.  Doreene Eland, PharmD, BCPS.   Work Cell: [phone] 04/08/2019 4:25 PM

## 2019-04-08 NOTE — Plan of Care (Signed)
Continue to have frequent pain medication needs.Marland Kitchen

## 2019-04-08 NOTE — Progress Notes (Signed)
Subjective: 2 Days Post-Op Procedure(s) (LRB): IRRIGATION AND DEBRIDEMENT knee scope (Left) Patient reports pain as at times severe Patie in room with her tearing after going to the bathroom complains of severe pain after getting up and moving. Objective: Vital signs in last 24 hours: Temp:  [97.5 F (36.4 C)-99.5 F (37.5 C)] 97.9 F (36.6 C) (08/06 0936) Pulse Rate:  [78-105] 105 (08/06 0936) Resp:  [16-20] 16 (08/06 0936) BP: (123-148)/(71-76) 123/76 (08/06 0936) SpO2:  [93 %-100 %] 93 % (08/06 0936)  Intake/Output from previous day: 08/05 0701 - 08/06 0700 In: 850 [P.O.:600; IV Piggyback:250] Out: 2300 [Urine:2250; Drains:50] Intake/Output this shift: Total I/O In: 120 [P.O.:120] Out: 100 [Urine:100]  Recent Labs    04/05/19 2338  HGB 14.4   Recent Labs    04/05/19 2338  WBC 12.4*  RBC 4.88  HCT 44.1  PLT 275   Recent Labs    04/05/19 2338 04/07/19 2358  NA 137  --   K 3.4*  --   CL 105  --   CO2 22  --   BUN 13  --   CREATININE 0.84 0.78  GLUCOSE 133*  --   CALCIUM 9.0  --    No results for input(s): LABPT, INR in the last 72 hours.  Left lower extremity dressing change Hemovac pulled without problems minimal serosanguineous drainage no purulence knee not warm to touch not erythematous no significant effusion wound healing well benign appearance calf soft dorsiflexion plantarflexion active and passive without pain.  Quad function poor   Assessment/Plan: 2 Days Post-Op Procedure(s) (LRB): IRRIGATION AND DEBRIDEMENT knee scope (Left) Continue ABX therapy due to Post-op infection [facility] culture and sensitivity came back yesterday Staphylococcus aureus sensitive to oxacillin.  No fever past 24 hours PICC line ordered for today we will leave IV antibiotics up to Dr. Graylon Good factious disease  Have increase gabapentin to 3 times daily continue Celebrex twice daily continue opioid medication as necessary encourage patient to do more with lower extremity  weight-bear also encouraged ankle pumps quad sets and straight leg raises in the bed DC the knee immobilizer ablation doing well with more movement versus less all questions encouraged and answered with patient and husband at bedside.  If can get PICC line in today IV antibiotic of choice determined and arranged and pain under control will be allowed to discharge to home tomorrow if continues to guard the knee will need to add physical therapy for desensitization and assistance in mobilization.   Rachel Huffman Rachel Huffman 04/08/2019, 10:36 AM

## 2019-04-08 NOTE — Progress Notes (Signed)
Peripherally Inserted Central Catheter/Midline Placement  The IV Nurse has discussed with the patient and/or persons authorized to consent for the patient, the purpose of this procedure and the potential benefits and risks involved with this procedure.  The benefits include less needle sticks, lab draws from the catheter, and the patient may be discharged home with the catheter. Risks include, but not limited to, infection, bleeding, blood clot (thrombus formation), and puncture of an artery; nerve damage and irregular heartbeat and possibility to perform a PICC exchange if needed/ordered by physician.  Alternatives to this procedure were also discussed.  Bard Power PICC patient education guide, fact sheet on infection prevention and patient information card has been provided to patient /or left at bedside.    PICC/Midline Placement Documentation  PICC Single Lumen 04/08/19 PICC Right Brachial 43 cm 0 cm (Active)  Indication for Insertion or Continuance of Line Home intravenous therapies (PICC only) 04/08/19 1645  Exposed Catheter (cm) 0 cm 04/08/19 1645  Site Assessment Clean;Dry;Intact 04/08/19 1645  Line Status Flushed;Saline locked;Blood return noted 04/08/19 1645  Dressing Type Transparent;Securing device 04/08/19 1645  Dressing Status Clean;Dry;Intact;Antimicrobial disc in place 04/08/19 1645  Dressing Intervention New dressing 04/08/19 1645  Dressing Change Due 04/15/19 04/08/19 [place] 04/08/2019, 5:08 PM

## 2019-04-09 MED ORDER — GABAPENTIN 300 MG PO CAPS: 300.0000 mg | ORAL_CAPSULE | Freq: Two times a day (BID) | ORAL | 0 refills | Status: DC

## 2019-04-09 MED ORDER — CELECOXIB 200 MG PO CAPS: 200.0000 mg | ORAL_CAPSULE | Freq: Every day | ORAL | 0 refills | Status: AC

## 2019-04-09 MED ORDER — CEFAZOLIN IV (FOR PTA / DISCHARGE USE ONLY): 2.0000 g | Freq: Three times a day (TID) | INTRAVENOUS | 0 refills | Status: AC

## 2019-04-09 MED ORDER — HEPARIN SOD (PORK) LOCK FLUSH 100 UNIT/ML IV SOLN
250.0000 [IU] | INTRAVENOUS | Status: AC | PRN
  Administered 2019-04-09: 250 [IU]

## 2019-04-09 MED ORDER — LIP MEDEX EX OINT
TOPICAL_OINTMENT | CUTANEOUS | Status: AC
  Administered 2019-04-09: 1
  Filled 2019-04-09: qty 7

## 2019-04-09 MED ORDER — CYCLOBENZAPRINE HCL 10 MG PO TABS: 10.0000 mg | ORAL_TABLET | Freq: Three times a day (TID) | ORAL | 0 refills | Status: AC | PRN

## 2019-04-09 MED ORDER — OXYCODONE-ACETAMINOPHEN 5-325 MG PO TABS: 1.0000 | ORAL_TABLET | Freq: Four times a day (QID) | ORAL | 0 refills | Status: AC | PRN

## 2019-04-09 NOTE — Discharge Summary (Signed)
Physician Discharge Summary  Patient ID: Rachel Huffman MRN: [id number] DOB/AGE: Feb 05, 1961 58 y.o.  Admit date: 04/05/2019 Discharge date: 04/09/2019  Admission Diagnoses: Septic joint of left knee  Discharge Diagnoses:  Active Problems:   Septic joint of left knee joint [facility])   Discharged Condition: good  Hospital Course: Patient was admitted on August 3 due to a infected knee joint status post knee arthroscopy, partial medial meniscectomy.  She presented in the office with a significant pain of the knee.  Patient had a washout of her knee on August 4 day 1 in the hospital.  New cultures were drawn at that time.  Also outside lab results were collected in the office prior to her admission.  They did grow out a penicillin susceptible staph aureus.  The washout was done with no major issues.  Patient was set up to recovery in stable condition.  Infectious disease was consulted.  She was placed on IV cefazolin.  Postop day 1 patient unfortunately was having some pretty significant pain in the knee after the surgery.  Drain was left in on this day with minimal amount of drainage.  New pain medications were added, infection disease was consulted.  Postop day 2 the patient was afebrile.  Drain was removed.  PICC line was placed with no issues.  Infectious disease advised patient to go home with IV cefazolin for 4 weeks.  Postop day 3 patient is doing well.  She is ready to go home today.  Dressings were changed 1 final time.  PT was consulted to provide patient with security incompetence to be able to ambulate around her home with the help of walker or crutch safely.  Patient was discharged in stable condition.  Consults: ID  Significant Diagnostic Studies: labs: At time of discharge cultures are still pending.  Outside cultures did show methicillin-sensitive staph aureus  Treatments: antibiotics: Ancef, analgesia: acetaminophen w/ codeine, Dilaudid and Celebrex, Gabapentin and procedures: PICC  line  Discharge Exam: Blood pressure 126/83, pulse (!) 107, temperature 99 F (37.2 C), resp. rate 14, height _0  (1.702 m), weight 95.3 kg, SpO2 96 %. General appearance: alert, cooperative, appears stated age and no distress Extremities: edema mild in left knee, no edema, redness or tenderness in the calves or thighs and able to actively dorsiflex and plantarflex of the left ankle with no pain.  Pain with any passive or active range of motion of the left knee.  Weakness of the quad muscle. Pulses: 2+ and symmetric Skin: Skin color, texture, turgor normal. No rashes or lesions Neurologic: Grossly normal Incision/Wound: Incision sites are healing up nicely, no active signs of any infection.  No drainage, erythema or ecchymosis noted at the incision sites.  Disposition: Discharge disposition: 01-Home or Self Care       Discharge Instructions    Call MD / Call 911   Complete by: As directed    If you experience chest pain or shortness of breath, CALL 911 and be transported to the hospital emergency room.  If you develope a fever above 101 F, pus (white drainage) or increased drainage or redness at the wound, or calf pain, call your surgeon's office.   Constipation Prevention   Complete by: As directed    Drink plenty of fluids.  Prune juice may be helpful.  You may use a stool softener, such as Colace (over the counter) 100 mg twice a day.  Use MiraLax (over the counter) for constipation as needed.   Diet - low  sodium heart healthy   Complete by: As directed    Diet general   Complete by: As directed    Home infusion instructions [facility] May follow [place] Dosing Protocol; May administer Cathflo as needed to maintain patency of vascular access device.; Flushing of vascular access device: per [facility] Protocol: 0.9% NaCl pre/post medica...   Complete by: As directed    Instructions: May follow [place] Dosing Protocol   Instructions: May administer Cathflo as needed to  maintain patency of vascular access device.   Instructions: Flushing of vascular access device: per [facility] Protocol: 0.9% NaCl pre/post medication administration and prn patency; Heparin 100 u/ml, 39m for implanted ports and Heparin 10u/ml, 54mfor all other central venous catheters.   Instructions: May follow [facility] Anaphylaxis Protocol for First Dose Administration in the home: 0.9% NaCl at 25-50 ml/hr to maintain IV access for protocol meds. Epinephrine 0.3 ml IV/IM PRN and Benadryl 25-50 IV/IM PRN s/s of anaphylaxis.   Instructions: [facility] Coordinator (RN) to assist per patient IV care needs in the home PRN.   Increase activity slowly as tolerated   Complete by: As directed    Walker rolling   Complete by: As directed      Allergies as of 04/09/2019      Reactions   Clarithromycin    REACTION: diarrhea   Doxycycline Nausea Only      Medication List    STOP taking these medications   cephALEXin 500 MG capsule Commonly known as: KEFLEX   HYDROcodone-acetaminophen 5-325 MG tablet Commonly known as: NORCO/VICODIN     TAKE these medications   albuterol 108 (90 Base) MCG/ACT inhaler Commonly known as: VENTOLIN HFA Inhale 2 puffs into the lungs every 6 (six) hours as needed for wheezing or shortness of breath.   ceFAZolin  IVPB Commonly known as: ANCEF Inject 2 g into the vein every 8 (eight) hours for 26 days. Indication:  septic joint (Left Knee) Last Day of Therapy:  05/05/2019 Labs - Once weekly:  CBC/D and BMP, ESR and CRP   celecoxib 200 MG capsule Commonly known as: CeleBREX Take 1 capsule (200 mg total) by mouth daily.   cyclobenzaprine 10 MG tablet Commonly known as: FLEXERIL Take 1 tablet (10 mg total) by mouth 3 (three) times daily as needed for muscle spasms.   gabapentin 300 MG capsule Commonly known as: Neurontin Take 1 capsule (300 mg total) by mouth 2 (two) times daily.   oxyCODONE-acetaminophen 5-325 MG tablet Commonly known as:  Percocet Take 1-2 tablets by mouth every 6 (six) hours as needed for up to 7 days for severe pain.   pantoprazole 40 MG tablet Commonly known as: PROTONIX TAKE 1 TABLET BY MOUTH DAILY ABOUT 30 TO 60 MINUTES BEFORE SUPPER What changed: See the new instructions.            Home Infusion Instuctions  (From admission, onward)         Start     Ordered   04/09/19 0000  Home infusion instructions [facility] May follow [facility] Protocol; May administer Cathflo as needed to maintain patency of vascular access device.; Flushing [facility] vascular [facility]: 0.9% NaCl pre/post medica...    Question Answer Comment  Instructions May follow [place] Protocol   Instructions May administer Cathflo as needed to maintain patency of vascular access device.   Instructions Flushing of vascular access device: [facility]: 0.9% NaCl pre/post medication  administration and prn patency; Heparin 100 u/ml, 40m for implanted ports and Heparin 10u/ml, 540mfor all other central venous catheters.   Instructions May follow [facility] Anaphylaxis Protocol for First Dose Administration in the home: 0.9% NaCl at 25-50 ml/hr to maintain IV access for protocol meds. Epinephrine 0.3 ml IV/IM PRN and Benadryl 25-50 IV/IM PRN s/s of anaphylaxis.   Instructions [facility] Infusion Coordinator (RN) to assist per patient IV care needs in the home PRN.      04/09/19 0912         Follow-up Information    CoSydnee CabalMD Follow up in 1 week(s).   Specialty: Orthopedic Surgery Contact information: [phone]         Signed: LeDrue Novel/03/2019, 9:16 AM

## 2019-04-09 NOTE — Discharge Instructions (Signed)
Patient is allowed to weight bear as tolerated Patient is encouraged to get up and move as much as possible while at home Patient should not wear knee immobilizer anymore

## 2019-04-09 NOTE — Progress Notes (Signed)
AV[facility] discharge instructions reviewed and given to the patient and her husband. They have no questions. Nursing staff preparing for discharge.

## 2019-04-09 NOTE — Progress Notes (Signed)
Subjective: Patient is postop day 3 of a left knee irrigation debridement and left knee arthroscopic. Patient is still reporting severe pain in the knee, she states that minimal improvement was made after the drain was removed. She states that she has been trying to get up more moving the knee. She states that she has been doing the exercises that she was instructed to do previously.  They do seem to bother her but she states that she is getting better.   Objective: Vital signs in last 24 hours: Temp:  [97.9 F (36.6 C)-99 F (37.2 C)] 99 F (37.2 C) (08/07 0630) Pulse Rate:  [88-129] 107 (08/07 0630) Resp:  [14-16] 14 (08/07 0630) BP: (119-131)/(76-83) 126/83 (08/07 0630) SpO2:  [93 %-97 %] 96 % (08/07 0630)  Intake/Output from previous day: 08/06 0701 - 08/07 0700 In: 740 [P.O.:540; IV Piggyback:200] Out: 1600 [Urine:1600] Intake/Output this shift: Total I/O In: 120 [P.O.:120] Out: -   No results for input(s): HGB in the last 72 hours. No results for input(s): WBC, RBC, HCT, PLT in the last 72 hours. Recent Labs    04/07/19 2358  CREATININE 0.78   No results for input(s): LABPT, INR in the last 72 hours.  On exam the left knee the incision sites show no obvious signs of any active infection.  No drainage.  No erythema, no warmth to touch.  No significant effusion of the left knee noted.  Calf is soft, active and passive dorsiflexion and plantarflexion of the ankle done with no pain.  Poor quad function.    Assessment/Plan: Postop day 2 I&D of the left knee. Patient should be okay to be discharged today.  I long conversation with her about it.  Discharge instructions have been placed and discharge orders have been placed. She is going to be sent home on IV cefazolin through infectious disease.  PICC line was placed yesterday. Still awaiting pending lab results from surgery on Tuesday. We are discharging her on gabapentin, Flexeril, oxycodone and Celebrex as needed for  pain. She should do MiraLAX 1 scoop a day for a bowel movement help as well as Colace 1 tablet twice a day. We do not want her in the knee immobilizer anymore.  Want her to continue doing the exercises that we showed her yesterday, ankle pumps, quad sets and straight leg raises. I encouraged her to get up and moving as much as possible, weightbearing as tolerated. We want her to follow-up in our office in 1 week. She is okay to shower, take the bandages off and place Band-Aids over the incision sites.   Drue Novel 04/09/2019, 9:27 AM

## 2019-04-09 NOTE — TOC Transition Note (Signed)
Transition of Care [facility]) - CM/SW Discharge Note   Patient Details  Name: Rachel Huffman MRN: [id number] Date of Birth: 08/14/1961  [facility] of [facility]) CM/SW Contact:  Lia Hopping, [place] Phone Number: 04/09/2019, 9:52 AM   Clinical Narrative:    IV antibiotics for 4 weeks, Rep Jeannene Patella w/ [employer] provided instruction and arranged services.  [place] to provide RN DME-RW walker ordered through [facility]    Final next level of care: [place] Barriers to Discharge: No Barriers Identified   Patient Goals and CMS Choice Patient states their goals for this hospitalization and ongoing recovery are:: Recover at Discharge CMS Medicare.gov Compare Post Acute Care list provided to:: Patient    Discharge Placement  Home                     Discharge Plan and Services                DME Arranged: Walker rolling DME Agency: [employer] Date DME Agency Contacted: 04/09/19 Time DME Agency Contacted: 0900 Representative spoke with at DME Agency: Ovid Curd HH Arranged: PT [place] Agency: Other - See comment([facility]-RN) Date [place]: 04/09/19 Time [place]: [phone] Representative spoke with at [place]: [place] agency(confirmed)  Social Determinants of Health ([place]) Interventions     Readmission Risk Interventions No flowsheet data found.

## 2019-04-09 NOTE — Evaluation (Signed)
Physical Therapy Evaluation Patient Details Name: Rachel Huffman A Messineo MRN: [id number] DOB: 03/15/1961 Today's Date: 04/09/2019   History of Present Illness  Pt s/p I&D of L knee 2 wks s/p L knee arthroscopy  Clinical Impression  Pt s/p L knee I&D and presents with decreased L LE strength/ROM and post op pain limiting functional mobility.  Pt should progress to dc home with family assist.    Follow Up Recommendations No PT follow up    Equipment Recommendations  Rolling walker with 5" wheels    Recommendations for Other Services       Precautions / Restrictions Precautions Precautions: Fall Required Braces or Orthoses: Knee Immobilizer - Left Knee Immobilizer - Left: Discontinue once straight leg raise with < 10 degree lag Restrictions Weight Bearing Restrictions: No      Mobility  Bed Mobility Overal bed mobility: Needs Assistance Bed Mobility: Supine to Sit     Supine to sit: Min assist     General bed mobility comments: min assist for L LE  Transfers Overall transfer level: Needs assistance Equipment used: Rolling walker (2 wheeled) Transfers: Sit to/from Stand Sit to Stand: Min guard         General transfer comment: cues for LE management and use of UEs to self assist  Ambulation/Gait Ambulation/Gait assistance: Min assist Gait Distance (Feet): 75 Feet Assistive device: Rolling walker (2 wheeled) Gait Pattern/deviations: Step-to pattern;Decreased step length - right;Decreased step length - left;Shuffle;Trunk flexed Gait velocity: decr   General Gait Details: cues for sequence, posture and position from RW  Stairs Stairs: Yes Stairs assistance: Min assist Stair Management: One rail Right;Step to pattern;Forwards;With crutches Number of Stairs: 5 General stair comments: cues for sequence and foot/crutch placement  Wheelchair Mobility    Modified Rankin (Stroke Patients Only)       Balance Overall balance assessment: Needs  assistance Sitting-balance support: No upper extremity supported;Feet supported Sitting balance-Leahy Scale: Good     Standing balance support: Bilateral upper extremity supported Standing balance-Leahy Scale: Fair                               Pertinent Vitals/Pain Pain Assessment: 0-10 Pain Score: 8  Pain Location: L knee Pain Descriptors / Indicators: Aching;Guarding;Grimacing;Sore Pain Intervention(s): Limited activity within patient's tolerance;Monitored during session;Premedicated before session;Ice applied    Home Living Family/patient expects to be discharged to:: Private residence Living Arrangements: Spouse/significant other Available Help at Discharge: Family Type of Home: House Home Access: Stairs to enter Entrance Stairs-Rails: Right Entrance Stairs-Number of Steps: 4 Home Layout: One level Home Equipment: Crutches      Prior Function Level of Independence: Independent               Hand Dominance        Extremity/Trunk Assessment   Upper Extremity Assessment Upper Extremity Assessment: Overall WFL for tasks assessed    Lower Extremity Assessment Lower Extremity Assessment: LLE deficits/detail LLE: Unable to fully assess due to pain    Cervical / Trunk Assessment Cervical / Trunk Assessment: Normal  Communication   Communication: No difficulties  Cognition Arousal/Alertness: Awake/alert Behavior During Therapy: WFL for tasks assessed/performed Overall Cognitive Status: Within Functional Limits for tasks assessed                                        General Comments  Exercises     Assessment/Plan    PT Assessment Patient needs continued PT services  PT Problem List Decreased strength;Decreased range of motion;Decreased activity tolerance;Decreased balance;Decreased mobility;Decreased knowledge of use of DME;Pain;Obesity       PT Treatment Interventions      PT Goals (Current goals can be found  in the Care Plan section)  Acute Rehab PT Goals Patient Stated Goal: Walk with less pain PT Goal Formulation: With patient Time For Goal Achievement: 04/16/19 Potential to Achieve Goals: Good    Frequency 7X/week   Barriers to discharge        Co-evaluation               AM-PAC PT "6 Clicks" Mobility  Outcome Measure Help needed turning from your back to your side while in a flat bed without using bedrails?: A Little Help needed moving from lying on your back to sitting on the side of a flat bed without using bedrails?: A Little Help needed moving to and from a bed to a chair (including a wheelchair)?: A Little Help needed standing up from a chair using your arms (e.g., wheelchair or bedside chair)?: A Little Help needed to walk in hospital room?: A Little Help needed climbing 3-5 steps with a railing? : A Little 6 Click Score: 18    End of Session Equipment Utilized During Treatment: Gait belt Activity Tolerance: Patient tolerated treatment well Patient left: in chair;with call bell/phone within reach Nurse Communication: Mobility status PT Visit Diagnosis: Difficulty in walking, not elsewhere classified (R26.2);Pain    Time: 2376-2831 PT Time Calculation (min) (ACUTE ONLY): 29 min   Charges:   PT Evaluation $PT Eval Low Complexity: 1 Low PT Treatments $Gait Training: 8-22 mins        Debe Coder PT Acute Rehabilitation Services Pager [phone] Office [phone]   Zohar Maroney 04/09/2019, 12:39 PM

## 2019-04-11 LAB — CULTURE, BLOOD (ROUTINE X 2)
Culture: NO GROWTH
Culture: NO GROWTH
Special Requests: ADEQUATE
Special Requests: ADEQUATE

## 2019-04-11 LAB — AEROBIC/ANAEROBIC CULTURE W GRAM STAIN (SURGICAL/DEEP WOUND)

## 2019-04-12 ENCOUNTER — Other Ambulatory Visit
Admission: RE | Admit: 2019-04-12 | Discharge: 2019-04-12 | Disposition: A | Discharge status: Home / Self Care | Payer: 59 | Source: Other Acute Inpatient Hospital | Attending: Internal Medicine | Admitting: Internal Medicine

## 2019-04-12 DIAGNOSIS — M00869 Arthritis due to other bacteria, unspecified knee: Secondary | ICD-10-CM | POA: Insufficient documentation

## 2019-04-12 LAB — CBC WITH DIFFERENTIAL/PLATELET
Abs Immature Granulocytes: 0.1 10*3/uL — ABNORMAL HIGH (ref 0.00–0.07)
Basophils Absolute: 0 10*3/uL (ref 0.0–0.1)
Basophils Relative: 1 %
Eosinophils Absolute: 0.3 10*3/uL (ref 0.0–0.5)
Eosinophils Relative: 3 %
HCT: 32.9 % — ABNORMAL LOW (ref 36.0–46.0)
Hemoglobin: 10.4 g/dL — ABNORMAL LOW (ref 12.0–15.0)
Immature Granulocytes: 1 %
Lymphocytes Relative: 16 %
Lymphs Abs: 1.2 10*3/uL (ref 0.7–4.0)
MCH: 28.7 pg (ref 26.0–34.0)
MCHC: 31.6 g/dL (ref 30.0–36.0)
MCV: 90.6 fL (ref 80.0–100.0)
Monocytes Absolute: 0.7 10*3/uL (ref 0.1–1.0)
Monocytes Relative: 9 %
Neutro Abs: 5.3 10*3/uL (ref 1.7–7.7)
Neutrophils Relative %: 70 %
Platelets: 383 10*3/uL (ref 150–400)
RBC: 3.63 MIL/uL — ABNORMAL LOW (ref 3.87–5.11)
RDW: 14.1 % (ref 11.5–15.5)
WBC: 7.5 10*3/uL (ref 4.0–10.5)
nRBC: 0 % (ref 0.0–0.2)

## 2019-04-12 LAB — BASIC METABOLIC PANEL
Anion gap: 12 (ref 5–15)
BUN: 11 mg/dL (ref 6–20)
CO2: 28 mmol/L (ref 22–32)
Calcium: 8.4 mg/dL — ABNORMAL LOW (ref 8.9–10.3)
Chloride: 103 mmol/L (ref 98–111)
Creatinine, Ser: 0.68 mg/dL (ref 0.44–1.00)
GFR calc Af Amer: >60 mL/min (ref >60)
GFR calc non Af Amer: >60 mL/min (ref >60)
Glucose, Bld: 86 mg/dL (ref 70–99)
Potassium: 3 mmol/L — ABNORMAL LOW (ref 3.5–5.1)
Sodium: 143 mmol/L (ref 135–145)

## 2019-04-12 LAB — SEDIMENTATION RATE: Sed Rate: 95 mm/hr — ABNORMAL HIGH (ref 0–30)

## 2019-04-13 ENCOUNTER — Encounter: Payer: 59 | Admitting: Internal Medicine

## 2019-04-13 LAB — C-REACTIVE PROTEIN: CRP: 38.7 mg/dL — ABNORMAL HIGH

## 2019-04-14 ENCOUNTER — Telehealth: Payer: Self-pay

## 2019-04-14 NOTE — Telephone Encounter (Signed)
Received call from [facility] with Dr. Theda Sers at [facility]. Stating MD would like to speak with Dr. Baxter Flattery regarding patient's labs. Informed RN that Dr. Baxter Flattery was currently in clinic and would give her the message. Routing to MD to contact Dr. Theda Sers at [facility]

## 2019-04-22 ENCOUNTER — Telehealth: Payer: Self-pay

## 2019-04-22 NOTE — Telephone Encounter (Signed)
Received a call from Rachel Huffman who has questions regarding lab work. States it's been three weeks since labs were done, and would like to know how everything came out. Patient would also like to know how long she will have her picc line. Will route message to MD to advise on labs and how long patient will need picc line. Romeo

## 2019-04-26 ENCOUNTER — Other Ambulatory Visit (HOSPITAL_COMMUNITY)
Admission: RE | Admit: 2019-04-26 | Discharge: 2019-04-26 | Disposition: A | Discharge status: Home / Self Care | Payer: 59 | Source: Other Acute Inpatient Hospital | Attending: Internal Medicine | Admitting: Internal Medicine

## 2019-04-26 ENCOUNTER — Ambulatory Visit: Payer: 59 | Admitting: Internal Medicine

## 2019-04-26 DIAGNOSIS — M00869 Arthritis due to other bacteria, unspecified knee: Secondary | ICD-10-CM | POA: Diagnosis not present

## 2019-04-26 LAB — BASIC METABOLIC PANEL
Anion gap: 11 (ref 5–15)
BUN: 9 mg/dL (ref 6–20)
CO2: 25 mmol/L (ref 22–32)
Calcium: 9.2 mg/dL (ref 8.9–10.3)
Chloride: 108 mmol/L (ref 98–111)
Creatinine, Ser: 0.71 mg/dL (ref 0.44–1.00)
GFR calc Af Amer: >60 mL/min (ref >60)
GFR calc non Af Amer: >60 mL/min (ref >60)
Glucose, Bld: 145 mg/dL — ABNORMAL HIGH (ref 70–99)
Potassium: 3.2 mmol/L — ABNORMAL LOW (ref 3.5–5.1)
Sodium: 144 mmol/L (ref 135–145)

## 2019-04-26 LAB — C-REACTIVE PROTEIN: CRP: 13.9 mg/dL — ABNORMAL HIGH (ref <1.0)

## 2019-04-26 LAB — SEDIMENTATION RATE: Sed Rate: 97 mm/hr — ABNORMAL HIGH (ref 0–22)

## 2019-04-27 LAB — CBC WITH DIFFERENTIAL/PLATELET
Abs Immature Granulocytes: 0.03 10*3/uL (ref 0.00–0.07)
Basophils Absolute: 0.1 10*3/uL (ref 0.0–0.1)
Basophils Relative: 1 %
Eosinophils Absolute: 0 10*3/uL (ref 0.0–0.5)
Eosinophils Relative: 0 %
HCT: 34.2 % — ABNORMAL LOW (ref 36.0–46.0)
Hemoglobin: 10.2 g/dL — ABNORMAL LOW (ref 12.0–15.0)
Immature Granulocytes: 0 %
Lymphocytes Relative: 18 %
Lymphs Abs: 1.6 10*3/uL (ref 0.7–4.0)
MCH: 27.2 pg (ref 26.0–34.0)
MCHC: 29.8 g/dL — ABNORMAL LOW (ref 30.0–36.0)
MCV: 91.2 fL (ref 80.0–100.0)
Monocytes Absolute: 0.6 10*3/uL (ref 0.1–1.0)
Monocytes Relative: 6 %
Neutro Abs: 6.5 10*3/uL (ref 1.7–7.7)
Neutrophils Relative %: 75 %
Platelets: 554 10*3/uL — ABNORMAL HIGH (ref 150–400)
RBC: 3.75 MIL/uL — ABNORMAL LOW (ref 3.87–5.11)
RDW: 13 % (ref 11.5–15.5)
WBC: 8.8 10*3/uL (ref 4.0–10.5)
nRBC: 0 % (ref 0.0–0.2)

## 2019-04-27 NOTE — Telephone Encounter (Signed)
Patient will need abtx until sep 2nd.

## 2019-04-29 ENCOUNTER — Ambulatory Visit (INDEPENDENT_AMBULATORY_CARE_PROVIDER_SITE_OTHER): Payer: 59 | Admitting: Internal Medicine

## 2019-04-29 ENCOUNTER — Encounter: Payer: Self-pay | Admitting: Internal Medicine

## 2019-04-29 ENCOUNTER — Other Ambulatory Visit: Payer: Self-pay

## 2019-04-29 DIAGNOSIS — M00062 Staphylococcal arthritis, left knee: Secondary | ICD-10-CM | POA: Diagnosis not present

## 2019-04-29 NOTE — Progress Notes (Signed)
RFV: follow up for MSSA septic arthritis   Patient ID: Rachel Huffman, female   DOB: Jun 08, 1961, 58 y.o.   MRN: [id number]  HPI 58yo F with MSSA septic arthritis of knee had 2nd washout but having still ongoing pain with weight bearing. Inflammatory markers still elevated. No fever, chills, nightsweats.  Outpatient Encounter Medications as of 04/29/2019  Medication Sig  . ceFAZolin (ANCEF) IVPB Inject 2 g into the vein every 8 (eight) hours for 26 days. Indication:  septic joint (Left Knee) Last Day of Therapy:  05/05/2019 Labs - Once weekly:  CBC/D and BMP, ESR and CRP  . celecoxib (CELEBREX) 200 MG capsule Take 1 capsule (200 mg total) by mouth daily.  Marland Kitchen gabapentin (NEURONTIN) 300 MG capsule Take 1 capsule (300 mg total) by mouth 2 (two) times daily.  . pantoprazole (PROTONIX) 40 MG tablet TAKE 1 TABLET BY MOUTH DAILY ABOUT 30 TO 60 MINUTES BEFORE SUPPER (Patient taking differently: Take 40 mg by mouth daily. )  . albuterol (PROVENTIL HFA;VENTOLIN HFA) 108 (90 Base) MCG/ACT inhaler Inhale 2 puffs into the lungs every 6 (six) hours as needed for wheezing or shortness of breath. (Patient not taking: Reported on 04/29/2019)  . cyclobenzaprine (FLEXERIL) 10 MG tablet Take 1 tablet (10 mg total) by mouth 3 (three) times daily as needed for muscle spasms. (Patient not taking: Reported on 04/29/2019)   No facility-administered encounter medications on file as of 04/29/2019.      Patient Active Problem List   Diagnosis Date Noted  . Septic joint of left knee joint ([place]) 04/05/2019  . Osteoarthritis of left knee 03/26/2019  . Gastroesophageal reflux disease 01/08/2018  . Obesity (BMI 30.0-34.9) 01/08/2018  . Menopausal symptom 07/01/2017  . Nasal turbinate hypertrophy 04/07/2017  . Recurrent sinusitis 10/18/2014  . Visit for preventive health examination 06/10/2014  . Hyperlipidemia 06/10/2014  . Controlled restless legs syndrome 05/14/2014  . Medication management 05/14/2014  . Medication  side effect 03/11/2014  . Encounter to establish care with new doctor 03/11/2014  . Cough, persistent 03/11/2014  . Postmenopausal symptoms 06/10/2013  . Right knee pain 06/10/2013  . IBS (irritable bowel syndrome) 06/10/2013  . Right ankle pain 04/06/2012  . Plantar fasciitis of right foot 04/06/2012  . Tendinitis of right ankle 04/06/2012  . RESTLESS LEG SYNDROME 07/24/2009  . ALLERGIC RHINITIS 05/13/2007  . ASTHMA 05/13/2007     Health Maintenance Due  Topic Date Due  . Hepatitis C Screening  30-Nov-1960  . COLONOSCOPY  12/04/2010  . PAP SMEAR-Modifier  09/15/2015  . INFLUENZA VACCINE  04/03/2019     Review of Systems 12 point ros is negative except for knee pain Physical Exam   There were no vitals taken for this visit.   No exam telehealth CBC Lab Results  Component Value Date   WBC 8.8 04/26/2019   RBC 3.75 (L) 04/26/2019   HGB 10.2 (L) 04/26/2019   HCT 34.2 (L) 04/26/2019   PLT 554 (H) 04/26/2019   MCV 91.2 04/26/2019   MCH 27.2 04/26/2019   MCHC 29.8 (L) 04/26/2019   RDW 13.0 04/26/2019   LYMPHSABS 1.6 04/26/2019   MONOABS 0.6 04/26/2019   EOSABS 0.0 04/26/2019    BMET Lab Results  Component Value Date   NA 144 04/26/2019   K 3.2 (L) 04/26/2019   CL 108 04/26/2019   CO2 25 04/26/2019   GLUCOSE 145 (H) 04/26/2019   BUN 9 04/26/2019   CREATININE 0.71 04/26/2019   CALCIUM 9.2 04/26/2019  GFRNONAA >60 04/26/2019   GFRAA >60 04/26/2019      Assessment and Plan  MSSA septic arthritis =  - will call dr Theda Sers - find results from 2nd washout  - will extend abtx by 2 wk -to end on sept 16.  Depending on how it is improving see in person/ 2 wk televisit

## 2019-05-03 ENCOUNTER — Ambulatory Visit: Payer: 59 | Admitting: Internal Medicine

## 2019-05-03 ENCOUNTER — Other Ambulatory Visit (HOSPITAL_COMMUNITY)
Admission: RE | Admit: 2019-05-03 | Discharge: 2019-05-03 | Disposition: A | Discharge status: Home / Self Care | Payer: 59 | Source: Other Acute Inpatient Hospital | Attending: Internal Medicine | Admitting: Internal Medicine

## 2019-05-03 DIAGNOSIS — M00869 Arthritis due to other bacteria, unspecified knee: Secondary | ICD-10-CM | POA: Insufficient documentation

## 2019-05-03 LAB — CBC WITH DIFFERENTIAL/PLATELET
Abs Immature Granulocytes: 0.04 10*3/uL (ref 0.00–0.07)
Basophils Absolute: 0 10*3/uL (ref 0.0–0.1)
Basophils Relative: 0 %
Eosinophils Absolute: 0.2 10*3/uL (ref 0.0–0.5)
Eosinophils Relative: 3 %
HCT: 33.9 % — ABNORMAL LOW (ref 36.0–46.0)
Hemoglobin: 10.5 g/dL — ABNORMAL LOW (ref 12.0–15.0)
Immature Granulocytes: 1 %
Lymphocytes Relative: 21 %
Lymphs Abs: 1.5 10*3/uL (ref 0.7–4.0)
MCH: 27 pg (ref 26.0–34.0)
MCHC: 31 g/dL (ref 30.0–36.0)
MCV: 87.1 fL (ref 80.0–100.0)
Monocytes Absolute: 0.5 10*3/uL (ref 0.1–1.0)
Monocytes Relative: 8 %
Neutro Abs: 4.9 10*3/uL (ref 1.7–7.7)
Neutrophils Relative %: 67 %
Platelets: 387 10*3/uL (ref 150–400)
RBC: 3.89 MIL/uL (ref 3.87–5.11)
RDW: 12.8 % (ref 11.5–15.5)
WBC: 7.2 10*3/uL (ref 4.0–10.5)
nRBC: 0 % (ref 0.0–0.2)

## 2019-05-03 LAB — BASIC METABOLIC PANEL
Anion gap: 12 (ref 5–15)
BUN: 11 mg/dL (ref 6–20)
CO2: 27 mmol/L (ref 22–32)
Calcium: 9.4 mg/dL (ref 8.9–10.3)
Chloride: 103 mmol/L (ref 98–111)
Creatinine, Ser: 0.68 mg/dL (ref 0.44–1.00)
GFR calc Af Amer: >60 mL/min (ref >60)
GFR calc non Af Amer: >60 mL/min (ref >60)
Glucose, Bld: 74 mg/dL (ref 70–99)
Potassium: 3.4 mmol/L — ABNORMAL LOW (ref 3.5–5.1)
Sodium: 142 mmol/L (ref 135–145)

## 2019-05-03 LAB — SEDIMENTATION RATE: Sed Rate: 7 mm/hr (ref 0–22)

## 2019-05-03 LAB — C-REACTIVE PROTEIN: CRP: 10.5 mg/dL — ABNORMAL HIGH (ref <1.0)

## 2019-05-04 ENCOUNTER — Telehealth: Payer: Self-pay

## 2019-05-04 NOTE — Telephone Encounter (Signed)
Patient called office today with questions for Dr. Baxter Flattery regarding antibiotics. Patient states that she feels like there is no improvement still, and would like to know if she could come off current antibiotics. If so she would like to know if their is any alternatives. Patient mentioned in call that she has noticed increase hair loss and would like to know if this is related to current antibiotic.  Patient would also like to know in Dr. Storm Frisk opinion if she still has infection or not. Will route message to MD to advise on patient's concerns. Rachel Huffman

## 2019-05-05 ENCOUNTER — Telehealth: Payer: Self-pay

## 2019-05-05 NOTE — Telephone Encounter (Signed)
Received call from [place] with [facility] regarding end date for antibiotics. LPN reviewed chart and perpatient last office visit with Dr. Baxter Flattery she wants to extend antibiotics through September 16. Verbal orders read back and understood.  Eugenia Mcalpine

## 2019-05-05 NOTE — Telephone Encounter (Signed)
Per Dr. Baxter Flattery called Dr. Theda Sers office to have OR results faxed to our office for review. Left voicemail with office requesting results to be faxed to triage.  Once OR results are reviewed Dr. Baxter Flattery can decide on antibiotic change.  Dr. Theda Sers [facility]  P: [phone] [place] [address], [place]

## 2019-05-12 ENCOUNTER — Telehealth: Payer: Self-pay

## 2019-05-12 NOTE — Telephone Encounter (Signed)
Received call from [facility] ([facility]) stating picc line not drawing back in blood for labs. Home Health nurse was unsuccessful x1 attempt and will come back later today for peripheral stick and labs my be delayed. Per [facility] patient has voiced she does not want cath flo being that she only has one week left on IV antibiotics.  Routing to provider to make aware.  Eugenia Mcalpine

## 2019-05-17 ENCOUNTER — Telehealth: Payer: Self-pay | Admitting: *Deleted

## 2019-05-17 ENCOUNTER — Other Ambulatory Visit (HOSPITAL_COMMUNITY)
Admission: RE | Admit: 2019-05-17 | Discharge: 2019-05-17 | Disposition: A | Discharge status: Home / Self Care | Payer: 59 | Source: Other Acute Inpatient Hospital | Attending: Internal Medicine | Admitting: Internal Medicine

## 2019-05-17 DIAGNOSIS — M00869 Arthritis due to other bacteria, unspecified knee: Secondary | ICD-10-CM | POA: Insufficient documentation

## 2019-05-17 LAB — BASIC METABOLIC PANEL
Anion gap: 13 (ref 5–15)
BUN: 16 mg/dL (ref 6–20)
CO2: 25 mmol/L (ref 22–32)
Calcium: 9.1 mg/dL (ref 8.9–10.3)
Chloride: 103 mmol/L (ref 98–111)
Creatinine, Ser: 0.8 mg/dL (ref 0.44–1.00)
GFR calc Af Amer: >60 mL/min (ref >60)
GFR calc non Af Amer: >60 mL/min (ref >60)
Glucose, Bld: 84 mg/dL (ref 70–99)
Potassium: 3.8 mmol/L (ref 3.5–5.1)
Sodium: 141 mmol/L (ref 135–145)

## 2019-05-17 LAB — CBC WITH DIFFERENTIAL/PLATELET
Abs Immature Granulocytes: 0.04 10*3/uL (ref 0.00–0.07)
Basophils Absolute: 0.1 10*3/uL (ref 0.0–0.1)
Basophils Relative: 1 %
Eosinophils Absolute: 0 10*3/uL (ref 0.0–0.5)
Eosinophils Relative: 0 %
HCT: 36.8 % (ref 36.0–46.0)
Hemoglobin: 11.3 g/dL — ABNORMAL LOW (ref 12.0–15.0)
Immature Granulocytes: 1 %
Lymphocytes Relative: 16 %
Lymphs Abs: 1.2 10*3/uL (ref 0.7–4.0)
MCH: 27.3 pg (ref 26.0–34.0)
MCHC: 30.7 g/dL (ref 30.0–36.0)
MCV: 88.9 fL (ref 80.0–100.0)
Monocytes Absolute: 0.7 10*3/uL (ref 0.1–1.0)
Monocytes Relative: 9 %
Neutro Abs: 5.7 10*3/uL (ref 1.7–7.7)
Neutrophils Relative %: 73 %
Platelets: 276 10*3/uL (ref 150–400)
RBC: 4.14 MIL/uL (ref 3.87–5.11)
RDW: 14.9 % (ref 11.5–15.5)
WBC: 7.6 10*3/uL (ref 4.0–10.5)
nRBC: 0 % (ref 0.0–0.2)

## 2019-05-17 LAB — C-REACTIVE PROTEIN: CRP: 0.9 mg/dL (ref <1.0)

## 2019-05-17 LAB — SEDIMENTATION RATE: Sed Rate: 13 mm/hr (ref 0–22)

## 2019-05-17 NOTE — Telephone Encounter (Signed)
RN relayed verbal order per Dr Baxter Flattery to [facility] to pull PICC after end of treatment 9/16.  Patient will follow up with Dr Baxter Flattery by evisit on 9/17.   Order repeated and verified.  Patient aware. Landis Gandy, RN

## 2019-05-20 ENCOUNTER — Encounter: Payer: Self-pay | Admitting: Internal Medicine

## 2019-05-20 ENCOUNTER — Ambulatory Visit (INDEPENDENT_AMBULATORY_CARE_PROVIDER_SITE_OTHER): Payer: 59 | Admitting: Internal Medicine

## 2019-05-20 ENCOUNTER — Other Ambulatory Visit: Payer: Self-pay

## 2019-05-20 DIAGNOSIS — M00062 Staphylococcal arthritis, left knee: Secondary | ICD-10-CM

## 2019-05-20 MED ORDER — CEPHALEXIN 500 MG PO CAPS: 500.0000 mg | ORAL_CAPSULE | Freq: Four times a day (QID) | ORAL | 0 refills | Status: DC

## 2019-05-20 NOTE — Progress Notes (Signed)
  RFV: septic arthritis of knee -televisit  Patient ID: Rachel Huffman, female   DOB: 1961-08-29, 58 y.o.   MRN: [id number]  HPI 58yo F with MSSA septic arthritis of knee had 2nd washout but having still ongoing pain with weight bearing. Inflammatory markers still elevated.no redness to the knee. No fever, chills, nightsweats.  Getting better. On walker getting better  Outpatient Encounter Medications as of 05/20/2019  Medication Sig  . gabapentin (NEURONTIN) 300 MG capsule Take 300 mg by mouth at bedtime.   . pantoprazole (PROTONIX) 40 MG tablet TAKE 1 TABLET BY MOUTH DAILY ABOUT 30 TO 60 MINUTES BEFORE SUPPER (Patient taking differently: Take 40 mg by mouth daily. )  . traMADol (ULTRAM) 50 MG tablet   . albuterol (PROVENTIL HFA;VENTOLIN HFA) 108 (90 Base) MCG/ACT inhaler Inhale 2 puffs into the lungs every 6 (six) hours as needed for wheezing or shortness of breath. (Patient not taking: Reported on 04/29/2019)  . [DISCONTINUED] gabapentin (NEURONTIN) 300 MG capsule Take 1 capsule (300 mg total) by mouth 2 (two) times daily.   No facility-administered encounter medications on file as of 05/20/2019.      Patient Active Problem List   Diagnosis Date Noted  . Septic joint of left knee joint ([place]) 04/05/2019  . Osteoarthritis of left knee 03/26/2019  . Gastroesophageal reflux disease 01/08/2018  . Obesity (BMI 30.0-34.9) 01/08/2018  . Menopausal symptom 07/01/2017  . Nasal turbinate hypertrophy 04/07/2017  . Recurrent sinusitis 10/18/2014  . Visit for preventive health examination 06/10/2014  . Hyperlipidemia 06/10/2014  . Controlled restless legs syndrome 05/14/2014  . Medication management 05/14/2014  . Medication side effect 03/11/2014  . Encounter to establish care with new doctor 03/11/2014  . Cough, persistent 03/11/2014  . Postmenopausal symptoms 06/10/2013  . Right knee pain 06/10/2013  . IBS (irritable bowel syndrome) 06/10/2013  . Right ankle pain 04/06/2012  . Plantar  fasciitis of right foot 04/06/2012  . Tendinitis of right ankle 04/06/2012  . RESTLESS LEG SYNDROME 07/24/2009  . ALLERGIC RHINITIS 05/13/2007  . ASTHMA 05/13/2007     Health Maintenance Due  Topic Date Due  . Hepatitis C Screening  [birth year]  . COLONOSCOPY  12/04/2010  . PAP SMEAR-Modifier  09/15/2015  . INFLUENZA VACCINE  04/03/2019     Review of Systems Besides knee pain, 12 point ros is otherwise negative Physical Exam   There were no vitals taken for this visit.  No exam since televisit CBC Lab Results  Component Value Date   WBC 7.6 05/17/2019   RBC 4.14 05/17/2019   HGB 11.3 (L) 05/17/2019   HCT 36.8 05/17/2019   PLT 276 05/17/2019   MCV 88.9 05/17/2019   MCH 27.3 05/17/2019   MCHC 30.7 05/17/2019   RDW 14.9 05/17/2019   LYMPHSABS 1.2 05/17/2019   MONOABS 0.7 05/17/2019   EOSABS 0.0 05/17/2019    BMET Lab Results  Component Value Date   NA 141 05/17/2019   K 3.8 05/17/2019   CL 103 05/17/2019   CO2 25 05/17/2019   GLUCOSE 84 05/17/2019   BUN 16 05/17/2019   CREATININE 0.80 05/17/2019   CALCIUM 9.1 05/17/2019   GFRNONAA >60 05/17/2019   GFRAA >60 05/17/2019      Assessment and Plan Septic arthritis-prolonged healing requiring 2nd washout  - will finish out 2 wk of cephalexin   rtc prn

## 2019-05-21 ENCOUNTER — Encounter: Payer: 59 | Admitting: Internal Medicine

## 2019-06-29 ENCOUNTER — Encounter: Payer: 59 | Admitting: Internal Medicine

## 2019-07-07 ENCOUNTER — Inpatient Hospital Stay: Payer: 59 | Admitting: Internal Medicine

## 2019-07-12 ENCOUNTER — Other Ambulatory Visit: Payer: Self-pay

## 2019-07-12 MED ORDER — PANTOPRAZOLE SODIUM 40 MG PO TBEC: DELAYED_RELEASE_TABLET | ORAL | 1 refills | Status: DC

## 2019-07-12 NOTE — Telephone Encounter (Signed)
Pantoprazole refilled. 

## 2019-09-10 ENCOUNTER — Encounter: Payer: 59 | Admitting: Internal Medicine

## 2019-11-24 HISTORY — PX: TOTAL KNEE ARTHROPLASTY: SHX125

## 2019-11-25 MED ORDER — BISACODYL 10 MG RE SUPP
10.00 | RECTAL | Status: DC
Start: ? — End: 2019-11-25

## 2019-11-25 MED ORDER — RIVAROXABAN 10 MG PO TABS
10.00 | ORAL_TABLET | ORAL | Status: DC
Start: 2019-11-26 — End: 2019-11-25

## 2019-11-25 MED ORDER — PANTOPRAZOLE SODIUM 40 MG PO TBEC
40.00 | DELAYED_RELEASE_TABLET | ORAL | Status: DC
Start: 2019-11-26 — End: 2019-11-25

## 2019-11-25 MED ORDER — ONDANSETRON HCL 4 MG/2ML IJ SOLN
4.00 | INTRAMUSCULAR | Status: DC
Start: ? — End: 2019-11-25

## 2019-11-25 MED ORDER — GENERIC EXTERNAL MEDICATION
Status: DC
Start: ? — End: 2019-11-25

## 2019-11-25 MED ORDER — TRAMADOL HCL 50 MG PO TABS
50.00 | ORAL_TABLET | ORAL | Status: DC
Start: ? — End: 2019-11-25

## 2019-11-25 MED ORDER — SODIUM CHLORIDE FLUSH 0.9 % IV SOLN
10.00 | INTRAVENOUS | Status: DC
Start: 2019-11-25 — End: 2019-11-25

## 2019-11-25 MED ORDER — LACTATED RINGERS IV SOLN
INTRAVENOUS | Status: DC
Start: ? — End: 2019-11-25

## 2019-11-25 MED ORDER — NALOXONE HCL 0.4 MG/ML IJ SOLN
0.10 | INTRAMUSCULAR | Status: DC
Start: ? — End: 2019-11-25

## 2019-11-25 MED ORDER — ACETAMINOPHEN 500 MG PO TABS
1000.00 | ORAL_TABLET | ORAL | Status: DC
Start: 2019-11-25 — End: 2019-11-25

## 2019-11-25 MED ORDER — CELECOXIB 200 MG PO CAPS
200.00 | ORAL_CAPSULE | ORAL | Status: DC
Start: 2019-11-25 — End: 2019-11-25

## 2019-11-25 MED ORDER — NALOXONE HCL 0.4 MG/ML IJ SOLN
0.40 | INTRAMUSCULAR | Status: DC
Start: ? — End: 2019-11-25

## 2019-11-25 MED ORDER — ENEMA 7-19 GM/118ML RE ENEM
1.00 | ENEMA | RECTAL | Status: DC
Start: ? — End: 2019-11-25

## 2019-11-25 MED ORDER — SODIUM CHLORIDE FLUSH 0.9 % IV SOLN
10.00 | INTRAVENOUS | Status: DC
Start: ? — End: 2019-11-25

## 2019-11-25 MED ORDER — SENNOSIDES-DOCUSATE SODIUM 8.6-50 MG PO TABS
2.00 | ORAL_TABLET | ORAL | Status: DC
Start: 2019-11-25 — End: 2019-11-25

## 2020-01-04 ENCOUNTER — Other Ambulatory Visit: Payer: Self-pay | Admitting: Dentistry

## 2020-01-04 ENCOUNTER — Other Ambulatory Visit: Payer: Self-pay | Admitting: Internal Medicine

## 2020-01-04 DIAGNOSIS — Z1231 Encounter for screening mammogram for malignant neoplasm of breast: Secondary | ICD-10-CM

## 2020-02-23 ENCOUNTER — Telehealth: Payer: Self-pay | Admitting: Internal Medicine

## 2020-02-23 ENCOUNTER — Encounter: Payer: Self-pay | Admitting: Internal Medicine

## 2020-02-23 MED ORDER — PANTOPRAZOLE SODIUM 40 MG PO TBEC: DELAYED_RELEASE_TABLET | ORAL | 0 refills | Status: DC

## 2020-02-23 NOTE — Telephone Encounter (Signed)
Pt is requesting to rf her Pantoprazole for two more months. She stated that she recently had a knee surgery so is not able to come in for an appt.

## 2020-02-23 NOTE — Telephone Encounter (Signed)
I spoke with her and confirmed pharmacy and made her an appointment for August. She reports having a total knee surgery 11 weeks ago and wants to wait on coming in until then.

## 2020-04-21 ENCOUNTER — Ambulatory Visit: Payer: 59 | Admitting: Internal Medicine

## 2020-09-12 ENCOUNTER — Other Ambulatory Visit: Payer: Self-pay | Admitting: Obstetrics and Gynecology

## 2020-09-12 DIAGNOSIS — Z1231 Encounter for screening mammogram for malignant neoplasm of breast: Secondary | ICD-10-CM

## 2020-09-19 ENCOUNTER — Encounter: Payer: Self-pay | Admitting: Internal Medicine

## 2020-09-19 ENCOUNTER — Telehealth (INDEPENDENT_AMBULATORY_CARE_PROVIDER_SITE_OTHER): Payer: 59 | Admitting: Internal Medicine

## 2020-09-19 DIAGNOSIS — J22 Unspecified acute lower respiratory infection: Secondary | ICD-10-CM | POA: Diagnosis not present

## 2020-09-19 DIAGNOSIS — J019 Acute sinusitis, unspecified: Secondary | ICD-10-CM | POA: Diagnosis not present

## 2020-09-19 MED ORDER — CEFDINIR 300 MG PO CAPS: 300.0000 mg | ORAL_CAPSULE | Freq: Two times a day (BID) | ORAL | 0 refills | Status: DC

## 2020-09-19 NOTE — Progress Notes (Signed)
History of present illness  Facial pain and pressure x 2 weeks. Pain in ears and face is worsening. Facial pain is located under the eyes and middle of cheek bones.Productive cough with green product. Cough has not worsened. Denies fever or chills, no night sweats. Pt states that the cough is worse at night. Pt states that she does have ear pain, bilaterally.   Pt states that she has taken musinex and she is taking ibuprofen and tylenol for the ear pain.    HM  GYN - [facility]

## 2020-09-19 NOTE — Progress Notes (Signed)
Virtual Visit via Video Note  I connected with@ on 09/19/20 at  4:00 PM EST by a video enabled telemedicine application and verified that I am speaking with the correct person using two identifiers. Location patient: home Location provider:home office Persons participating in the virtual visit: patient, provider  [place] national recommendations  regarding COVID 19 pandemic   video visit is advised over in office visit for this patient.  Patient aware  of the limitations of evaluation and management by telemedicine and  availability of in person appointments. and agreed to proceed.   HPI: Rachel Huffman presents for video visit for sinusitis symptoms. About 2-1/2 weeks ago had onset of what was like a head cold runny nose without associated fever and over the last 5 days has been getting to have daily maxillary cheek pressure and tenderness in ethmoid area of the forehead. Thick nasal drainage and cough. She has stayed home the time no testing for COVID lives with husband who is not sick. She states this feels like her typical sinus infections that she has had in the remote past. Has used Flonase in the past not on it currently.  ROS: See pertinent positives and negatives per HPI.  Past Medical History:  Diagnosis Date  . Allergy   . Asthma   . Gallstones   . IBS (irritable bowel syndrome)   . Sinusitis     Past Surgical History:  Procedure Laterality Date  . KNEE ARTHROSCOPY Left 04/06/2019   Procedure: IRRIGATION AND DEBRIDEMENT knee scope;  Surgeon: Eugenia Mcalpine, MD;  Location: [facility];  Service: Orthopedics;  Laterality: Left;  . KNEE SURGERY Left 07/2016  . TOTAL KNEE ARTHROPLASTY Left 11/24/2019    Family History  Problem Relation Age of Onset  . Hyperlipidemia Mother        HT  . Heart disease Father        stents died of cva     Social History   Tobacco Use  . Smoking status: Never Smoker  . Smokeless tobacco: Never Used  Vaping Use  . Vaping Use: Never  used  Substance Use Topics  . Alcohol use: Yes    Comment: ocassionally  . Drug use: No      Current Outpatient Medications:  .  cefdinir (OMNICEF) 300 MG capsule, Take 1 capsule (300 mg total) by mouth 2 (two) times daily. For sinusitis, Disp: 14 capsule, Rfl: 0 .  pantoprazole (PROTONIX) 40 MG tablet, TAKE 1 TABLET BY MOUTH DAILY ABOUT 30 TO 60 MINUTES BEFORE SUPPER, Disp: 90 tablet, Rfl: 0 .  traMADol (ULTRAM) 50 MG tablet, , Disp: , Rfl:   EXAM: BP Readings from Last 3 Encounters:  04/09/19 126/83  09/25/18 (!) 142/80  09/08/18 (!) 144/92    VITALS per patient if applicable:  GENERAL: alert, oriented, appears well and in no acute distress appears mildly congested no respiratory distress during the visit.  HEENT: atraumatic, conjunttiva clear, no obvious abnormalities on inspection of external nose and ears  NECK: normal movements of the head and neck  LUNGS: on inspection no signs of respiratory distress, breathing rate appears normal, no obvious gross SOB, gasping or wheezing  CV: no obvious cyanosis  PSYCH/NEURO: pleasant and cooperative, no obvious depression or anxiety, speech and thought processing grossly intact Lab Results  Component Value Date   WBC 7.6 05/17/2019   HGB 11.3 (L) 05/17/2019   HCT 36.8 05/17/2019   PLT 276 05/17/2019   GLUCOSE 84 05/17/2019   CHOL 240 (H)  09/23/2018   TRIG 167.0 (H) 09/23/2018   HDL 67.10 09/23/2018   LDLCALC 139 (H) 09/23/2018   ALT 21 03/03/2018   AST 18 03/03/2018   NA 141 05/17/2019   K 3.8 05/17/2019   CL 103 05/17/2019   CREATININE 0.80 05/17/2019   BUN 16 05/17/2019   CO2 25 05/17/2019   TSH 1.87 03/03/2018   HGBA1C 5.9 09/23/2018    ASSESSMENT AND PLAN:  Discussed the following assessment and plan:    ICD-10-CM   1. Acute sinusitis with symptoms greater than 10 days  J01.90   2. Acute respiratory infection  J22    Appears like complicated viral respiratory infection.  Options discussed tends to get GI  upset with Augmentin will use Omnicef. Add saline and Flonase can use Mucinex DM for cough Expectant management Can consider getting COVID tested but if it doing appropriate isolation and no other symptoms not compelling.  Counseled.   Expectant management and discussion of plan and treatment with opportunity to ask questions and all were answered. The patient agreed with the plan and demonstrated an understanding of the instructions.   Advised to call back or seek an in-person evaluation if worsening  or having  further concerns . Return if symptoms worsen or fail to improve as expected.   Berniece Andreas, MD

## 2020-09-19 NOTE — Telephone Encounter (Signed)
This message was from 3 days ago .  Get her on my schedule today this afternoon  for virtual to help with further advice .  Can add on at 430 or 1 pm if other slots  Filled.

## 2020-09-21 ENCOUNTER — Telehealth: Payer: Self-pay | Admitting: Internal Medicine

## 2020-09-21 MED ORDER — AMOXICILLIN 500 MG PO CAPS: 500.0000 mg | ORAL_CAPSULE | Freq: Three times a day (TID) | ORAL | 0 refills | Status: DC

## 2020-09-21 MED ORDER — ONDANSETRON 4 MG PO TBDP: 4.0000 mg | ORAL_TABLET | Freq: Three times a day (TID) | ORAL | 0 refills | Status: DC | PRN

## 2020-09-21 NOTE — Telephone Encounter (Signed)
Patient informed-additional note sent to PCP requesting anti-nausea medication.

## 2020-09-21 NOTE — Telephone Encounter (Signed)
Patient states the Cefdinir is causing diarrhea.  She would like Dr. Fabian Sharp to prescribe her something else.   Please advise.

## 2020-09-21 NOTE — Telephone Encounter (Signed)
zofran sent in

## 2020-09-21 NOTE — Telephone Encounter (Signed)
Changed the med already see other notes  Can add probiotic  To see if helps the stomach sx

## 2020-09-21 NOTE — Telephone Encounter (Signed)
Patient informed of the message below and stated she would like to have a medication for nausea sent in also.  Message sent to PCP.

## 2020-09-21 NOTE — Telephone Encounter (Signed)
See prior note

## 2020-09-21 NOTE — Telephone Encounter (Signed)
That is not a common side effect but   We can change to  amox . Sent in   Take meds with food  (the sinusitis can also cause  dizziness and head ache ) Do you want to have antinausea medicine such as  zofram send in also

## 2020-09-21 NOTE — Telephone Encounter (Signed)
Patient informed of the message below.

## 2020-09-21 NOTE — Telephone Encounter (Signed)
please see other message   amoxicilliin sent in to pharmacy

## 2020-09-25 NOTE — Progress Notes (Deleted)
No chief complaint on file.   HPI: Patient  Rachel Huffman  60 y.o. comes in today for Preventive Health Care visit   Health Maintenance  Topic Date Due  . Hepatitis C Screening  Never done  . COVID-19 Vaccine (1) Never done  . COLONOSCOPY (Pts 45-68yrs Insurance coverage will need to be confirmed)  Never done  . PAP SMEAR-Modifier  09/15/2015  . MAMMOGRAM  11/11/2019  . INFLUENZA VACCINE  11/30/2020 (Originally 04/02/2020)  . TETANUS/TDAP  03/23/2024  . HIV Screening  Completed   Health Maintenance Review LIFESTYLE:  Exercise:   Tobacco/ETS: Alcohol:  Sugar beverages: Sleep: Drug use: no HH of  Work:    ROS:  GEN/ HEENT: No fever, significant weight changes sweats headaches vision problems hearing changes, CV/ PULM; No chest pain shortness of breath cough, syncope,edema  change in exercise tolerance. GI /GU: No adominal pain, vomiting, change in bowel habits. No blood in the stool. No significant GU symptoms. SKIN/HEME: ,no acute skin rashes suspicious lesions or bleeding. No lymphadenopathy, nodules, masses.  NEURO/ PSYCH:  No neurologic signs such as weakness numbness. No depression anxiety. IMM/ Allergy: No unusual infections.  Allergy .   REST of 12 system review negative except as per HPI   Past Medical History:  Diagnosis Date  . Allergy   . Asthma   . Gallstones   . IBS (irritable bowel syndrome)   . Sinusitis     Past Surgical History:  Procedure Laterality Date  . KNEE ARTHROSCOPY Left 04/06/2019   Procedure: IRRIGATION AND DEBRIDEMENT knee scope;  Surgeon: Eugenia Mcalpine, MD;  Location: [facility];  Service: Orthopedics;  Laterality: Left;  . KNEE SURGERY Left 07/2016  . TOTAL KNEE ARTHROPLASTY Left 11/24/2019    Family History  Problem Relation Age of Onset  . Hyperlipidemia Mother        HT  . Heart disease Father        stents died of cva     Social History   Socioeconomic History  . Marital status: Married    Spouse name: Not on file   . Number of children: 2  . Years of education: Not on file  . Highest education level: Not on file  Occupational History  . Occupation: Homemaker  Tobacco Use  . Smoking status: Never Smoker  . Smokeless tobacco: Never Used  Vaping Use  . Vaping Use: Never used  Substance and Sexual Activity  . Alcohol use: Yes    Comment: ocassionally  . Drug use: No  . Sexual activity: Yes    Partners: Male  Other Topics Concern  . Not on file  Social History Narrative   orig fr [place]   hh of 2    1 pet dog.   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 amoxicillin (AMOXIL) 500 MG capsule Take 1 capsule (500 mg total) by mouth 3 (three) times daily. 21 capsule 0  . ondansetron (ZOFRAN-ODT) 4 MG disintegrating tablet Take 1 tablet (4  mg total) by mouth every 8 (eight) hours as needed for nausea or vomiting. 20 tablet 0  . pantoprazole (PROTONIX) 40 MG tablet TAKE 1 TABLET BY MOUTH DAILY ABOUT 30 TO 60 MINUTES BEFORE SUPPER 90 tablet 0  . traMADol (ULTRAM) 50 MG tablet      No facility-administered medications prior to visit.     EXAM:  There were no vitals taken for this visit.  There is no height or weight on file to calculate BMI. Wt Readings from Last 3 Encounters:  04/06/19 210 lb (95.3 kg)  09/25/18 214 lb 11.2 oz (97.4 kg)  09/08/18 214 lb 12.8 oz (97.4 kg)    Physical Exam: Vital signs reviewed [id number] is a well-developed well-nourished alert cooperative    who appearsr stated age in no acute distress.  HEENT: normocephalic atraumatic , Eyes:  PERRL EOM's full, conjunctiva clear, Nares: paten,t no deformity discharge or tenderness., Ears: no deformity EAC's clear TMs with normal landmarks. Mouth: clear OP, no lesions, edema.  Moist mucous membranes. Dentition in adequate repair. NECK: supple without masses, thyromegaly or bruits. CHEST/PULM:  Clear to auscultation and percussion breath sounds equal no wheeze , rales or rhonchi. No chest wall deformities or tenderness. Breast: normal by inspection . No dimpling, discharge, masses, tenderness or discharge . CV: PMI is nondisplaced, S1 S2 no gallops, murmurs, rubs. Peripheral pulses are full without delay.No JVD .  ABDOMEN: Bowel sounds normal nontender  No guard or rebound, no hepato splenomegal no CVA tenderness.  No hernia. Extremtities:  No clubbing cyanosis or edema, no acute joint swelling or redness no focal atrophy NEURO:  Oriented x3, cranial nerves 3-12 appear to be intact, no obvious focal weakness,gait within normal limits no abnormal reflexes or asymmetrical SKIN: No acute rashes normal turgor, color, no bruising or petechiae. PSYCH: Oriented, good eye contact, no obvious depression anxiety, cognition and judgment appear normal. LN: no cervical axillary inguinal adenopathy  Lab Results  Component Value Date   WBC 7.6 05/17/2019   HGB 11.3 (L) 05/17/2019   HCT 36.8 05/17/2019   PLT 276 05/17/2019   GLUCOSE 84 05/17/2019   CHOL 240 (H) 09/23/2018   TRIG 167.0 (H) 09/23/2018   HDL 67.10 09/23/2018   LDLCALC 139 (H) 09/23/2018   ALT 21 03/03/2018   AST 18 03/03/2018   NA 141 05/17/2019   K 3.8 05/17/2019   CL 103 05/17/2019   CREATININE 0.80 05/17/2019   BUN 16 05/17/2019   CO2 25 05/17/2019   TSH 1.87 03/03/2018   HGBA1C 5.9 09/23/2018    BP Readings from Last 3 Encounters:  04/09/19 126/83  09/25/18 (!) 142/80  09/08/18 (!) 144/92    Lab results reviewed with patient   ASSESSMENT AND PLAN:  Discussed the following assessment and plan:    ICD-10-CM    1. Visit for preventive health examination  Z00.00   2. Medication management  Z79.899    No follow-ups on file.  Patient Care Team: Kashmir Lysaght, Neta Mends, MD as PCP - General (Internal Medicine) Philip Aspen, DO as Consulting Physician (Obstetrics and Gynecology) There are no Patient Instructions on file for this visit.  Neta Mends. Julies Carmickle M.D.

## 2020-09-26 ENCOUNTER — Encounter: Payer: 59 | Admitting: Internal Medicine

## 2020-09-26 DIAGNOSIS — Z0000 Encounter for general adult medical examination without abnormal findings: Secondary | ICD-10-CM

## 2020-09-26 DIAGNOSIS — Z79899 Other long term (current) drug therapy: Secondary | ICD-10-CM

## 2020-10-30 ENCOUNTER — Ambulatory Visit: Payer: 59

## 2020-10-31 NOTE — Progress Notes (Signed)
Chief Complaint  Patient presents with  . Annual Exam    HPI: Patient  Rachel Huffman  60 y.o. comes in today for Preventive Health Care visit  She says she is pretty much recovered from her left knee replacement and secondary infection is now walking miles with minimal soreness later but is requiring tramadol up to 5+ times a day under care by Dr. Modesta Messing. Has lost weight over the last few months with this exercise no new conditions. She is had a Pap done recently Covid vaccine colonoscopy is on her list to get back with Dr. Leone Payor. Mammogram. She has been using Protonix intermittently for occasional heartburn or reflux symptoms but has run out and refills  denied until she gets back to see Dr. Leone Payor for follow-up appointment. Requests medication in interim.  Health Maintenance  Topic Date Due  . Hepatitis C Screening  Never done  . COLONOSCOPY (Pts 45-77yrs Insurance coverage will need to be confirmed)  Never done  . PAP SMEAR-Modifier  09/15/2015  . MAMMOGRAM  11/11/2019  . COVID-19 Vaccine (1) 11/17/2020 (Originally 12/03/1965)  . INFLUENZA VACCINE  11/30/2020 (Originally 04/02/2020)  . TETANUS/TDAP  03/23/2024  . HIV Screening  Completed  . 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 GI /GU: No adominal pain, vomiting, change in bowel habits. No blood in the stool. No significant GU symptoms. SKIN/HEME: ,no acute skin rashes suspicious lesions or bleeding. No lymphadenopathy, nodules, masses.  NEURO/ PSYCH:  No neurologic signs such as weakness numbness. No depression anxiety. IMM/ Allergy: No  unusual infections.  Allergy .   REST of 12 system review negative except as per HPI   Past Medical History:  Diagnosis Date  . Allergy   . Asthma   . Gallstones   . IBS (irritable bowel syndrome)   . Sinusitis     Past Surgical History:  Procedure Laterality Date  . KNEE ARTHROSCOPY Left 04/06/2019   Procedure: IRRIGATION AND DEBRIDEMENT knee scope;  Surgeon: Eugenia Mcalpine, MD;  Location: [facility];  Service: Orthopedics;  Laterality: Left;  . KNEE SURGERY Left 07/2016  . TOTAL KNEE ARTHROPLASTY Left 11/24/2019    Family History  Problem Relation Age of Onset  . Hyperlipidemia Mother        HT  . Heart disease Father        stents died of cva     Social History   Socioeconomic History  . Marital status: Married    Spouse name: Not on file  . Number of children: 2  . Years of education: Not on file  . Highest education level: Not on file  Occupational History  . Occupation: Homemaker  Tobacco Use  . Smoking status: Never Smoker  . Smokeless tobacco: Never Used  Vaping Use  . Vaping Use: Never used  Substance and Sexual Activity  . Alcohol use: Yes    Comment: ocassionally  . Drug use: No  . Sexual activity: Yes    Partners: Male  Other Topics Concern  . Not on file  Social History Narrative   orig fr [place]   hh of 2  1 pet dog.   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 pantoprazole (PROTONIX) 40 MG tablet TAKE 1 TABLET BY MOUTH DAILY ABOUT 30 TO 60 MINUTES BEFORE SUPPER 90 tablet 0  . traMADol (ULTRAM) 50 MG tablet     .  amoxicillin (AMOXIL) 500 MG capsule Take 1 capsule (500 mg total) by mouth 3 (three) times daily. 21 capsule 0  . ondansetron (ZOFRAN-ODT) 4 MG disintegrating tablet Take 1 tablet (4 mg total) by mouth every 8 (eight) hours as needed for nausea or vomiting. 20 tablet 0   No facility-administered medications prior to visit.     EXAM:  BP 136/84   Pulse 80   Resp 18   Ht 5\' 7"  (1.702 m)   Wt 205 lb 12.8 oz (93.4 kg)   SpO2 98%   BMI 32.23 kg/m   Body mass index is 32.23 kg/m. Wt Readings from Last 3 Encounters:  11/01/20 205 lb 12.8 oz (93.4 kg)  04/06/19 210 lb (95.3 kg)  09/25/18 214 lb 11.2 oz (97.4 kg)    Physical Exam: Vital signs reviewed 09/27/18 is a well-developed well-nourished alert cooperative    who appearsr stated age in no acute distress.  HEENT: normocephalic atraumatic , Eyes: PERRL EOM's full, conjunctiva clear, Nares: paten,t no deformity discharge or tenderness., Ears: no deformity EAC's clear TMs with normal landmarks. Mouth: Masked NECK: supple without masses, thyromegaly or bruits. CHEST/PULM:  Clear to auscultation and percussion breath sounds equal no wheeze , rales or rhonchi. No chest wall deformities or tenderness. Breast: normal by inspection . No dimpling, discharge, masses, tenderness or discharge . CV: PMI is nondisplaced, S1 S2 no gallops, murmurs, rubs. Peripheral pulses are full without delay.No JVD .  ABDOMEN: Bowel sounds normal nontender  No guard or rebound, no hepato splenomegal no CVA tenderness.  No hernia. Extremtities:  No clubbing cyanosis or edema, no acute joint swelling or redness no focal atrophy left knee scar well-healed no acute swelling or redness grossly normal gait NEURO:  Oriented x3, cranial nerves 3-12 appear to be intact, no obvious focal weakness,gait within normal limits no abnormal reflexes or asymmetrical SKIN: No acute rashes normal turgor, color, no bruising or petechiae. PSYCH: Oriented, good eye contact, no obvious  depression anxiety, cognition and judgment appear normal. LN: no cervical axillary iadenopathy  Lab Results  Component Value Date   WBC 7.6 05/17/2019   HGB 11.3 (L) 05/17/2019   HCT 36.8 05/17/2019   PLT 276 05/17/2019   GLUCOSE 84 05/17/2019   CHOL 240 (H) 09/23/2018   TRIG 167.0 (H) 09/23/2018   HDL 67.10 09/23/2018   LDLCALC 139 (H) 09/23/2018   ALT 21 03/03/2018   AST 18 03/03/2018   NA 141 05/17/2019   K 3.8 05/17/2019   CL 103 05/17/2019   CREATININE 0.80 05/17/2019   BUN 16 05/17/2019   CO2 25 05/17/2019   TSH 1.87 03/03/2018   HGBA1C 5.9 09/23/2018    BP Readings from Last 3 Encounters:  11/01/20  136/84  04/09/19 126/83  09/25/18 (!) 142/80    Labr plan reviewed with patient   ASSESSMENT AND PLAN:  Discussed the following assessment and plan:    ICD-10-CM   1. Visit for preventive health examination  Z00.00 Basic metabolic panel    CBC with Differential/Platelet    Hepatic function panel    Lipid panel    TSH    Hemoglobin A1c    Hepatitis C antibody    Basic metabolic panel    CBC with Differential/Platelet    Hepatic function panel    Lipid panel    TSH    Hemoglobin A1c    Hepatitis C antibody  2. Medication management  Z79.899 Basic metabolic panel    CBC with Differential/Platelet    Hepatic function panel    Lipid panel    TSH    Hemoglobin A1c    Hepatitis C antibody    Basic metabolic panel    CBC with Differential/Platelet    Hepatic function panel    Lipid panel    TSH    Hemoglobin A1c    Hepatitis C antibody  3. Hyperlipidemia, unspecified hyperlipidemia type  E78.5 Basic metabolic panel    CBC with Differential/Platelet    Hepatic function panel    Lipid panel    TSH    Hemoglobin A1c    Hepatitis C antibody    Basic metabolic panel    CBC with Differential/Platelet    Hepatic function panel    Lipid panel    TSH    Hemoglobin A1c    Hepatitis C antibody  4. Gastroesophageal reflux disease, unspecified whether  esophagitis present  K21.9 Basic metabolic panel    CBC with Differential/Platelet    Hepatic function panel    Lipid panel    TSH    Hemoglobin A1c    Hepatitis C antibody    Basic metabolic panel    CBC with Differential/Platelet    Hepatic function panel    Lipid panel    TSH    Hemoglobin A1c    Hepatitis C antibody  5. History of left knee replacement  Z96.652   Itching up on her healthcare maintenance We will refill 30 pills of Protonix to use as needed further refills should be managed by Dr. Leone Payor or advice. She is not taking it daily. Under pain management and rehab Dr. Horald Chestnut. Return in about 1 year (around 11/01/2021) for depending on results, preventive /cpx and medications.  Patient Care Team: Madelin Headings, MD as PCP - General (Internal Medicine) Philip Aspen, DO as Consulting Physician (Obstetrics and Gynecology) Iva Boop, MD as Consulting Physician (Gastroenterology) Sheran Luz, MD as Consulting Physician (Physical Medicine and Rehabilitation) Patient Instructions   Continue healthy lifestyle as possible Lab results when available. We will refill Protonix 30 until you can get back with Dr. Leone Payor. Continued healthy weight loss will help your overall health and conditions.    Health Maintenance, Female Adopting a healthy lifestyle and getting preventive care are important in promoting health and wellness. Ask your health care provider about:  The right schedule for you to have regular tests and exams.  Things you can do on your own to prevent diseases and keep yourself healthy. What should I know about diet, weight, and exercise? Eat a healthy diet  Eat a diet that includes plenty of vegetables, fruits, low-fat dairy products, and lean protein.  Do not eat a lot of foods that are high in solid  fats, added sugars, or sodium.   Maintain a healthy weight Body mass index (BMI) is used to identify weight problems. It estimates body fat based  on height and weight. Your health care provider can help determine your BMI and help you achieve or maintain a healthy weight. Get regular exercise Get regular exercise. This is one of the most important things you can do for your health. Most adults should:  Exercise for at least 150 minutes each week. The exercise should increase your heart rate and make you sweat (moderate-intensity exercise).  Do strengthening exercises at least twice a week. This is in addition to the moderate-intensity exercise.  Spend less time sitting. Even light physical activity can be beneficial. Watch cholesterol and blood lipids Have your blood tested for lipids and cholesterol at 10420 years of age, then have this test every 5 years. Have your cholesterol levels checked more often if:  Your lipid or cholesterol levels are high.  You are older than 60 years of age.  You are at high risk for heart disease. What should I know about cancer screening? Depending on your health history and family history, you may need to have cancer screening at various ages. This may include screening for:  Breast cancer.  Cervical cancer.  Colorectal cancer.  Skin cancer.  Lung cancer. What should I know about heart disease, diabetes, and high blood pressure? Blood pressure and heart disease  High blood pressure causes heart disease and increases the risk of stroke. This is more likely to develop in people who have high blood pressure readings, are of African descent, or are overweight.  Have your blood pressure checked: ? Every 3-5 years if you are 5818-60 years of age. ? Every year if you are 796 years old or older. Diabetes Have regular diabetes screenings. This checks your fasting blood sugar level. Have the screening done:  Once every three years after age 60 if you are at a normal weight and have a low risk for diabetes.  More often and at a younger age if you are overweight or have a high risk for diabetes. What  should I know about preventing infection? Hepatitis B If you have a higher risk for hepatitis B, you should be screened for this virus. Talk with your health care provider to find out if you are at risk for hepatitis B infection. Hepatitis C Testing is recommended for:  Everyone born from [id number] through 1965.  Anyone with known risk factors for hepatitis C. Sexually transmitted infections (STIs)  Get screened for STIs, including gonorrhea and chlamydia, if: ? You are sexually active and are younger than 60 years of age. ? You are older than 60 years of age and your health care provider tells you that you are at risk for this type of infection. ? Your sexual activity has changed since you were last screened, and you are at increased risk for chlamydia or gonorrhea. Ask your health care provider if you are at risk.  Ask your health care provider about whether you are at high risk for HIV. Your health care provider may recommend a prescription medicine to help prevent HIV infection. If you choose to take medicine to prevent HIV, you should first get tested for HIV. You should then be tested every 3 months for as long as you are taking the medicine. Pregnancy  If you are about to stop having your period (premenopausal) and you may become pregnant, seek counseling before you get pregnant.  Take  400 to 800 micrograms (mcg) of folic acid every day if you become pregnant.  Ask for birth control (contraception) if you want to prevent pregnancy. Osteoporosis and menopause Osteoporosis is a disease in which the bones lose minerals and strength with aging. This can result in bone fractures. If you are 67 years old or older, or if you are at risk for osteoporosis and fractures, ask your health care provider if you should:  Be screened for bone loss.  Take a calcium or vitamin D supplement to lower your risk of fractures.  Be given hormone replacement therapy (HRT) to treat symptoms of  menopause. Follow these instructions at home: Lifestyle  Do not use any products that contain nicotine or tobacco, such as cigarettes, e-cigarettes, and chewing tobacco. If you need help quitting, ask your health care provider.  Do not use street drugs.  Do not share needles.  Ask your health care provider for help if you need support or information about quitting drugs. Alcohol use  Do not drink alcohol if: ? Your health care provider tells you not to drink. ? You are pregnant, may be pregnant, or are planning to become pregnant.  If you drink alcohol: ? Limit how much you use to 0-1 drink a day. ? Limit intake if you are breastfeeding.  Be aware of how much alcohol is in your drink. In the [place] one drink equals one 12 oz bottle of beer (355 mL), one 5 oz glass of wine (148 mL), or one 1 oz glass of hard liquor (44 mL). General instructions  Schedule regular health, dental, and eye exams.  Stay current with your vaccines.  Tell your health care provider if: ? You often feel depressed. ? You have ever been abused or do not feel safe at home. Summary  Adopting a healthy lifestyle and getting preventive care are important in promoting health and wellness.  Follow your health care provider's instructions about healthy diet, exercising, and getting tested or screened for diseases.  Follow your health care provider's instructions on monitoring your cholesterol and blood pressure. This information is not intended to replace advice given to you by your health care provider. Make sure you discuss any questions you have with your health care provider. Document Revised: 08/12/2018 Document Reviewed: 08/12/2018 Elsevier Patient Education  2021 [employer].      [place]. Kinzey Sheriff M.D.

## 2020-11-01 ENCOUNTER — Encounter: Payer: Self-pay | Admitting: Internal Medicine

## 2020-11-01 ENCOUNTER — Other Ambulatory Visit: Payer: Self-pay

## 2020-11-01 ENCOUNTER — Ambulatory Visit (INDEPENDENT_AMBULATORY_CARE_PROVIDER_SITE_OTHER): Payer: 59 | Admitting: Internal Medicine

## 2020-11-01 VITALS — BP 136/84 | HR 80 | Resp 18 | Ht 67.0 in | Wt 205.8 lb

## 2020-11-01 DIAGNOSIS — Z79899 Other long term (current) drug therapy: Secondary | ICD-10-CM | POA: Diagnosis not present

## 2020-11-01 DIAGNOSIS — E785 Hyperlipidemia, unspecified: Secondary | ICD-10-CM

## 2020-11-01 DIAGNOSIS — Z96652 Presence of left artificial knee joint: Secondary | ICD-10-CM | POA: Diagnosis not present

## 2020-11-01 DIAGNOSIS — Z0000 Encounter for general adult medical examination without abnormal findings: Secondary | ICD-10-CM

## 2020-11-01 DIAGNOSIS — K219 Gastro-esophageal reflux disease without esophagitis: Secondary | ICD-10-CM

## 2020-11-01 LAB — CBC WITH DIFFERENTIAL/PLATELET
Basophils Absolute: 0.1 10*3/uL (ref 0.0–0.1)
Basophils Relative: 1.8 % (ref 0.0–3.0)
Eosinophils Absolute: 0.2 10*3/uL (ref 0.0–0.7)
Eosinophils Relative: 3.6 % (ref 0.0–5.0)
HCT: 40.8 % (ref 36.0–46.0)
Hemoglobin: 13.5 g/dL (ref 12.0–15.0)
Lymphocytes Relative: 23.9 % (ref 12.0–46.0)
Lymphs Abs: 1.6 10*3/uL (ref 0.7–4.0)
MCHC: 33.1 g/dL (ref 30.0–36.0)
MCV: 84.1 fl (ref 78.0–100.0)
Monocytes Absolute: 0.4 10*3/uL (ref 0.1–1.0)
Monocytes Relative: 6.4 % (ref 3.0–12.0)
Neutro Abs: 4.3 10*3/uL (ref 1.4–7.7)
Neutrophils Relative %: 64.3 % (ref 43.0–77.0)
Platelets: 326 10*3/uL (ref 150.0–400.0)
RBC: 4.85 Mil/uL (ref 3.87–5.11)
RDW: 13.9 % (ref 11.5–15.5)
WBC: 6.6 10*3/uL (ref 4.0–10.5)

## 2020-11-01 LAB — HEPATIC FUNCTION PANEL
ALT: 16 U/L (ref 0–35)
AST: 18 U/L (ref 0–37)
Albumin: 4.2 g/dL (ref 3.5–5.2)
Alkaline Phosphatase: 117 U/L (ref 39–117)
Bilirubin, Direct: 0.1 mg/dL (ref 0.0–0.3)
Total Bilirubin: 0.7 mg/dL (ref 0.2–1.2)
Total Protein: 6.7 g/dL (ref 6.0–8.3)

## 2020-11-01 LAB — LIPID PANEL
Cholesterol: 247 mg/dL — ABNORMAL HIGH (ref 0–200)
HDL: 61.9 mg/dL (ref >39.00)
LDL Cholesterol: 150 mg/dL — ABNORMAL HIGH (ref 0–99)
NonHDL: 184.96
Total CHOL/HDL Ratio: 4
Triglycerides: 174 mg/dL — ABNORMAL HIGH (ref 0.0–149.0)
VLDL: 34.8 mg/dL (ref 0.0–40.0)

## 2020-11-01 LAB — BASIC METABOLIC PANEL
BUN: 18 mg/dL (ref 6–23)
CO2: 29 mEq/L (ref 19–32)
Calcium: 10 mg/dL (ref 8.4–10.5)
Chloride: 104 mEq/L (ref 96–112)
Creatinine, Ser: 0.88 mg/dL (ref 0.40–1.20)
GFR: 71.69 mL/min (ref >60.00)
Glucose, Bld: 100 mg/dL — ABNORMAL HIGH (ref 70–99)
Potassium: 4.6 mEq/L (ref 3.5–5.1)
Sodium: 137 mEq/L (ref 135–145)

## 2020-11-01 LAB — HEMOGLOBIN A1C: Hgb A1c MFr Bld: 5.7 % (ref 4.6–6.5)

## 2020-11-01 MED ORDER — PANTOPRAZOLE SODIUM 40 MG PO TBEC: 40.0000 mg | DELAYED_RELEASE_TABLET | Freq: Every day | ORAL | 0 refills | Status: DC

## 2020-11-01 NOTE — Patient Instructions (Signed)
Continue healthy lifestyle as possible Lab results when available. We will refill Protonix 30 until you can get back with Dr. Leone Payor. Continued healthy weight loss will help your overall health and conditions.    Health Maintenance, Female Adopting a healthy lifestyle and getting preventive care are important in promoting health and wellness. Ask your health care provider about:  The right schedule for you to have regular tests and exams.  Things you can do on your own to prevent diseases and keep yourself healthy. What should I know about diet, weight, and exercise? Eat a healthy diet  Eat a diet that includes plenty of vegetables, fruits, low-fat dairy products, and lean protein.  Do not eat a lot of foods that are high in solid fats, added sugars, or sodium.   Maintain a healthy weight Body mass index (BMI) is used to identify weight problems. It estimates body fat based on height and weight. Your health care provider can help determine your BMI and help you achieve or maintain a healthy weight. Get regular exercise Get regular exercise. This is one of the most important things you can do for your health. Most adults should:  Exercise for at least 150 minutes each week. The exercise should increase your heart rate and make you sweat (moderate-intensity exercise).  Do strengthening exercises at least twice a week. This is in addition to the moderate-intensity exercise.  Spend less time sitting. Even light physical activity can be beneficial. Watch cholesterol and blood lipids Have your blood tested for lipids and cholesterol at 60 years of age, then have this test every 5 years. Have your cholesterol levels checked more often if:  Your lipid or cholesterol levels are high.  You are older than 60 years of age.  You are at high risk for heart disease. What should I know about cancer screening? Depending on your health history and family history, you may need to have cancer  screening at various ages. This may include screening for:  Breast cancer.  Cervical cancer.  Colorectal cancer.  Skin cancer.  Lung cancer. What should I know about heart disease, diabetes, and high blood pressure? Blood pressure and heart disease  High blood pressure causes heart disease and increases the risk of stroke. This is more likely to develop in people who have high blood pressure readings, are of African descent, or are overweight.  Have your blood pressure checked: ? Every 3-5 years if you are 23-16 years of age. ? Every year if you are 68 years old or older. Diabetes Have regular diabetes screenings. This checks your fasting blood sugar level. Have the screening done:  Once every three years after age 62 if you are at a normal weight and have a low risk for diabetes.  More often and at a younger age if you are overweight or have a high risk for diabetes. What should I know about preventing infection? Hepatitis B If you have a higher risk for hepatitis B, you should be screened for this virus. Talk with your health care provider to find out if you are at risk for hepatitis B infection. Hepatitis C Testing is recommended for:  Everyone born from [birth year] through 1965.  Anyone with known risk factors for hepatitis C. Sexually transmitted infections (STIs)  Get screened for STIs, including gonorrhea and chlamydia, if: ? You are sexually active and are younger than 60 years of age. ? You are older than 60 years of age and your health care provider tells you  that you are at risk for this type of infection. ? Your sexual activity has changed since you were last screened, and you are at increased risk for chlamydia or gonorrhea. Ask your health care provider if you are at risk.  Ask your health care provider about whether you are at high risk for HIV. Your health care provider may recommend a prescription medicine to help prevent HIV infection. If you choose to take  medicine to prevent HIV, you should first get tested for HIV. You should then be tested every 3 months for as long as you are taking the medicine. Pregnancy  If you are about to stop having your period (premenopausal) and you may become pregnant, seek counseling before you get pregnant.  Take 400 to 800 micrograms (mcg) of folic acid every day if you become pregnant.  Ask for birth control (contraception) if you want to prevent pregnancy. Osteoporosis and menopause Osteoporosis is a disease in which the bones lose minerals and strength with aging. This can result in bone fractures. If you are 77 years old or older, or if you are at risk for osteoporosis and fractures, ask your health care provider if you should:  Be screened for bone loss.  Take a calcium or vitamin D supplement to lower your risk of fractures.  Be given hormone replacement therapy (HRT) to treat symptoms of menopause. Follow these instructions at home: Lifestyle  Do not use any products that contain nicotine or tobacco, such as cigarettes, e-cigarettes, and chewing tobacco. If you need help quitting, ask your health care provider.  Do not use street drugs.  Do not share needles.  Ask your health care provider for help if you need support or information about quitting drugs. Alcohol use  Do not drink alcohol if: ? Your health care provider tells you not to drink. ? You are pregnant, may be pregnant, or are planning to become pregnant.  If you drink alcohol: ? Limit how much you use to 0-1 drink a day. ? Limit intake if you are breastfeeding.  Be aware of how much alcohol is in your drink. In the [place] one drink equals one 12 oz bottle of beer (355 mL), one 5 oz glass of wine (148 mL), or one 1 oz glass of hard liquor (44 mL). General instructions  Schedule regular health, dental, and eye exams.  Stay current with your vaccines.  Tell your health care provider if: ? You often feel depressed. ? You have  ever been abused or do not feel safe at home. Summary  Adopting a healthy lifestyle and getting preventive care are important in promoting health and wellness.  Follow your health care provider's instructions about healthy diet, exercising, and getting tested or screened for diseases.  Follow your health care provider's instructions on monitoring your cholesterol and blood pressure. This information is not intended to replace advice given to you by your health care provider. Make sure you discuss any questions you have with your health care provider. Document Revised: 08/12/2018 Document Reviewed: 08/12/2018 Elsevier Patient Education  2021 [employer].

## 2020-11-02 LAB — TSH: TSH: 1.89 u[IU]/mL (ref 0.35–4.50)

## 2020-11-02 LAB — HEPATITIS C ANTIBODY
Hepatitis C Ab: NONREACTIVE
SIGNAL TO CUT-OFF: 0 (ref <1.00)

## 2020-11-02 NOTE — Progress Notes (Signed)
Lab results okay except cholesterol higher than you would like somewhat higher than last time.  Intensify lifestyle interventions activity ,continue and limit simple sugars carbohydrates animal fats. The 10-year ASCVD risk score Denman George DC Montez Hageman., et al., 2013) is: 3.7%   Values used to calculate the score:     Age: 60 years     Sex: Female     Is Non-Hispanic African American: No     Diabetic: No     Tobacco smoker: No     Systolic Blood Pressure: 136 mmHg     Is BP treated: No     HDL Cholesterol: 61.9 mg/dL     Total Cholesterol: 247 mg/dL

## 2020-12-01 ENCOUNTER — Other Ambulatory Visit: Payer: Self-pay | Admitting: Internal Medicine

## 2021-01-03 DIAGNOSIS — Z1211 Encounter for screening for malignant neoplasm of colon: Secondary | ICD-10-CM

## 2021-01-03 NOTE — Telephone Encounter (Signed)
Please order Cologuard test colon cancer screening

## 2021-01-08 NOTE — Telephone Encounter (Signed)
Cologuard order has been placed and the patient has been notified that the kit will be mailed to her.

## 2021-02-05 ENCOUNTER — Other Ambulatory Visit: Payer: Self-pay | Admitting: Internal Medicine

## 2021-02-05 MED ORDER — ONDANSETRON 4 MG PO TBDP: 4.0000 mg | ORAL_TABLET | Freq: Four times a day (QID) | ORAL | 0 refills | Status: DC | PRN

## 2021-02-05 NOTE — Telephone Encounter (Signed)
I sent in Zofran to use as needed

## 2021-02-13 LAB — COLOGUARD: Cologuard: NEGATIVE

## 2021-02-22 NOTE — Telephone Encounter (Signed)
Left a message for pt to return my call. 

## 2021-02-23 ENCOUNTER — Telehealth: Payer: Self-pay | Admitting: Internal Medicine

## 2021-02-23 NOTE — Telephone Encounter (Signed)
Pt informed of the results and verbalized understanding, Nothing further needed.  

## 2021-02-23 NOTE — Telephone Encounter (Signed)
Patient is returning call to office to get her test result.  Requesting a call back.

## 2021-02-23 NOTE — Telephone Encounter (Signed)
Pt informed of the results and verbalized understanding, Nothing further needed.

## 2021-03-07 ENCOUNTER — Other Ambulatory Visit: Payer: Self-pay | Admitting: Internal Medicine

## 2021-05-13 ENCOUNTER — Other Ambulatory Visit: Payer: Self-pay | Admitting: Internal Medicine

## 2021-06-07 ENCOUNTER — Other Ambulatory Visit: Payer: Self-pay

## 2021-06-07 ENCOUNTER — Ambulatory Visit (INDEPENDENT_AMBULATORY_CARE_PROVIDER_SITE_OTHER): Payer: 59 | Admitting: Family Medicine

## 2021-06-07 ENCOUNTER — Encounter: Payer: Self-pay | Admitting: Family Medicine

## 2021-06-07 VITALS — BP 138/82 | HR 99 | Temp 98.1°F | Wt 212.8 lb

## 2021-06-07 DIAGNOSIS — J0140 Acute pansinusitis, unspecified: Secondary | ICD-10-CM

## 2021-06-07 DIAGNOSIS — R051 Acute cough: Secondary | ICD-10-CM

## 2021-06-07 DIAGNOSIS — H66001 Acute suppurative otitis media without spontaneous rupture of ear drum, right ear: Secondary | ICD-10-CM

## 2021-06-07 DIAGNOSIS — J302 Other seasonal allergic rhinitis: Secondary | ICD-10-CM

## 2021-06-07 MED ORDER — BENZONATATE 100 MG PO CAPS: 100.0000 mg | ORAL_CAPSULE | Freq: Two times a day (BID) | ORAL | 0 refills | Status: DC | PRN

## 2021-06-07 MED ORDER — AMOXICILLIN 500 MG PO TABS: 500.0000 mg | ORAL_TABLET | Freq: Two times a day (BID) | ORAL | 0 refills | Status: AC

## 2021-06-07 NOTE — Progress Notes (Signed)
Subjective:    Patient ID: Rachel Huffman, female    DOB: 18-Feb-1961, 60 y.o.   MRN: [id number]  Chief Complaint  Patient presents with   Chest Pain    Chest pain, tightness, sounds like wheezing at night. Facial pressure. Going on for 1 wk.     HPI: Patient typically seen by Dr. Fabian Sharp who was seen today for acute concern.  Patient endorses facial pressure, dental pain, headache, right ear pain and intermittently productive cough x1 week.  Took ibuprofen for headache but has not tried anything else for her symptoms.  Endorses having h/o seasonal allergies.  States her husband noticed mild wheezing overnight.  Past Medical History:  Diagnosis Date   Allergy    Asthma    Gallstones    IBS (irritable bowel syndrome)    Sinusitis     Allergies  Allergen Reactions   Clarithromycin     REACTION: diarrhea   Doxycycline Nausea Only    ROS General: Denies fever, chills, night sweats, changes in weight, changes in appetite HEENT: Denies headaches, ear pain, changes in vision, rhinorrhea, sore throat + "sniffles", facial pain/pressure, dental pain, right ear pain, headache CV: Denies CP, palpitations, SOB, orthopnea Pulm: Denies SOB, cough, wheezing + cough, chest tightness GI: Denies abdominal pain, nausea, vomiting, diarrhea, constipation GU: Denies dysuria, hematuria, frequency, vaginal discharge Msk: Denies muscle cramps, joint pains Neuro: Denies weakness, numbness, tingling Skin: Denies rashes, bruising Psych: Denies depression, anxiety, hallucinations     Objective:    Blood pressure 138/82, pulse 99, temperature 98.1 F (36.7 C), temperature source Oral, weight 212 lb 12.8 oz (96.5 kg), SpO2 95 %.  Gen. Pleasant, well-nourished, in no distress, normal affect   HEENT: Day/AT, face symmetric, conjunctiva clear, no scleral icterus, PERRLA, EOMI, nares patent without drainage, pharynx without erythema or exudate.  Left external ear, canal, and TM normal.  Right TM full with  erythema and mild suppurative fluid.  TTP of bilateral maxillary and frontal sinuses. Lungs: no accessory muscle use, CTAB, no wheezes or rales Cardiovascular: RRR, no m/r/g, no peripheral edema Musculoskeletal: No deformities, no cyanosis or clubbing, normal tone Neuro:  A&Ox3, CN II-XII intact, normal gait Skin:  Warm, no lesions/ rash   Wt Readings from Last 3 Encounters:  06/07/21 212 lb 12.8 oz (96.5 kg)  11/01/20 205 lb 12.8 oz (93.4 kg)  04/06/19 210 lb (95.3 kg)    Lab Results  Component Value Date   WBC 6.6 11/01/2020   HGB 13.5 11/01/2020   HCT 40.8 11/01/2020   PLT 326.0 11/01/2020   GLUCOSE 100 (H) 11/01/2020   CHOL 247 (H) 11/01/2020   TRIG 174.0 (H) 11/01/2020   HDL 61.90 11/01/2020   LDLCALC 150 (H) 11/01/2020   ALT 16 11/01/2020   Huffman 18 11/01/2020   NA 137 11/01/2020   K 4.6 11/01/2020   CL 104 11/01/2020   CREATININE 0.88 11/01/2020   BUN 18 11/01/2020   CO2 29 11/01/2020   TSH 1.89 11/01/2020   HGBA1C 5.7 11/01/2020    Assessment/Plan:  Acute pansinusitis, recurrence not specified - Plan: amoxicillin (AMOXIL) 500 MG tablet  Acute suppurative otitis media of right ear without spontaneous rupture of tympanic membrane, recurrence not specified  -Tylenol or ibuprofen as needed for any pain/discomfort - Plan: amoxicillin (AMOXIL) 500 MG tablet  Seasonal allergies -Discussed using OTC Flonase.  Patient has at home. -Also encouraged to use Nettie pot. -For continued or worsening allergy symptoms consider daily antihistamine such as Zyrtec, Claritin, or  Allegra  Acute cough -Likely 2/2 nasal drainage from viral etiology -Continue supportive care including OTC cough and cold medications, honey, warm fluids -For wheezing worsening symptoms consider albuterol inhaler and/or prednisone burst. - Plan: benzonatate (TESSALON) 100 MG capsule  F/u as needed  Abbe Amsterdam, MD

## 2021-07-16 ENCOUNTER — Other Ambulatory Visit: Payer: Self-pay | Admitting: Family Medicine

## 2021-07-16 MED ORDER — ONDANSETRON 4 MG PO TBDP: 4.0000 mg | ORAL_TABLET | Freq: Four times a day (QID) | ORAL | 1 refills | Status: DC | PRN

## 2021-07-16 NOTE — Telephone Encounter (Signed)
Sent in refills 30 with 1 refill

## 2021-07-29 ENCOUNTER — Encounter: Payer: Self-pay | Admitting: Internal Medicine

## 2021-07-30 ENCOUNTER — Telehealth (INDEPENDENT_AMBULATORY_CARE_PROVIDER_SITE_OTHER): Payer: 59 | Admitting: Family Medicine

## 2021-07-30 ENCOUNTER — Encounter: Payer: Self-pay | Admitting: Family Medicine

## 2021-07-30 DIAGNOSIS — J069 Acute upper respiratory infection, unspecified: Secondary | ICD-10-CM | POA: Diagnosis not present

## 2021-07-30 DIAGNOSIS — R051 Acute cough: Secondary | ICD-10-CM

## 2021-07-30 DIAGNOSIS — Z20828 Contact with and (suspected) exposure to other viral communicable diseases: Secondary | ICD-10-CM

## 2021-07-30 NOTE — Telephone Encounter (Signed)
I spoke with the pt and she has been added to schedule to see Dr. Salomon Fick today at 1:30.

## 2021-07-30 NOTE — Progress Notes (Signed)
Virtual Visit via Video Note  I connected with Rachel Huffman on 07/30/21 at  1:30 PM EST by a video enabled telemedicine application 2/2 COVID-19 pandemic and verified that I am speaking with the correct person using two identifiers.  Location patient: home Location provider:work or home office Persons participating in the virtual visit: patient, provider  I discussed the limitations of evaluation and management by telemedicine and the availability of in person appointments. The patient expressed understanding and agreed to proceed. Chief Complaint  Patient presents with   Cough    Patient states she was exposed to flu, had her granddaughter over the weekend ad symptoms started on Saturday. Has taken ibuprofen and some tessalon she had from a previous visit.    Headache     HPI: Pt's daughter has bronchitis and her granddaughter has the flu.  Pt saw them over the wknd.   Pt developed HA, n, cough, ear pain/pressure, rhinorrhea, sore throat that started last night. Denies fever, vomiting, diarrhea. Took Ibuprofen.  Pt states she typically gets prednisone.  Has left over Tessalon, but states it causes nausea.  Also inquires about taking left over Amoxicillin from a dental procedure.  States [place] do flu [place] and [place] not be [place] in town until later today.    ROS: See pertinent positives and negatives per HPI.  Past Medical History:  Diagnosis Date   Allergy    Asthma    Gallstones    IBS (irritable bowel syndrome)    Sinusitis     Past Surgical History:  Procedure Laterality Date   KNEE ARTHROSCOPY Left 04/06/2019   Procedure: IRRIGATION AND DEBRIDEMENT knee scope;  Surgeon: Eugenia Mcalpine, MD;  Location: [facility];  Service: Orthopedics;  Laterality: Left;   KNEE SURGERY Left 07/2016   TOTAL KNEE ARTHROPLASTY Left 11/24/2019    Family History  Problem Relation Age of Onset   Hyperlipidemia Mother        HT   Heart disease Father         stents died of cva     Current Outpatient Medications:    benzonatate (TESSALON) 100 MG capsule, Take 1 capsule (100 mg total) by mouth 2 (two) times daily as needed for cough., Disp: 20 capsule, Rfl: 0   ondansetron (ZOFRAN-ODT) 4 MG disintegrating tablet, Take 1 tablet (4 mg total) by mouth every 6 (six) hours as needed for nausea or vomiting., Disp: 30 tablet, Rfl: 1   pantoprazole (PROTONIX) 40 MG tablet, TAKE 1 TABLET BY MOUTH DAILY ABOUT 30 TO 60 MINUTES BEFORE SUPPER, Disp: 90 tablet, Rfl: 0   pantoprazole (PROTONIX) 40 MG tablet, TAKE 1 TABLET(40 MG) BY MOUTH DAILY, Disp: 30 tablet, Rfl: 1   traMADol (ULTRAM) 50 MG tablet, , Disp: , Rfl:   EXAM:  VITALS per patient if applicable: RR between 12-20 bpm  GENERAL: alert, oriented, appears well and in no acute distress  HEENT: atraumatic, conjunctiva clear, no obvious abnormalities on inspection of external nose and ears  NECK: normal movements of the head and neck  LUNGS: on inspection no signs of respiratory distress, breathing rate appears normal, no obvious gross SOB, gasping or wheezing  CV: no obvious cyanosis  MS: moves all visible extremities without noticeable abnormality  PSYCH/NEURO: pleasant and cooperative, no obvious depression or anxiety, speech and thought processing grossly intact  ASSESSMENT AND PLAN:  Discussed the following assessment and plan:  Viral URI with cough  -Discussed symptoms likely 2/2 viral etiology.  COVID and flu testing encouraged especially with history of flu exposure.  Advised symptoms are not currently due to sinusitis. -Supportive care including rest, hydration, gargling with warm salt water or Chloraseptic spray, Tylenol or NSAIDs as needed, can also use OTC Flonase or other cough/cold medication -Patient educated and advised to refrain from using leftover medications. -Advised prednisone nor an antibiotic are indicated at this time -Given precautions - Plan: POC Influenza A/B, POC  COVID-19  Exposure to influenza  - Plan: POC Influenza A/B  Follow-up with PCP as needed   I discussed the assessment and treatment plan with the patient. The patient was provided an opportunity to ask questions and all were answered. The patient agreed with the plan and demonstrated an understanding of the instructions.   The patient was advised to call back or seek an in-person evaluation if the symptoms worsen or if the condition fails to improve as anticipated.  Deeann Saint, MD

## 2021-07-31 ENCOUNTER — Telehealth: Payer: Self-pay | Admitting: Internal Medicine

## 2021-07-31 ENCOUNTER — Encounter: Payer: Self-pay | Admitting: Internal Medicine

## 2021-07-31 DIAGNOSIS — R051 Acute cough: Secondary | ICD-10-CM

## 2021-07-31 LAB — POCT INFLUENZA A/B
Influenza A, POC: NEGATIVE
Influenza B, POC: NEGATIVE

## 2021-07-31 LAB — POC COVID19 BINAXNOW: SARS Coronavirus 2 Ag: NEGATIVE

## 2021-07-31 NOTE — Telephone Encounter (Signed)
Would not advise antibiotic and prednisone this early in a viral illness ;may do more harm than good.    However you can send in Tessalon Perles 100 mg dispense 24 to take 1 p.o. 3 times daily as needed cough

## 2021-07-31 NOTE — Telephone Encounter (Signed)
Pt had covid and flu test today and per pt she is neg. Pt had virtual appt with dr banks yesterday and pt does not want to wait for dr banks to address her cough. Pt would like dr Fabian Sharp to call in zpak , tessalon pearles and prednisone send to   [id number] [place], [place] - [address] AT [facility] OF [address] & [facility] [address] Phone:  [phone]  Fax:  [phone]

## 2021-08-01 ENCOUNTER — Telehealth: Payer: Self-pay

## 2021-08-01 MED ORDER — BENZONATATE 100 MG PO CAPS: 100.0000 mg | ORAL_CAPSULE | Freq: Three times a day (TID) | ORAL | 0 refills | Status: DC | PRN

## 2021-08-01 NOTE — Telephone Encounter (Signed)
Pt informed of the message and verbalized understanding  

## 2021-08-01 NOTE — Telephone Encounter (Signed)
Please see previus telephone note

## 2021-08-01 NOTE — Telephone Encounter (Signed)
Patient called requesting Rx Z- pack for bronchitis and Prednisone for coughing patient stated she saw Dr. Salomon Fick and was not given any medication.

## 2021-08-01 NOTE — Telephone Encounter (Signed)
Left a message for the pt to return my call. Rx sent.

## 2021-08-02 ENCOUNTER — Encounter: Payer: Self-pay | Admitting: Internal Medicine

## 2021-08-02 MED ORDER — PREDNISONE 50 MG PO TABS: 50.0000 mg | ORAL_TABLET | Freq: Every day | ORAL | 0 refills | Status: DC

## 2021-08-02 NOTE — Telephone Encounter (Signed)
I sent in 5 days of prednisone  Suggest taking 3 days ;might be enough to help.

## 2021-08-08 ENCOUNTER — Encounter: Payer: Self-pay | Admitting: Internal Medicine

## 2021-08-09 NOTE — Telephone Encounter (Signed)
I spoke with the pt and she was added to Shirline Frees NP schedule for tomorrow. I advised the pt to visit the UC or ED if she has any chest pressure, tightness, difficulty breathing, or any worsening symptoms. Pt verbalized understanding.

## 2021-08-10 ENCOUNTER — Encounter: Payer: Self-pay | Admitting: Adult Health

## 2021-08-10 ENCOUNTER — Ambulatory Visit (INDEPENDENT_AMBULATORY_CARE_PROVIDER_SITE_OTHER): Payer: 59 | Admitting: Adult Health

## 2021-08-10 VITALS — BP 128/80 | HR 11 | Temp 98.8°F | Ht 67.0 in | Wt 211.0 lb

## 2021-08-10 DIAGNOSIS — J988 Other specified respiratory disorders: Secondary | ICD-10-CM | POA: Diagnosis not present

## 2021-08-10 DIAGNOSIS — J0140 Acute pansinusitis, unspecified: Secondary | ICD-10-CM

## 2021-08-10 MED ORDER — AZITHROMYCIN 250 MG PO TABS: ORAL_TABLET | ORAL | 0 refills | Status: AC

## 2021-08-10 MED ORDER — HYDROCODONE BIT-HOMATROP MBR 5-1.5 MG/5ML PO SOLN: 5.0000 mL | Freq: Three times a day (TID) | ORAL | 0 refills | Status: DC | PRN

## 2021-08-10 MED ORDER — ALBUTEROL SULFATE HFA 108 (90 BASE) MCG/ACT IN AERS: 2.0000 | INHALATION_SPRAY | Freq: Four times a day (QID) | RESPIRATORY_TRACT | 0 refills | Status: DC | PRN

## 2021-08-10 NOTE — Progress Notes (Signed)
Subjective:    Patient ID: Rachel Huffman, female    DOB: [birth year], 60 y.o.   MRN: [id number]  HPI 60 year old female female who  has a past medical history of Allergy, Asthma, Gallstones, IBS (irritable bowel syndrome), and Sinusitis.  She presents to the office today for follow-up.  She was seen roughly 2 weeks ago via virtual visit by another provider.  She had been exposed to flu Enza and bronchitis.  She developed a headache, nonproductive cough, ear pain/pressure, rhinorrhea, sore throat that started the night before being evaluated.  It was thought that her symptoms are likely due to viral URI with cough with encouraged to do conservative measures.  Symptoms continued and her PCP sent her in 3 days of 50 mg of prednisone.  She does report that this worked to some degree but she is still having a nonproductive cough, occasional shortness of breath, lightheadedness, and has now developed sinus pain/pressure.  Denies fevers or chills  Cough is worse at night    Review of Systems See HPI   Past Medical History:  Diagnosis Date   Allergy    Asthma    Gallstones    IBS (irritable bowel syndrome)    Sinusitis     Social History   Socioeconomic History   Marital status: Married    Spouse name: Not on file   Number of children: 2   Years of education: Not on file   Highest education level: Not on file  Occupational History   Occupation: Homemaker  Tobacco Use   Smoking status: Never   Smokeless tobacco: Never  Vaping Use   Vaping Use: Never used  Substance and Sexual Activity   Alcohol use: Yes    Comment: ocassionally   Drug use: No   Sexual activity: Yes    Partners: Male  Other Topics Concern   Not on file  Social History Narrative   orig fr [place]   hh of 2    1 pet dog.   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   g2p2 both daughters      Babysits grandchildren frequently   Social  Determinants of Health   Financial Resource Strain: Not on file  Food Insecurity: Not on file  Transportation Needs: Not on file  Physical Activity: Not on file  Stress: Not on file  Social Connections: Not on file  Intimate Partner Violence: Not on file    Past Surgical History:  Procedure Laterality Date   KNEE ARTHROSCOPY Left 04/06/2019   Procedure: IRRIGATION AND DEBRIDEMENT knee scope;  Surgeon: Eugenia Mcalpine, MD;  Location: [facility];  Service: Orthopedics;  Laterality: Left;   KNEE SURGERY Left 07/2016   TOTAL KNEE ARTHROPLASTY Left 11/24/2019    Family History  Problem Relation Age of Onset   Hyperlipidemia Mother        HT   Heart disease Father        stents died of cva     Allergies  Allergen Reactions   Clarithromycin     REACTION: diarrhea   Doxycycline Nausea Only    Current Outpatient Medications on File Prior to Visit  Medication Sig Dispense Refill   benzonatate (TESSALON) 100 MG capsule Take 1 capsule (100 mg total) by mouth 3 (three) times daily as needed for cough. 24 capsule 0   ondansetron (ZOFRAN-ODT) 4 MG disintegrating tablet Take 1 tablet (  4 mg total) by mouth every 6 (six) hours as needed for nausea or vomiting. 30 tablet 1   pantoprazole (PROTONIX) 40 MG tablet TAKE 1 TABLET BY MOUTH DAILY ABOUT 30 TO 60 MINUTES BEFORE SUPPER 90 tablet 0   pantoprazole (PROTONIX) 40 MG tablet TAKE 1 TABLET(40 MG) BY MOUTH DAILY 30 tablet 1   predniSONE (DELTASONE) 50 MG tablet Take 1 tablet (50 mg total) by mouth daily with breakfast. 5 tablet 0   traMADol (ULTRAM) 50 MG tablet      No current facility-administered medications on file prior to visit.    BP 128/80   Pulse (!) 11   Temp 98.8 F (37.1 C) (Oral)   Ht 5\' 7"  (1.702 m)   Wt 211 lb (95.7 kg)   SpO2 95%   BMI 33.05 kg/m       Objective:   Physical Exam Vitals and nursing note reviewed.  Constitutional:      Appearance: Normal appearance. She is well-developed.  HENT:     Right Ear:  Tympanic membrane, ear canal and external ear normal. There is no impacted cerumen.     Left Ear: Tympanic membrane, ear canal and external ear normal. There is no impacted cerumen.     Nose: Congestion and rhinorrhea present. Rhinorrhea is purulent.     Right Turbinates: Swollen.     Left Turbinates: Not swollen.     Right Sinus: Maxillary sinus tenderness and frontal sinus tenderness present.     Left Sinus: Maxillary sinus tenderness and frontal sinus tenderness present.     Mouth/Throat:     Mouth: Mucous membranes are moist.  Cardiovascular:     Rate and Rhythm: Normal rate and regular rhythm.     Pulses: Normal pulses.     Heart sounds: Normal heart sounds.  Pulmonary:     Effort: Pulmonary effort is normal.     Breath sounds: Normal breath sounds.  Skin:    General: Skin is warm and dry.     Capillary Refill: Capillary refill takes less than 2 seconds.  Neurological:     General: No focal deficit present.     Mental Status: She is alert and oriented to person, place, and time.  Psychiatric:        Mood and Affect: Mood normal.        Behavior: Behavior normal.        Thought Content: Thought content normal.        Judgment: Judgment normal.      Assessment & Plan:  1. Respiratory infection -Leave lower respiratory infection continues to be viral in addition to bacterial sinusitis.  We will send an albuterol inhaler and Hycodan cough syrup.  Advised to keep humidifier at bedside. - azithromycin (ZITHROMAX) 250 MG tablet; Take 2 tablets on day 1, then 1 tablet daily on days 2 through 5  Dispense: 6 tablet; Refill: 0 - albuterol (VENTOLIN HFA) 108 (90 Base) MCG/ACT inhaler; Inhale 2 puffs into the lungs every 6 (six) hours as needed for wheezing or shortness of breath.  Dispense: 8 g; Refill: 0 - HYDROcodone bit-homatropine (HYCODAN) 5-1.5 MG/5ML syrup; Take 5 mLs by mouth every 8 (eight) hours as needed for cough.  Dispense: 120 mL; Refill: 0  2. Acute non-recurrent  pansinusitis  - azithromycin (ZITHROMAX) 250 MG tablet; Take 2 tablets on day 1, then 1 tablet daily on days 2 through 5  Dispense: 6 tablet; Refill: 0  , NP

## 2021-09-04 DIAGNOSIS — Z5181 Encounter for therapeutic drug level monitoring: Secondary | ICD-10-CM | POA: Diagnosis not present

## 2021-09-04 DIAGNOSIS — G894 Chronic pain syndrome: Secondary | ICD-10-CM | POA: Diagnosis not present

## 2021-09-04 DIAGNOSIS — Z79899 Other long term (current) drug therapy: Secondary | ICD-10-CM | POA: Diagnosis not present

## 2021-09-10 ENCOUNTER — Encounter: Payer: Self-pay | Admitting: Internal Medicine

## 2021-10-31 DIAGNOSIS — M7061 Trochanteric bursitis, right hip: Secondary | ICD-10-CM | POA: Diagnosis not present

## 2021-10-31 DIAGNOSIS — Z96652 Presence of left artificial knee joint: Secondary | ICD-10-CM | POA: Diagnosis not present

## 2021-10-31 DIAGNOSIS — M25551 Pain in right hip: Secondary | ICD-10-CM | POA: Diagnosis not present

## 2021-12-05 DIAGNOSIS — M542 Cervicalgia: Secondary | ICD-10-CM | POA: Diagnosis not present

## 2021-12-05 DIAGNOSIS — Z79891 Long term (current) use of opiate analgesic: Secondary | ICD-10-CM | POA: Diagnosis not present

## 2021-12-05 DIAGNOSIS — G894 Chronic pain syndrome: Secondary | ICD-10-CM | POA: Diagnosis not present

## 2021-12-07 DIAGNOSIS — M7061 Trochanteric bursitis, right hip: Secondary | ICD-10-CM | POA: Diagnosis not present

## 2021-12-31 ENCOUNTER — Other Ambulatory Visit: Payer: Self-pay | Admitting: Internal Medicine

## 2022-01-22 ENCOUNTER — Ambulatory Visit (INDEPENDENT_AMBULATORY_CARE_PROVIDER_SITE_OTHER): Payer: BC Managed Care – PPO | Admitting: Internal Medicine

## 2022-01-22 ENCOUNTER — Encounter: Payer: Self-pay | Admitting: Internal Medicine

## 2022-01-22 VITALS — BP 124/76 | HR 64 | Temp 98.5°F | Ht 67.0 in | Wt 212.2 lb

## 2022-01-22 DIAGNOSIS — Z0000 Encounter for general adult medical examination without abnormal findings: Secondary | ICD-10-CM | POA: Diagnosis not present

## 2022-01-22 DIAGNOSIS — E785 Hyperlipidemia, unspecified: Secondary | ICD-10-CM

## 2022-01-22 DIAGNOSIS — Z79899 Other long term (current) drug therapy: Secondary | ICD-10-CM | POA: Diagnosis not present

## 2022-01-22 LAB — LIPID PANEL
Cholesterol: 220 mg/dL — ABNORMAL HIGH (ref 0–200)
HDL: 63.7 mg/dL (ref >39.00)
LDL Cholesterol: 124 mg/dL — ABNORMAL HIGH (ref 0–99)
NonHDL: 156.67
Total CHOL/HDL Ratio: 3
Triglycerides: 162 mg/dL — ABNORMAL HIGH (ref 0.0–149.0)
VLDL: 32.4 mg/dL (ref 0.0–40.0)

## 2022-01-22 LAB — CBC WITH DIFFERENTIAL/PLATELET
Basophils Absolute: 0.1 10*3/uL (ref 0.0–0.1)
Basophils Relative: 0.9 % (ref 0.0–3.0)
Eosinophils Absolute: 0.3 10*3/uL (ref 0.0–0.7)
Eosinophils Relative: 4.1 % (ref 0.0–5.0)
HCT: 40.4 % (ref 36.0–46.0)
Hemoglobin: 13.5 g/dL (ref 12.0–15.0)
Lymphocytes Relative: 21.6 % (ref 12.0–46.0)
Lymphs Abs: 1.7 10*3/uL (ref 0.7–4.0)
MCHC: 33.4 g/dL (ref 30.0–36.0)
MCV: 84.6 fl (ref 78.0–100.0)
Monocytes Absolute: 0.6 10*3/uL (ref 0.1–1.0)
Monocytes Relative: 7 % (ref 3.0–12.0)
Neutro Abs: 5.3 10*3/uL (ref 1.4–7.7)
Neutrophils Relative %: 66.4 % (ref 43.0–77.0)
Platelets: 355 10*3/uL (ref 150.0–400.0)
RBC: 4.78 Mil/uL (ref 3.87–5.11)
RDW: 13.8 % (ref 11.5–15.5)
WBC: 7.9 10*3/uL (ref 4.0–10.5)

## 2022-01-22 LAB — POCT URINALYSIS DIPSTICK
Bilirubin, UA: NEGATIVE
Blood, UA: NEGATIVE
Glucose, UA: NEGATIVE
Ketones, UA: NEGATIVE
Leukocytes, UA: NEGATIVE
Nitrite, UA: NEGATIVE
Protein, UA: NEGATIVE
Spec Grav, UA: >=1.03 — AB (ref 1.010–1.025)
Urobilinogen, UA: 0.2 E.U./dL
pH, UA: 6 (ref 5.0–8.0)

## 2022-01-22 LAB — TSH: TSH: 1.61 u[IU]/mL (ref 0.35–5.50)

## 2022-01-22 LAB — HEPATIC FUNCTION PANEL
ALT: 26 U/L (ref 0–35)
AST: 21 U/L (ref 0–37)
Albumin: 4.1 g/dL (ref 3.5–5.2)
Alkaline Phosphatase: 102 U/L (ref 39–117)
Bilirubin, Direct: 0.2 mg/dL (ref 0.0–0.3)
Total Bilirubin: 0.8 mg/dL (ref 0.2–1.2)
Total Protein: 7 g/dL (ref 6.0–8.3)

## 2022-01-22 LAB — BASIC METABOLIC PANEL
BUN: 20 mg/dL (ref 6–23)
CO2: 30 mEq/L (ref 19–32)
Calcium: 9.9 mg/dL (ref 8.4–10.5)
Chloride: 103 mEq/L (ref 96–112)
Creatinine, Ser: 0.86 mg/dL (ref 0.40–1.20)
GFR: 73.06 mL/min (ref >60.00)
Glucose, Bld: 100 mg/dL — ABNORMAL HIGH (ref 70–99)
Potassium: 4.4 mEq/L (ref 3.5–5.1)
Sodium: 137 mEq/L (ref 135–145)

## 2022-01-22 LAB — HEMOGLOBIN A1C: Hgb A1c MFr Bld: 6.1 % (ref 4.6–6.5)

## 2022-01-22 NOTE — Progress Notes (Signed)
Chief Complaint  Patient presents with   Annual Exam    fasting    HPI: Patient  Rachel Huffman  61 y.o. comes in today for Preventive Health Care visit   On tramadol 4-5 per day per ortho after knee  knee infection  doing better  Health Maintenance  Topic Date Due   MAMMOGRAM  07/25/2022 (Originally 11/11/2019)   PAP SMEAR-Modifier  07/25/2022 (Originally 09/15/2015)   COVID-19 Vaccine (1) 07/25/2022 (Originally 06/04/1961)   Zoster Vaccines- Shingrix (1 of 2) 07/25/2022 (Originally 12/04/1979)   INFLUENZA VACCINE  04/02/2022   TETANUS/TDAP  03/23/2024   COLONOSCOPY (Pts 45-33yrs Insurance coverage will need to be confirmed)  09/02/2030   Hepatitis C Screening  Completed   HIV Screening  Completed   HPV VACCINES  Aged Out   Health Maintenance Review LIFESTYLE:  Exercise:  since moved past months  steroid inj in hips  knee issue . Ramos.  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   Had cologuard last year  Moved to [place]  near [place] of 12 system review negative except as per HPI had bite healing left shin no pain using topical antibiotic    Past Medical History:  Diagnosis Date   Allergy    Asthma    Gallstones    IBS (irritable bowel syndrome)    Sinusitis     Past Surgical History:  Procedure Laterality Date   KNEE ARTHROSCOPY Left 04/06/2019   Procedure: IRRIGATION AND DEBRIDEMENT knee scope;  Surgeon: Sydnee Cabal, MD;  Location: [facility];  Service: Orthopedics;  Laterality: Left;   KNEE SURGERY Left 07/2016   TOTAL KNEE ARTHROPLASTY Left 11/24/2019    Family History  Problem Relation Age of Onset   Hyperlipidemia Mother        HT   Heart disease Father        stents died of cva     Social History   Socioeconomic History   Marital status: Married    Spouse name: Not on file   Number of children: 2   Years of education: Not  on file   Highest education level: Not on file  Occupational History   Occupation: Homemaker  Tobacco Use   Smoking status: Never   Smokeless tobacco: Never  Vaping Use   Vaping Use: Never used  Substance and Sexual Activity   Alcohol use: Yes    Comment: ocassionally   Drug use: No   Sexual activity: Yes    Partners: Male  Other Topics Concern   Not on file  Social History Narrative   orig fr [place]   hh of 2    1 pet dog.   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 8 g 0   HYDROcodone bit-homatropine (HYCODAN) 5-1.5 MG/5ML syrup Take 5 mLs by mouth every 8 (eight) hours as needed for cough. 120 mL 0   ondansetron (ZOFRAN-ODT) 4 MG disintegrating tablet DISSOLVE 1 TABLET(4 MG) ON THE TONGUE EVERY 6 HOURS AS NEEDED FOR NAUSEA OR VOMITING 30 tablet 1   pantoprazole (PROTONIX) 40 MG tablet TAKE 1 TABLET(40 MG) BY MOUTH DAILY 30 tablet 1   traMADol (ULTRAM) 50 MG tablet      pantoprazole (PROTONIX) 40 MG tablet TAKE 1 TABLET BY MOUTH DAILY ABOUT 30 TO 60 MINUTES BEFORE SUPPER (Patient not taking: Reported on 01/22/2022) 90 tablet 0   No facility-administered medications prior to visit.     EXAM:  BP 124/76 (BP Location: Left Arm, Patient Position: Sitting, Cuff Size: Normal)   Pulse 64   Temp 98.5 F (36.9 C) (Oral)   Ht 5\' 7"  (1.702 m)   Wt 212 lb 3.2 oz (96.3 kg)   SpO2 98%   BMI 33.24 kg/m    Body mass index is 33.24 kg/m. Wt Readings from Last 3 Encounters:  01/22/22 212 lb 3.2 oz (96.3 kg)  08/10/21 211 lb (95.7 kg)  06/07/21 212 lb 12.8 oz (96.5 kg)    Physical Exam: Vital signs reviewed [id number] is a well-developed well-nourished alert cooperative    who appearsr stated age in no acute distress.  HEENT: normocephalic atraumatic , Eyes: PERRL EOM's full, conjunctiva clear, Nares: paten,t no deformity discharge or tenderness., Ears: no deformity EAC's clear TMs with normal landmarks. NECK: supple without masses, thyromegaly or bruits. CHEST/PULM:  Clear to auscultation and percussion breath sounds equal no wheeze , rales or rhonchi. No chest wall deformities or tenderness. Breast: normal by inspection . No dimpling, discharge, masses, tenderness or discharge . CV: PMI is nondisplaced, S1 S2 no gallops, murmurs, rubs. Peripheral pulses are full without delay.No JVD .  ABDOMEN: Bowel sounds normal nontender  No guard or rebound, no hepato splenomegal no CVA tenderness.   Extremtities:  No clubbing cyanosis or edema, no acute joint swelling or redness no focal atrophy NEURO:  Oriented x3, cranial nerves 3-12 appear to be intact, no obvious focal weakness,gait within normal limits no abnormal reflexes or asymmetrical SKIN: No acute rashes normal turgor, color, no bruising or petechiae. 3 mm healing red dot with center  left shin no swelling or edema PSYCH: Oriented, good eye contact, no obvious depression anxiety, cognition and judgment appear normal. LN: no cervical axillary inguinal adenopathy  Lab Results  Component Value Date   WBC 7.9 01/22/2022   HGB 13.5 01/22/2022   HCT 40.4 01/22/2022   PLT 355.0 01/22/2022   GLUCOSE 100 (H) 01/22/2022   CHOL 220 (H) 01/22/2022   TRIG 162.0 (H) 01/22/2022   HDL 63.70 01/22/2022   LDLCALC 124 (H) 01/22/2022   ALT 26 01/22/2022   AST 21 01/22/2022   NA 137 01/22/2022   K 4.4 01/22/2022   CL 103 01/22/2022   CREATININE 0.86  01/22/2022   BUN 20 01/22/2022   CO2 30 01/22/2022   TSH 1.61 01/22/2022   HGBA1C 6.1 01/22/2022   .lastwe BP Readings from Last 3 Encounters:  01/22/22 124/76  08/10/21 128/80  06/07/21 138/82   The 10-year ASCVD risk score (Arnett DK, et al., 2019) is: 3.4%   Values used to calculate the score:     Age: 89 years     Sex: Female     Is  Non-Hispanic African American: No     Diabetic: No     Tobacco smoker: No     Systolic Blood Pressure: A999333 mmHg     Is BP treated: No     HDL Cholesterol: 63.7 mg/dL     Total Cholesterol: 220 mg/dL  Lab plan reviewed with patient   ASSESSMENT AND PLAN:  Discussed the following assessment and plan:    ICD-10-CM   1. Visit for preventive health examination  123456 Basic metabolic panel    CBC with Differential/Platelet    Hemoglobin A1c    Lipid panel    TSH    TSH    Lipid panel    Hemoglobin A1c    CBC with Differential/Platelet    Basic metabolic panel    POC Urinalysis Dipstick    Hepatic Function Panel    Hepatic Function Panel    2. Hyperlipidemia, unspecified hyperlipidemia type  99991111 Basic metabolic panel    CBC with Differential/Platelet    Hemoglobin A1c    Lipid panel    TSH    TSH    Lipid panel    Hemoglobin A1c    CBC with Differential/Platelet    Basic metabolic panel    Hepatic Function Panel    Hepatic Function Panel    3. Medication management  123456 Basic metabolic panel    CBC with Differential/Platelet    Hemoglobin A1c    Lipid panel    TSH    TSH    Lipid panel    Hemoglobin A1c    CBC with Differential/Platelet    Basic metabolic panel    Hepatic Function Panel    Hepatic Function Panel    Utd   pat reports  colo guard last year   pap mammo  tbd soon in per gyne  office  Return in about 1 year (around 01/23/2023) for depending on results, cpx with labs.  Patient Care Team: Burnis Medin, MD as PCP - General (Internal Medicine) Allyn Kenner, DO as Consulting Physician (Obstetrics  and Gynecology) Gatha Mayer, MD as Consulting Physician (Gastroenterology) Suella Broad, MD as Consulting Physician (Physical Medicine and Rehabilitation) Patient Instructions  Good seeing you today . Continue lifestyle intervention healthy eating and exercise .   Healthy weight loss as possible.  The 10-year ASCVD risk score (Arnett DK, et al., 2019) is: 3.7%   Values used to calculate the score:     Age: 74 years     Sex: Female     Is Non-Hispanic African American: No     Diabetic: No     Tobacco smoker: No     Systolic Blood Pressure: A999333 mmHg     Is BP treated: No     HDL Cholesterol: 61.9 mg/dL     Total Cholesterol: 247 mg/dL  Consdier ct soronary ct scan for risk assessment if  would help decide about statin medication but for now advise healthy eating   and lifestyle.   Standley Brooking. Misael Mcgaha M.D.

## 2022-01-22 NOTE — Patient Instructions (Signed)
Good seeing you today . Continue lifestyle intervention healthy eating and exercise .   Healthy weight loss as possible.  The 10-year ASCVD risk score (Arnett DK, et al., 2019) is: 3.7%   Values used to calculate the score:     Age: 61 years     Sex: Female     Is Non-Hispanic African American: No     Diabetic: No     Tobacco smoker: No     Systolic Blood Pressure: 124 mmHg     Is BP treated: No     HDL Cholesterol: 61.9 mg/dL     Total Cholesterol: 247 mg/dL  Consdier ct soronary ct scan for risk assessment if  would help decide about statin medication but for now advise healthy eating   and lifestyle.

## 2022-01-23 ENCOUNTER — Encounter: Payer: Self-pay | Admitting: Internal Medicine

## 2022-01-24 NOTE — Progress Notes (Signed)
Cholesterol  better than last year . Thyroid blood count and kidney liver are normal  Blood sugar borderline  hg a1c in pre diabetic range  . Continue lifestyle intervention healthy eating and exercise .avoiding excess sugars and limiting processed carbohydrates  And plan yearly  labs to follow  The 10-year ASCVD risk score (Arnett DK, et al., 2019) is: 3.4%   Values used to calculate the score:     Age: 61 years     Sex: Female     Is Non-Hispanic African American: No     Diabetic: No     Tobacco smoker: No     Systolic Blood Pressure: 124 mmHg     Is BP treated: No     HDL Cholesterol: 63.7 mg/dL     Total Cholesterol: 220 mg/dL

## 2022-02-13 DIAGNOSIS — Z1231 Encounter for screening mammogram for malignant neoplasm of breast: Secondary | ICD-10-CM | POA: Diagnosis not present

## 2022-02-13 DIAGNOSIS — Z01419 Encounter for gynecological examination (general) (routine) without abnormal findings: Secondary | ICD-10-CM | POA: Diagnosis not present

## 2022-03-28 DIAGNOSIS — M7061 Trochanteric bursitis, right hip: Secondary | ICD-10-CM | POA: Diagnosis not present

## 2022-04-10 DIAGNOSIS — M542 Cervicalgia: Secondary | ICD-10-CM | POA: Diagnosis not present

## 2022-04-10 DIAGNOSIS — G894 Chronic pain syndrome: Secondary | ICD-10-CM | POA: Diagnosis not present

## 2022-05-25 ENCOUNTER — Other Ambulatory Visit: Payer: Self-pay | Admitting: Adult Health

## 2022-05-25 DIAGNOSIS — J988 Other specified respiratory disorders: Secondary | ICD-10-CM

## 2022-05-31 ENCOUNTER — Encounter: Payer: Self-pay | Admitting: Internal Medicine

## 2022-05-31 MED ORDER — ALBUTEROL SULFATE HFA 108 (90 BASE) MCG/ACT IN AERS: 2.0000 | INHALATION_SPRAY | Freq: Four times a day (QID) | RESPIRATORY_TRACT | 0 refills | Status: AC | PRN

## 2022-06-11 DIAGNOSIS — H6993 Unspecified Eustachian tube disorder, bilateral: Secondary | ICD-10-CM | POA: Diagnosis not present

## 2022-06-11 DIAGNOSIS — M26623 Arthralgia of bilateral temporomandibular joint: Secondary | ICD-10-CM | POA: Diagnosis not present

## 2022-06-20 ENCOUNTER — Encounter: Payer: Self-pay | Admitting: Internal Medicine

## 2022-06-21 MED ORDER — ONDANSETRON 4 MG PO TBDP: ORAL_TABLET | ORAL | 1 refills | Status: DC

## 2022-07-10 DIAGNOSIS — Z96652 Presence of left artificial knee joint: Secondary | ICD-10-CM | POA: Diagnosis not present

## 2022-07-10 DIAGNOSIS — M25562 Pain in left knee: Secondary | ICD-10-CM | POA: Diagnosis not present

## 2022-07-10 DIAGNOSIS — M7061 Trochanteric bursitis, right hip: Secondary | ICD-10-CM | POA: Diagnosis not present

## 2022-08-13 DIAGNOSIS — M542 Cervicalgia: Secondary | ICD-10-CM | POA: Diagnosis not present

## 2022-08-13 DIAGNOSIS — Z79891 Long term (current) use of opiate analgesic: Secondary | ICD-10-CM | POA: Diagnosis not present

## 2022-08-13 DIAGNOSIS — G894 Chronic pain syndrome: Secondary | ICD-10-CM | POA: Diagnosis not present

## 2022-09-02 ENCOUNTER — Encounter: Payer: Self-pay | Admitting: Internal Medicine

## 2022-09-03 ENCOUNTER — Other Ambulatory Visit: Payer: Self-pay

## 2022-09-03 MED ORDER — ONDANSETRON 4 MG PO TBDP: ORAL_TABLET | ORAL | 1 refills | Status: DC

## 2022-10-17 DIAGNOSIS — M7061 Trochanteric bursitis, right hip: Secondary | ICD-10-CM | POA: Diagnosis not present

## 2022-10-27 ENCOUNTER — Encounter: Payer: Self-pay | Admitting: Internal Medicine

## 2022-10-28 MED ORDER — ONDANSETRON 4 MG PO TBDP: ORAL_TABLET | ORAL | 1 refills | Status: DC

## 2022-11-19 ENCOUNTER — Encounter: Payer: Self-pay | Admitting: Internal Medicine

## 2022-11-19 ENCOUNTER — Other Ambulatory Visit: Payer: Self-pay | Admitting: Family

## 2022-11-19 MED ORDER — FLUCONAZOLE 150 MG PO TABS: 150.0000 mg | ORAL_TABLET | Freq: Every day | ORAL | 0 refills | Status: DC

## 2022-11-25 DIAGNOSIS — M7061 Trochanteric bursitis, right hip: Secondary | ICD-10-CM | POA: Diagnosis not present

## 2022-12-10 DIAGNOSIS — M25562 Pain in left knee: Secondary | ICD-10-CM | POA: Diagnosis not present

## 2022-12-10 DIAGNOSIS — Z79899 Other long term (current) drug therapy: Secondary | ICD-10-CM | POA: Diagnosis not present

## 2022-12-10 DIAGNOSIS — I788 Other diseases of capillaries: Secondary | ICD-10-CM | POA: Diagnosis not present

## 2022-12-10 DIAGNOSIS — Z5181 Encounter for therapeutic drug level monitoring: Secondary | ICD-10-CM | POA: Diagnosis not present

## 2022-12-10 DIAGNOSIS — M542 Cervicalgia: Secondary | ICD-10-CM | POA: Diagnosis not present

## 2022-12-10 DIAGNOSIS — Z79891 Long term (current) use of opiate analgesic: Secondary | ICD-10-CM | POA: Diagnosis not present

## 2022-12-10 DIAGNOSIS — G894 Chronic pain syndrome: Secondary | ICD-10-CM | POA: Diagnosis not present

## 2022-12-10 DIAGNOSIS — L821 Other seborrheic keratosis: Secondary | ICD-10-CM | POA: Diagnosis not present

## 2022-12-25 ENCOUNTER — Encounter: Payer: Self-pay | Admitting: Internal Medicine

## 2022-12-26 MED ORDER — ONDANSETRON 4 MG PO TBDP: ORAL_TABLET | ORAL | 1 refills | Status: DC

## 2022-12-26 NOTE — Telephone Encounter (Signed)
Last visit with me pv was 5 23   Needs a visit  ( yearly for any on going med )  But for now can refill 1 with 1 refill until can have visit virtual if just a med check or in person. For PV

## 2023-01-28 NOTE — Progress Notes (Signed)
Chief Complaint  Patient presents with   Annual Exam    HPI: Patient  BRAYDEN KIRKBY  62 y.o. comes in today for Preventive Health Care visit  No9 major change in health  Prilosec as needed .  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 No other bleeding just wanted to check  [place] to get motion nausea and will take odt zofran  Otherwise no ref medical issue   Health Maintenance  Topic Date Due   COVID-19 Vaccine (1) 02/14/2023 (Originally 12/03/1965)   MAMMOGRAM  05/01/2023 (Originally 11/11/2019)   PAP SMEAR-Modifier  05/01/2023 (Originally 09/15/2015)   Zoster Vaccines- Shingrix (1 of 2) 05/01/2023 (Originally 12/04/1979)   INFLUENZA VACCINE  04/03/2023   DTaP/Tdap/Td (4 - Td or Tdap) 03/23/2024   Colonoscopy  09/02/2030   Hepatitis C Screening  Completed   HIV Screening  Completed   HPV VACCINES  Aged Out   Health Maintenance Review LIFESTYLE:  Exercise:   2 miles 5 x per week  [place] chicken  Tobacco/ETS: n Alcohol: rare Sugar beverages:no diet coke  Sleep: 7-8  Drug use: no HH of  2  no pets outside chickens    ROS:  GREST of 12 system review negative except as per HPI   Past Medical History:  Diagnosis Date   Allergy    Asthma    Gallstones    IBS (irritable bowel syndrome)    Sinusitis     Past Surgical History:  Procedure Laterality Date   KNEE ARTHROSCOPY Left 04/06/2019   Procedure: IRRIGATION AND DEBRIDEMENT knee scope;  Surgeon: Eugenia Mcalpine, MD;  Location: [facility];  Service: Orthopedics;  Laterality: Left;   KNEE SURGERY Left 07/2016   TOTAL KNEE ARTHROPLASTY Left 11/24/2019    Family History  Problem Relation Age of Onset   Hyperlipidemia Mother        HT   Heart disease Father        stents died of cva     Social  History   Socioeconomic History   Marital status: Married    Spouse name: Not on file   Number of children: 2   Years of education: Not on file   Highest education level: Not on file  Occupational History   Occupation: Homemaker  Tobacco Use   Smoking status: Never   Smokeless tobacco: Never  Vaping Use   Vaping Use: Never used  Substance and Sexual Activity   Alcohol use: Yes    Comment: ocassionally   Drug use: No   Sexual activity: Yes    Partners: Male  Other Topics Concern   Not on file  Social History Narrative   orig fr [place]   hh of 2    1 pet dog.   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   g2p2 both daughters      Babysits grandchildren frequently   Social Determinants of Health   Financial Resource Strain: Low Risk  (01/29/2023)   Overall Financial Resource Strain (CARDIA)    Difficulty of Paying Living Expenses: Not hard at all  Food Insecurity: No Food Insecurity (01/29/2023)   Hunger  Vital Sign    Worried About Programme researcher, broadcasting/film/video in the Last Year: Never true    Ran Out of Food in the Last Year: Never true  Transportation Needs: No Transportation Needs (01/29/2023)   PRAPARE - Administrator, Civil Service (Medical): No    Lack of Transportation (Non-Medical): No  Physical Activity: Sufficiently Active (01/29/2023)   Exercise Vital Sign    Days of Exercise per Week: 5 days    Minutes of Exercise per Session: 60 min  Stress: No Stress Concern Present (01/29/2023)   [employer] of Occupational Health - Occupational Stress Questionnaire    Feeling of Stress : Not at all  Social Connections: Socially Integrated (01/29/2023)   Social Connection and Isolation Panel [NHANES]    Frequency of Communication with Friends and Family: More than three times a week    Frequency of Social Gatherings with Friends and Family: Once a week    Attends Religious Services: More than 4 times  per year    Active Member of [employer] or Organizations: Yes    Attends Engineer, structural: More than 4 times per year    Marital Status: Married    Outpatient Medications Prior to Visit  Medication Sig Dispense Refill   albuterol (VENTOLIN HFA) 108 (90 Base) MCG/ACT inhaler Inhale 2 puffs into the lungs every 6 (six) hours as needed for wheezing or shortness of breath. 8 g 0   omeprazole (PRILOSEC OTC) 20 MG tablet Take 20 mg by mouth as needed.     ondansetron (ZOFRAN-ODT) 4 MG disintegrating tablet DISSOLVE 1 TABLET(4 MG) ON THE TONGUE EVERY 6 HOURS AS NEEDED FOR NAUSEA OR VOMITING 30 tablet 1   traMADol (ULTRAM) 50 MG tablet      fluconazole (DIFLUCAN) 150 MG tablet Take 1 tablet (150 mg total) by mouth daily. (Patient not taking: Reported on 01/29/2023) 1 tablet 0   HYDROcodone bit-homatropine (HYCODAN) 5-1.5 MG/5ML syrup Take 5 mLs by mouth every 8 (eight) hours as needed for cough. 120 mL 0   pantoprazole (PROTONIX) 40 MG tablet TAKE 1 TABLET(40 MG) BY MOUTH DAILY (Patient not taking: Reported on 01/29/2023) 30 tablet 1   No facility-administered medications prior to visit.     EXAM:  BP 132/84 (BP Location: Left Arm, Patient Position: Sitting, Cuff Size: Large)   Pulse 81   Temp 98 F (36.7 C) (Oral)   Ht 5' 5.25" (1.657 m)   Wt 220 lb 3.2 oz (99.9 kg)   SpO2 96%   BMI 36.36 kg/m   Body mass index is 36.36 kg/m. Wt Readings from Last 3 Encounters:  01/29/23 220 lb 3.2 oz (99.9 kg)  01/22/22 212 lb 3.2 oz (96.3 kg)  08/10/21 211 lb (95.7 kg)    Physical Exam: Vital signs reviewed [id number] is a well-developed well-nourished alert cooperative    who appearsr stated age in no acute distress.  HEENT: normocephalic atraumatic , Eyes: PERRL EOM's full, conjunctiva clear, Nares: paten,t no deformity discharge or tenderness., Ears: no deformity EAC's clear TMs with normal landmarks. Mouth: clear OP, no lesions, edema.  Moist mucous membranes. Dentition in adequate  repair. NECK: supple without masses, thyromegaly or bruits. CHEST/PULM:  Clear to auscultation and percussion breath sounds equal no wheeze , rales or rhonchi. No chest wall deformities or tenderness. Breast: normal by inspection . No dimpling, discharge, masses, tenderness or discharge . CV: PMI is nondisplaced, S1 S2 no gallops, murmurs, rubs. Peripheral pulses are full without delay.No JVD .  ABDOMEN: Bowel sounds normal nontender  No guard or rebound, no hepato splenomegal no CVA tenderness.  Extremtities:  No clubbing cyanosis or edema, no acute joint swelling or redness no focal atrophy healed left knee from tka NEURO:  Oriented x3, cranial nerves 3-12 appear to be intact, no obvious focal weakness,gait within normal limits no abnormal reflexes or asymmetrical SKIN: No acute rashes normal turgor, right ring and mid finger with irreg superficial ecchymosis no petechia  otherwise  left goot old discoler lat foot( since had tka post op infection)  no edema  PSYCH: Oriented, good eye contact, no obvious depression anxiety, cognition and judgment appear normal. LN: no cervical axillary adenopathy  Lab Results  Component Value Date   WBC 8.6 01/29/2023   HGB 13.4 01/29/2023   HCT 41.3 01/29/2023   PLT 355.0 01/29/2023   GLUCOSE 96 01/29/2023   CHOL 205 (H) 01/29/2023   TRIG 124.0 01/29/2023   HDL 59.70 01/29/2023   LDLCALC 120 (H) 01/29/2023   ALT 20 01/29/2023   AST 20 01/29/2023   NA 139 01/29/2023   K 4.6 01/29/2023   CL 104 01/29/2023   CREATININE 0.88 01/29/2023   BUN 15 01/29/2023   CO2 29 01/29/2023   TSH 1.83 01/29/2023   INR 1.1 (H) 01/29/2023   HGBA1C 6.1 01/29/2023    BP Readings from Last 3 Encounters:  01/29/23 132/84  01/22/22 124/76  08/10/21 128/80    Lab updated advised reviewed with patient  fasting  ASSESSMENT AND PLAN:  Discussed the following assessment and plan:    ICD-10-CM   1. Visit for preventive health examination  Z00.00 CBC with  Differential/Platelet    Comprehensive metabolic panel    Lipid panel    TSH    Hemoglobin A1c    Protime-INR    APTT    2. Medication management  Z79.899 CBC with Differential/Platelet    Comprehensive metabolic panel    Lipid panel    TSH    Hemoglobin A1c    Protime-INR    APTT    3. Controlled restless legs syndrome  G25.81 CBC with Differential/Platelet    Comprehensive metabolic panel    Lipid panel    TSH    Hemoglobin A1c    Protime-INR    APTT    4. Mixed hyperlipidemia  E78.2 CBC with Differential/Platelet    Comprehensive metabolic panel    Lipid panel    TSH    Hemoglobin A1c    Protime-INR    APTT    5. Bruising  T14.8XXA CBC with Differential/Platelet    Comprehensive metabolic panel    Lipid panel    TSH    Hemoglobin A1c    Protime-INR    APTT    Slab monitoring and add coags to lab work  Will be up to dat on pap August and mammot also at gyne Return in about 1 year (around 01/29/2024) for depending on results.  Patient Care Team: Rafi Kenneth, Neta Mends, MD as PCP - General (Internal Medicine) Philip Aspen, DO as Consulting Physician (Obstetrics and Gynecology) Iva Boop, MD as Consulting Physician (Gastroenterology) Sheran Luz, MD as Consulting Physician (Physical Medicine and Rehabilitation) Patient Instructions  Good to see you today . It is possible that tramadol advil  heavy activity and aging skin is causing the easy bruising on extremities . But ok to take as needed if otherwise no worse .  Checking full set of labs     Neta Mends. Macio Kissoon M.D.

## 2023-01-29 ENCOUNTER — Encounter: Payer: Self-pay | Admitting: Internal Medicine

## 2023-01-29 ENCOUNTER — Ambulatory Visit (INDEPENDENT_AMBULATORY_CARE_PROVIDER_SITE_OTHER): Payer: BC Managed Care – PPO | Admitting: Internal Medicine

## 2023-01-29 VITALS — BP 132/84 | HR 81 | Temp 98.0°F | Ht 65.25 in | Wt 220.2 lb

## 2023-01-29 DIAGNOSIS — G2581 Restless legs syndrome: Secondary | ICD-10-CM | POA: Diagnosis not present

## 2023-01-29 DIAGNOSIS — Z79899 Other long term (current) drug therapy: Secondary | ICD-10-CM | POA: Diagnosis not present

## 2023-01-29 DIAGNOSIS — R58 Hemorrhage, not elsewhere classified: Secondary | ICD-10-CM | POA: Diagnosis not present

## 2023-01-29 DIAGNOSIS — T148XXA Other injury of unspecified body region, initial encounter: Secondary | ICD-10-CM

## 2023-01-29 DIAGNOSIS — Z0000 Encounter for general adult medical examination without abnormal findings: Secondary | ICD-10-CM

## 2023-01-29 DIAGNOSIS — E782 Mixed hyperlipidemia: Secondary | ICD-10-CM | POA: Diagnosis not present

## 2023-01-29 LAB — COMPREHENSIVE METABOLIC PANEL
ALT: 20 U/L (ref 0–35)
AST: 20 U/L (ref 0–37)
Albumin: 4 g/dL (ref 3.5–5.2)
Alkaline Phosphatase: 116 U/L (ref 39–117)
BUN: 15 mg/dL (ref 6–23)
CO2: 29 mEq/L (ref 19–32)
Calcium: 9.8 mg/dL (ref 8.4–10.5)
Chloride: 104 mEq/L (ref 96–112)
Creatinine, Ser: 0.88 mg/dL (ref 0.40–1.20)
GFR: 70.57 mL/min (ref >60.00)
Glucose, Bld: 96 mg/dL (ref 70–99)
Potassium: 4.6 mEq/L (ref 3.5–5.1)
Sodium: 139 mEq/L (ref 135–145)
Total Bilirubin: 0.6 mg/dL (ref 0.2–1.2)
Total Protein: 7.2 g/dL (ref 6.0–8.3)

## 2023-01-29 LAB — HEMOGLOBIN A1C: Hgb A1c MFr Bld: 6.1 % (ref 4.6–6.5)

## 2023-01-29 LAB — LIPID PANEL
Cholesterol: 205 mg/dL — ABNORMAL HIGH (ref 0–200)
HDL: 59.7 mg/dL (ref >39.00)
LDL Cholesterol: 120 mg/dL — ABNORMAL HIGH (ref 0–99)
NonHDL: 144.82
Total CHOL/HDL Ratio: 3
Triglycerides: 124 mg/dL (ref 0.0–149.0)
VLDL: 24.8 mg/dL (ref 0.0–40.0)

## 2023-01-29 LAB — CBC WITH DIFFERENTIAL/PLATELET
Basophils Absolute: 0.1 10*3/uL (ref 0.0–0.1)
Basophils Relative: 1.1 % (ref 0.0–3.0)
Eosinophils Absolute: 0.3 10*3/uL (ref 0.0–0.7)
Eosinophils Relative: 3.1 % (ref 0.0–5.0)
HCT: 41.3 % (ref 36.0–46.0)
Hemoglobin: 13.4 g/dL (ref 12.0–15.0)
Lymphocytes Relative: 21.3 % (ref 12.0–46.0)
Lymphs Abs: 1.8 10*3/uL (ref 0.7–4.0)
MCHC: 32.4 g/dL (ref 30.0–36.0)
MCV: 84 fl (ref 78.0–100.0)
Monocytes Absolute: 0.6 10*3/uL (ref 0.1–1.0)
Monocytes Relative: 6.5 % (ref 3.0–12.0)
Neutro Abs: 5.9 10*3/uL (ref 1.4–7.7)
Neutrophils Relative %: 68 % (ref 43.0–77.0)
Platelets: 355 10*3/uL (ref 150.0–400.0)
RBC: 4.92 Mil/uL (ref 3.87–5.11)
RDW: 13.8 % (ref 11.5–15.5)
WBC: 8.6 10*3/uL (ref 4.0–10.5)

## 2023-01-29 LAB — PROTIME-INR
INR: 1.1 ratio — ABNORMAL HIGH (ref 0.8–1.0)
Prothrombin Time: 11.4 s (ref 9.6–13.1)

## 2023-01-29 LAB — TSH: TSH: 1.83 u[IU]/mL (ref 0.35–5.50)

## 2023-01-29 LAB — APTT: aPTT: 29.6 s (ref 25.4–36.8)

## 2023-01-29 NOTE — Patient Instructions (Signed)
Good to see you today . It is possible that tramadol advil  heavy activity and aging skin is causing the easy bruising on extremities . But ok to take as needed if otherwise no worse .  Checking full set of labs

## 2023-02-03 ENCOUNTER — Encounter: Payer: Self-pay | Admitting: Internal Medicine

## 2023-02-04 NOTE — Progress Notes (Signed)
Blood results are normal of improved since last check ( lipid panel)  the  flag on the INR clotting study is insignificant clinically .  Platelet count is normal . No  evidence of bleeding disorder .   So Continue lifestyle intervention healthy eating and exercise . And yearly check  The 10-year ASCVD risk score (Arnett DK, et al., 2019) is: 4.2%   Values used to calculate the score:     Age: 62 years     Sex: Female     Is Non-Hispanic African American: No     Diabetic: No     Tobacco smoker: No     Systolic Blood Pressure: 132 mmHg     Is BP treated: No     HDL Cholesterol: 59.7 mg/dL     Total Cholesterol: 205 mg/dL

## 2023-02-04 NOTE — Telephone Encounter (Signed)
See result note   results are normal or clinically insignificant

## 2023-02-05 NOTE — Telephone Encounter (Signed)
Attempted to reach pt. Left a voicemail. See result notes.

## 2023-02-06 DIAGNOSIS — M7061 Trochanteric bursitis, right hip: Secondary | ICD-10-CM | POA: Diagnosis not present

## 2023-02-08 ENCOUNTER — Encounter: Payer: Self-pay | Admitting: Internal Medicine

## 2023-02-10 ENCOUNTER — Other Ambulatory Visit: Payer: Self-pay | Admitting: Family

## 2023-02-10 MED ORDER — ONDANSETRON 4 MG PO TBDP: ORAL_TABLET | ORAL | 1 refills | Status: DC

## 2023-03-14 DIAGNOSIS — M7061 Trochanteric bursitis, right hip: Secondary | ICD-10-CM | POA: Diagnosis not present

## 2023-03-26 DIAGNOSIS — J329 Chronic sinusitis, unspecified: Secondary | ICD-10-CM | POA: Diagnosis not present

## 2023-03-26 DIAGNOSIS — R0982 Postnasal drip: Secondary | ICD-10-CM | POA: Diagnosis not present

## 2023-04-08 DIAGNOSIS — Z79891 Long term (current) use of opiate analgesic: Secondary | ICD-10-CM | POA: Diagnosis not present

## 2023-04-08 DIAGNOSIS — G894 Chronic pain syndrome: Secondary | ICD-10-CM | POA: Diagnosis not present

## 2023-04-08 DIAGNOSIS — M722 Plantar fascial fibromatosis: Secondary | ICD-10-CM | POA: Diagnosis not present

## 2023-04-17 DIAGNOSIS — J329 Chronic sinusitis, unspecified: Secondary | ICD-10-CM | POA: Diagnosis not present

## 2023-04-27 ENCOUNTER — Encounter: Payer: Self-pay | Admitting: Internal Medicine

## 2023-04-28 MED ORDER — ONDANSETRON 4 MG PO TBDP: ORAL_TABLET | ORAL | 1 refills | Status: DC

## 2023-05-01 DIAGNOSIS — M7061 Trochanteric bursitis, right hip: Secondary | ICD-10-CM | POA: Diagnosis not present

## 2023-05-01 DIAGNOSIS — M25562 Pain in left knee: Secondary | ICD-10-CM | POA: Diagnosis not present

## 2023-06-12 DIAGNOSIS — M7061 Trochanteric bursitis, right hip: Secondary | ICD-10-CM | POA: Diagnosis not present

## 2023-06-25 ENCOUNTER — Encounter: Payer: Self-pay | Admitting: Internal Medicine

## 2023-06-25 MED ORDER — ONDANSETRON 4 MG PO TBDP: ORAL_TABLET | ORAL | 1 refills | Status: DC

## 2023-07-24 DIAGNOSIS — M7061 Trochanteric bursitis, right hip: Secondary | ICD-10-CM | POA: Diagnosis not present

## 2023-08-08 ENCOUNTER — Encounter: Payer: Self-pay | Admitting: Internal Medicine

## 2023-08-11 ENCOUNTER — Other Ambulatory Visit: Payer: Self-pay | Admitting: Family

## 2023-08-11 MED ORDER — ONDANSETRON 4 MG PO TBDP: ORAL_TABLET | ORAL | 1 refills | Status: DC

## 2023-08-14 DIAGNOSIS — G894 Chronic pain syndrome: Secondary | ICD-10-CM | POA: Diagnosis not present

## 2023-08-14 DIAGNOSIS — Z79891 Long term (current) use of opiate analgesic: Secondary | ICD-10-CM | POA: Diagnosis not present

## 2023-08-14 DIAGNOSIS — M722 Plantar fascial fibromatosis: Secondary | ICD-10-CM | POA: Diagnosis not present

## 2023-09-16 DIAGNOSIS — J0100 Acute maxillary sinusitis, unspecified: Secondary | ICD-10-CM | POA: Diagnosis not present

## 2023-09-18 DIAGNOSIS — M7061 Trochanteric bursitis, right hip: Secondary | ICD-10-CM | POA: Diagnosis not present

## 2023-09-28 ENCOUNTER — Other Ambulatory Visit: Payer: Self-pay | Admitting: Family

## 2023-09-30 ENCOUNTER — Telehealth: Payer: Self-pay

## 2023-09-30 MED ORDER — ONDANSETRON 4 MG PO TBDP: ORAL_TABLET | ORAL | 1 refills | Status: DC

## 2023-09-30 NOTE — Telephone Encounter (Signed)
Copied from CRM [id number]. Topic: Clinical - Medication Refill >> Sep 30, 2023 10:29 AM Ernst Spell wrote: Most Recent Primary Care Visit:  Provider: Berniece Andreas K  Department: [facility]  Visit Type: PHYSICAL  Date: 01/29/2023  Medication: ondansetron (ZOFRAN-ODT  Has the patient contacted their pharmacy? Yes  Patient stated that the pharmacy has reached out to the clinic 3x. She stated that she is in town and is hoping for it to be sent in today.   Is this the correct pharmacy for this prescription? Yes  [id number] [place], [place] - [address] AT [facility] [address] [facility] [address] & [facility] [address] [place] [address] Phone: [phone] Fax: [phone]

## 2023-09-30 NOTE — Addendum Note (Signed)
Addended by: Vickii Chafe on: 09/30/2023 11:33 AM   Modules accepted: Orders

## 2023-09-30 NOTE — Telephone Encounter (Signed)
Rx sent

## 2023-10-27 DIAGNOSIS — J018 Other acute sinusitis: Secondary | ICD-10-CM | POA: Diagnosis not present

## 2023-10-27 DIAGNOSIS — J019 Acute sinusitis, unspecified: Secondary | ICD-10-CM | POA: Diagnosis not present

## 2023-10-30 DIAGNOSIS — J0100 Acute maxillary sinusitis, unspecified: Secondary | ICD-10-CM | POA: Diagnosis not present

## 2023-11-03 DIAGNOSIS — J0191 Acute recurrent sinusitis, unspecified: Secondary | ICD-10-CM | POA: Diagnosis not present

## 2023-11-03 DIAGNOSIS — Z01419 Encounter for gynecological examination (general) (routine) without abnormal findings: Secondary | ICD-10-CM | POA: Diagnosis not present

## 2023-11-03 DIAGNOSIS — Z1231 Encounter for screening mammogram for malignant neoplasm of breast: Secondary | ICD-10-CM | POA: Diagnosis not present

## 2023-11-30 ENCOUNTER — Other Ambulatory Visit: Payer: Self-pay | Admitting: Internal Medicine

## 2023-11-30 ENCOUNTER — Encounter: Payer: Self-pay | Admitting: Internal Medicine

## 2023-12-15 DIAGNOSIS — Z96652 Presence of left artificial knee joint: Secondary | ICD-10-CM | POA: Diagnosis not present

## 2023-12-15 DIAGNOSIS — M7061 Trochanteric bursitis, right hip: Secondary | ICD-10-CM | POA: Diagnosis not present

## 2023-12-15 DIAGNOSIS — M79662 Pain in left lower leg: Secondary | ICD-10-CM | POA: Diagnosis not present

## 2023-12-15 DIAGNOSIS — L03116 Cellulitis of left lower limb: Secondary | ICD-10-CM | POA: Diagnosis not present

## 2023-12-19 ENCOUNTER — Other Ambulatory Visit (HOSPITAL_COMMUNITY): Payer: Self-pay | Admitting: Orthopedic Surgery

## 2023-12-19 DIAGNOSIS — M79605 Pain in left leg: Secondary | ICD-10-CM

## 2023-12-24 ENCOUNTER — Ambulatory Visit (HOSPITAL_COMMUNITY)
Admission: RE | Admit: 2023-12-24 | Discharge: 2023-12-24 | Disposition: A | Discharge status: Home / Self Care | Source: Ambulatory Visit | Attending: Cardiovascular Disease | Admitting: Cardiovascular Disease

## 2023-12-24 DIAGNOSIS — M79605 Pain in left leg: Secondary | ICD-10-CM | POA: Diagnosis not present

## 2023-12-30 DIAGNOSIS — G894 Chronic pain syndrome: Secondary | ICD-10-CM | POA: Diagnosis not present

## 2023-12-30 DIAGNOSIS — M722 Plantar fascial fibromatosis: Secondary | ICD-10-CM | POA: Diagnosis not present

## 2023-12-30 DIAGNOSIS — Z79891 Long term (current) use of opiate analgesic: Secondary | ICD-10-CM | POA: Diagnosis not present

## 2024-01-13 DIAGNOSIS — Z79899 Other long term (current) drug therapy: Secondary | ICD-10-CM | POA: Diagnosis not present

## 2024-01-20 ENCOUNTER — Encounter: Payer: Self-pay | Admitting: Internal Medicine

## 2024-01-21 MED ORDER — ONDANSETRON 4 MG PO TBDP: ORAL_TABLET | ORAL | 1 refills | Status: DC

## 2024-02-02 DIAGNOSIS — L821 Other seborrheic keratosis: Secondary | ICD-10-CM | POA: Diagnosis not present

## 2024-02-02 DIAGNOSIS — D225 Melanocytic nevi of trunk: Secondary | ICD-10-CM | POA: Diagnosis not present

## 2024-03-03 DIAGNOSIS — Z96652 Presence of left artificial knee joint: Secondary | ICD-10-CM | POA: Diagnosis not present

## 2024-03-03 DIAGNOSIS — Z471 Aftercare following joint replacement surgery: Secondary | ICD-10-CM | POA: Diagnosis not present

## 2024-03-03 DIAGNOSIS — M7989 Other specified soft tissue disorders: Secondary | ICD-10-CM | POA: Diagnosis not present

## 2024-03-17 DIAGNOSIS — M7061 Trochanteric bursitis, right hip: Secondary | ICD-10-CM | POA: Diagnosis not present

## 2024-04-26 DIAGNOSIS — G8929 Other chronic pain: Secondary | ICD-10-CM | POA: Diagnosis not present

## 2024-04-28 ENCOUNTER — Other Ambulatory Visit: Payer: Self-pay | Admitting: Internal Medicine

## 2024-05-20 ENCOUNTER — Encounter: Admitting: Internal Medicine

## 2024-06-09 ENCOUNTER — Encounter: Admitting: Internal Medicine

## 2024-06-23 ENCOUNTER — Encounter: Admitting: Internal Medicine

## 2024-06-23 ENCOUNTER — Encounter: Payer: Self-pay | Admitting: Internal Medicine

## 2024-06-24 MED ORDER — ONDANSETRON 4 MG PO TBDP: ORAL_TABLET | ORAL | 1 refills | Status: DC

## 2024-07-14 ENCOUNTER — Encounter: Admitting: Internal Medicine

## 2024-07-15 DIAGNOSIS — M7061 Trochanteric bursitis, right hip: Secondary | ICD-10-CM | POA: Diagnosis not present

## 2024-08-20 DIAGNOSIS — M542 Cervicalgia: Secondary | ICD-10-CM | POA: Diagnosis not present

## 2024-08-20 DIAGNOSIS — Z79891 Long term (current) use of opiate analgesic: Secondary | ICD-10-CM | POA: Diagnosis not present

## 2024-08-20 DIAGNOSIS — Z789 Other specified health status: Secondary | ICD-10-CM | POA: Diagnosis not present

## 2024-08-23 ENCOUNTER — Encounter: Payer: Self-pay | Admitting: Internal Medicine

## 2024-08-24 MED ORDER — ONDANSETRON 4 MG PO TBDP: ORAL_TABLET | ORAL | 0 refills | Status: DC

## 2024-09-09 ENCOUNTER — Encounter: Admitting: Internal Medicine

## 2024-09-09 ENCOUNTER — Encounter: Payer: Self-pay | Admitting: Internal Medicine

## 2024-09-13 MED ORDER — ONDANSETRON 4 MG PO TBDP: ORAL_TABLET | ORAL | 0 refills | Status: AC

## 2024-09-23 ENCOUNTER — Encounter: Admitting: Internal Medicine

## 2024-11-23 ENCOUNTER — Encounter: Payer: PRIVATE HEALTH INSURANCE | Admitting: Internal Medicine
